# Patient Record
Sex: Female | Born: 1950 | Race: White | Hispanic: No | Marital: Married | Smoking: Never smoker
Health system: Southern US, Community
[De-identification: ages and names within clinical notes are randomized; demographics above are authoritative.]

## PROBLEM LIST (undated history)

## (undated) DIAGNOSIS — I10 Essential (primary) hypertension: Secondary | ICD-10-CM

## (undated) DIAGNOSIS — G43909 Migraine, unspecified, not intractable, without status migrainosus: Secondary | ICD-10-CM

## (undated) HISTORY — PX: CARPAL TUNNEL RELEASE: SHX101

## (undated) HISTORY — PX: HAMMER TOE SURGERY: SHX385

## (undated) HISTORY — DX: Essential (primary) hypertension: I10

## (undated) HISTORY — PX: ABDOMINAL HYSTERECTOMY: SHX81

## (undated) HISTORY — PX: CHOLECYSTECTOMY: SHX55

## (undated) HISTORY — PX: TOTAL ABDOMINAL HYSTERECTOMY W/ BILATERAL SALPINGOOPHORECTOMY: SHX83

---

## 2002-01-01 LAB — HM COLONOSCOPY: HM Colonoscopy: NORMAL

## 2006-11-26 ENCOUNTER — Other Ambulatory Visit: Payer: Self-pay

## 2006-11-26 ENCOUNTER — Emergency Department: Payer: Self-pay | Admitting: Emergency Medicine

## 2007-12-13 ENCOUNTER — Ambulatory Visit: Payer: Self-pay | Admitting: Family Medicine

## 2010-03-19 LAB — HM DEXA SCAN

## 2010-09-01 ENCOUNTER — Ambulatory Visit: Payer: Self-pay | Admitting: Family Medicine

## 2010-09-04 LAB — HM MAMMOGRAPHY: HM Mammogram: NORMAL

## 2011-09-07 ENCOUNTER — Ambulatory Visit: Payer: Self-pay

## 2012-09-04 ENCOUNTER — Ambulatory Visit: Payer: Self-pay

## 2013-02-26 ENCOUNTER — Ambulatory Visit: Payer: Self-pay | Admitting: Unknown Physician Specialty

## 2013-02-26 LAB — HM COLONOSCOPY: HM Colonoscopy: NORMAL

## 2013-05-10 ENCOUNTER — Ambulatory Visit: Payer: Self-pay | Admitting: Podiatry

## 2014-02-25 ENCOUNTER — Ambulatory Visit: Payer: Self-pay

## 2014-06-25 ENCOUNTER — Encounter: Payer: Self-pay | Admitting: Emergency Medicine

## 2014-06-25 ENCOUNTER — Other Ambulatory Visit: Payer: Self-pay

## 2014-06-25 ENCOUNTER — Emergency Department
Admission: EM | Admit: 2014-06-25 | Discharge: 2014-06-25 | Disposition: A | Discharge status: Home / Self Care | Payer: Medicare PPO | Attending: Emergency Medicine | Admitting: Emergency Medicine

## 2014-06-25 DIAGNOSIS — J209 Acute bronchitis, unspecified: Secondary | ICD-10-CM | POA: Diagnosis not present

## 2014-06-25 DIAGNOSIS — R05 Cough: Secondary | ICD-10-CM | POA: Diagnosis present

## 2014-06-25 DIAGNOSIS — R200 Anesthesia of skin: Secondary | ICD-10-CM | POA: Diagnosis not present

## 2014-06-25 LAB — CBC
HCT: 44.1 % (ref 35.0–47.0)
Hemoglobin: 14.5 g/dL (ref 12.0–16.0)
MCH: 28.1 pg (ref 26.0–34.0)
MCHC: 32.9 g/dL (ref 32.0–36.0)
MCV: 85.3 fL (ref 80.0–100.0)
PLATELETS: 222 10*3/uL (ref 150–440)
RBC: 5.17 MIL/uL (ref 3.80–5.20)
RDW: 13 % (ref 11.5–14.5)
WBC: 9.6 10*3/uL (ref 3.6–11.0)

## 2014-06-25 LAB — BASIC METABOLIC PANEL
ANION GAP: 11 (ref 5–15)
BUN: 19 mg/dL (ref 6–20)
CALCIUM: 9.3 mg/dL (ref 8.9–10.3)
CHLORIDE: 105 mmol/L (ref 101–111)
CO2: 23 mmol/L (ref 22–32)
CREATININE: 1.08 mg/dL — AB (ref 0.44–1.00)
GFR calc Af Amer: >60 mL/min (ref >60)
GFR calc non Af Amer: 53 mL/min — ABNORMAL LOW (ref >60)
GLUCOSE: 112 mg/dL — AB (ref 65–99)
Potassium: 3.2 mmol/L — ABNORMAL LOW (ref 3.5–5.1)
Sodium: 139 mmol/L (ref 135–145)

## 2014-06-25 LAB — TROPONIN I: Troponin I: <0.03 ng/mL (ref <0.031)

## 2014-06-25 MED ORDER — AZITHROMYCIN 250 MG PO TABS: ORAL_TABLET | ORAL | Status: AC

## 2014-06-25 MED ORDER — BENZONATATE 100 MG PO CAPS: 100.0000 mg | ORAL_CAPSULE | Freq: Four times a day (QID) | ORAL | Status: DC | PRN

## 2014-06-25 NOTE — ED Notes (Signed)
[facility] .per orders statff she developed left sided facial numbness while getting a breathing treatment.Marland Kitchen.  Hx of panic attacks

## 2014-06-25 NOTE — ED Provider Notes (Signed)
[facility] Emergency Department Provider Note  Time seen: 3:16 PM  I have reviewed the triage vital signs and the nursing notes.   HISTORY  Chief Complaint Numbness    HPI [name] [name] is a 64 y.o. female who presents the emergency department with cough 4 days, which she states is getting worse. Patient was seen at [facility] clinic received an x-ray which looked normal and was given a breathing treatment. During the breathing treatment the patient's that her face felt tingly initially on the left side and then it spread to the right side as well. The physician at [facility] clinic was concerned so they sent her to the ER for further evaluation.Patient here denies any tingling or numbness of the face. States all symptoms have resolved once they stopped the breathing treatment. Patient denies fever, does state moderate cough 3-4 days. History of bronchitis in the past which this feels similar. Patient also does state for the last 2 days she has had a tightness/heaviness feeling in the chest which also concerned her. Describes it as very mild however. No exertional component, no nausea or diaphoresis.    History reviewed. No pertinent past medical history.  There are no active problems to display for this patient.   Past Surgical History  Procedure Laterality Date  . Abdominal hysterectomy      No current outpatient prescriptions on file.  Allergies Codeine  History reviewed. No pertinent family history.  Social History History  Substance Use Topics  . Smoking status: Never Smoker   . Smokeless tobacco: Not on file  . Alcohol Use: No    Review of Systems Constitutional: Negative for fever. Cardiovascular: Mild chest tightness Respiratory: Mild shortness of breath Gastrointestinal: Negative for abdominal pain, Musculoskeletal: Negative for back pain. Neurological: Tingling/numbness sensation to both sides for face during the breathing treatment  which has resolved since the breathing treatment stopped.  10-point ROS otherwise negative.  ____________________________________________   PHYSICAL EXAM:  VITAL SIGNS: ED Triage Vitals  Enc Vitals Group     BP 06/25/14 1305 117/60 mmHg     Pulse Rate 06/25/14 1305 74     Resp 06/25/14 1305 24     Temp 06/25/14 1306 97.7 F (36.5 C)     Temp Source 06/25/14 1306 Oral     SpO2 06/25/14 1305 99 %     Weight 06/25/14 1305 210 lb (95.255 kg)     Height 06/25/14 1305  (1.651 m)     Head Cir --      Peak Flow --      Pain Score --      Pain Loc --      Pain Edu? --      Excl. in GC? --     Constitutional: Alert and oriented. Well appearing and in no distress. Eyes: Normal exam, PERRL ENT   Mouth/Throat: Mucous membranes are moist. Cardiovascular: Normal rate, regular rhythm. No murmurs Respiratory: Normal respiratory effort without tachypnea nor retractions. Breath sounds are clear  Gastrointestinal: Soft and nontender. No distention. Musculoskeletal: Nontender with normal range of motion in all extremities. Neurologic:  Normal speech and language. No gross focal neurologic deficits are appreciated. Speech is normal. Denies any sensory deficits at this time. Skin:  Skin is warm, dry and intact.  Psychiatric: Mood and affect are normal. Speech and behavior are normal.  ____________________________________________    EKG  EKG reviewed and interpreted by myself shows normal sinus rhythm at 76 bpm, narrow QRS, normal  axis, normal intervals, nonspecific but no concerning ST changes noted.  ____________________________________________    INITIAL IMPRESSION / ASSESSMENT AND PLAN / ED COURSE  Pertinent labs & imaging results that were available during my care of the patient were reviewed by me and considered in my medical decision making (see chart for details).  Patient with likely acute bronchitis. Facial tingling likely secondary to albuterol. Denies any  neurologic deficits since the albuterol was stopped. Patient appears very well with a normal physical exam besides a dry cough. We'll treat the patient's acute bronchitis never follow up with her primary care doctor.  ____________________________________________   FINAL CLINICAL IMPRESSION(S) / ED DIAGNOSES  Acute bronchitis Paresthesias   Minna AntisKevin Akshay Spang, MD 06/25/14 1521

## 2014-06-25 NOTE — Discharge Instructions (Signed)

## 2014-07-16 DIAGNOSIS — M810 Age-related osteoporosis without current pathological fracture: Secondary | ICD-10-CM | POA: Insufficient documentation

## 2014-07-16 DIAGNOSIS — F329 Major depressive disorder, single episode, unspecified: Secondary | ICD-10-CM | POA: Insufficient documentation

## 2014-07-16 DIAGNOSIS — E785 Hyperlipidemia, unspecified: Secondary | ICD-10-CM | POA: Insufficient documentation

## 2014-07-16 DIAGNOSIS — J309 Allergic rhinitis, unspecified: Secondary | ICD-10-CM | POA: Insufficient documentation

## 2014-07-16 DIAGNOSIS — N3281 Overactive bladder: Secondary | ICD-10-CM | POA: Insufficient documentation

## 2014-07-16 DIAGNOSIS — F32A Depression, unspecified: Secondary | ICD-10-CM | POA: Insufficient documentation

## 2014-07-16 DIAGNOSIS — I10 Essential (primary) hypertension: Secondary | ICD-10-CM | POA: Insufficient documentation

## 2014-07-19 ENCOUNTER — Ambulatory Visit (INDEPENDENT_AMBULATORY_CARE_PROVIDER_SITE_OTHER): Payer: Medicare PPO | Admitting: Family Medicine

## 2014-07-19 ENCOUNTER — Encounter: Payer: Self-pay | Admitting: Unknown Physician Specialty

## 2014-07-19 VITALS — BP 135/88 | HR 83 | Temp 98.2°F | Ht 63.5 in | Wt 210.0 lb

## 2014-07-19 DIAGNOSIS — J40 Bronchitis, not specified as acute or chronic: Secondary | ICD-10-CM | POA: Diagnosis not present

## 2014-07-19 MED ORDER — DOXYCYCLINE HYCLATE 100 MG PO TABS: 100.0000 mg | ORAL_TABLET | Freq: Two times a day (BID) | ORAL | Status: DC

## 2014-07-19 NOTE — Progress Notes (Signed)
  BP 135/88 mmHg  Pulse 83  Temp(Src) 98.2 F (36.8 C)  Ht 5' 3.5" (1.613 m)  Wt 210 lb (95.255 kg)  BMI 36.61 kg/m2  SpO2 97%   Subjective:    Patient ID: [name] [name], female    DOB: [birth year], 64 y.o.   MRN: [id number]  HPI: [name] [name] is a 64 y.o. female  Chief Complaint  Patient presents with  . URI    has been treated for bronchitis, wants to be rechecked   UPPER RESPIRATORY TRACT INFECTION- had a nasty case of bronchitis and got a breathing treatment, had a bad reaction to it and had to go to the ER for that due to an allergy. Has been sick for 4 weeks, feeling better this week Worst symptom: cough- better now than it was Fever: no Cough: yes Shortness of breath: no Wheezing: yes Chest pain: no Chest tightness: no Chest congestion: no Nasal congestion: yes Runny nose: yes Post nasal drip: no Sneezing: yes Sore throat: yes Swollen glands: yes Sinus pressure: yes Headache: no Face pain: no Toothache: no Ear pain: no  Ear pressure: no  Eyes red/itching:no Eye drainage/crusting: no  Vomiting: no Rash: no Fatigue: no Sick contacts: no Strep contacts: no  Context: better, worse, stable and fluctuating Recurrent sinusitis: no Relief with OTC cold/cough medications: no  Treatments attempted: antibiotics   Relevant past medical, surgical, family and social history reviewed and updated as indicated. Interim medical history since our last visit reviewed. Allergies and medications reviewed and updated.  Review of Systems  Constitutional: Negative.   HENT: Negative.   Respiratory: Negative.   Cardiovascular: Negative.   Psychiatric/Behavioral: Negative.     Per HPI unless specifically indicated above     Objective:    BP 135/88 mmHg  Pulse 83  Temp(Src) 98.2 F (36.8 C)  Ht 5' 3.5" (1.613 m)  Wt 210 lb (95.255 kg)  BMI 36.61 kg/m2  SpO2 97%  Wt Readings from Last 3 Encounters:  07/19/14 210 lb (95.255 kg)  April 05, 2049 212 lb (96.163 kg) (100  %*, Z = 48.93)  06/25/14 210 lb (95.255 kg)   * Growth percentiles are based on WHO (Girls, 0-2 years) data.    Physical Exam  Constitutional: She appears well-developed and well-nourished.  HENT:  Head: Normocephalic and atraumatic.  Cardiovascular: Normal rate, regular rhythm and normal heart sounds.  Exam reveals no gallop and no friction rub.   No murmur heard. Pulmonary/Chest: Effort normal. No respiratory distress. She has no wheezes. She has no rales. She exhibits no tenderness.  Coarse breath sounds at the bases bilaterally  Skin: Skin is warm and dry.  Psychiatric: She has a normal mood and affect. Her behavior is normal. Judgment and thought content normal.  Nursing note and vitals reviewed.       Assessment & Plan:   Problem List Items Addressed This Visit    None    Visit Diagnoses    Bronchitis    -  Primary    Still coughing with course breath sounds. Will start doxycycline. Call if not doing better. Return for lung recheck in 2 weeks.         Follow up plan: Return in about 2 weeks (around 08/02/2014) for Lung recheck.

## 2014-07-19 NOTE — Patient Instructions (Signed)
Cough, Adult   A cough is a reflex. It helps you clear your throat and airways. A cough can help heal your body. A cough can last 2 or 3 weeks (acute) or may last more than 8 weeks (chronic). Some common causes of a cough can include an infection, allergy, or a cold.  HOME CARE  · Only take medicine as told by your doctor.  · If given, take your medicines (antibiotics) as told. Finish them even if you start to feel better.  · Use a cold steam vaporizer or humidifier in your home. This can help loosen thick spit (secretions).  · Sleep so you are almost sitting up (semi-upright). Use pillows to do this. This helps reduce coughing.  · Rest as needed.  · Stop smoking if you smoke.  GET HELP RIGHT AWAY IF:  · You have yellowish-white fluid (pus) in your thick spit.  · Your cough gets worse.  · Your medicine does not reduce coughing, and you are losing sleep.  · You cough up blood.  · You have trouble breathing.  · Your pain gets worse and medicine does not help.  · You have a fever.  MAKE SURE YOU:   · Understand these instructions.  · Will watch your condition.  · Will get help right away if you are not doing well or get worse.  Document Released: 10/01/2010 Document Revised: 06/04/2013 Document Reviewed: 10/01/2010  ExitCare® Patient Information ©2015 [employer]. This information is not intended to replace advice given to you by your health care provider. Make sure you discuss any questions you have with your health care provider.

## 2014-07-22 ENCOUNTER — Telehealth: Payer: Self-pay

## 2014-07-22 NOTE — Telephone Encounter (Signed)
Patient would like to speak with MAC about "incident" this weekend

## 2014-07-23 ENCOUNTER — Emergency Department: Payer: Medicare PPO

## 2014-07-23 ENCOUNTER — Emergency Department
Admission: EM | Admit: 2014-07-23 | Discharge: 2014-07-23 | Disposition: A | Discharge status: Home / Self Care | Payer: Medicare PPO | Attending: Emergency Medicine | Admitting: Emergency Medicine

## 2014-07-23 ENCOUNTER — Ambulatory Visit (INDEPENDENT_AMBULATORY_CARE_PROVIDER_SITE_OTHER): Payer: Medicare PPO | Admitting: Family Medicine

## 2014-07-23 ENCOUNTER — Encounter: Payer: Self-pay | Admitting: Family Medicine

## 2014-07-23 VITALS — BP 137/85 | HR 82 | Temp 98.2°F | Ht 63.2 in | Wt 208.8 lb

## 2014-07-23 DIAGNOSIS — Y9389 Activity, other specified: Secondary | ICD-10-CM | POA: Insufficient documentation

## 2014-07-23 DIAGNOSIS — Y9289 Other specified places as the place of occurrence of the external cause: Secondary | ICD-10-CM | POA: Insufficient documentation

## 2014-07-23 DIAGNOSIS — R079 Chest pain, unspecified: Secondary | ICD-10-CM | POA: Diagnosis present

## 2014-07-23 DIAGNOSIS — R5383 Other fatigue: Secondary | ICD-10-CM

## 2014-07-23 DIAGNOSIS — Y998 Other external cause status: Secondary | ICD-10-CM | POA: Diagnosis not present

## 2014-07-23 DIAGNOSIS — I10 Essential (primary) hypertension: Secondary | ICD-10-CM | POA: Diagnosis not present

## 2014-07-23 DIAGNOSIS — T675XXA Heat exhaustion, unspecified, initial encounter: Secondary | ICD-10-CM | POA: Diagnosis not present

## 2014-07-23 DIAGNOSIS — X30XXXA Exposure to excessive natural heat, initial encounter: Secondary | ICD-10-CM | POA: Diagnosis not present

## 2014-07-23 DIAGNOSIS — R059 Cough, unspecified: Secondary | ICD-10-CM

## 2014-07-23 DIAGNOSIS — R9431 Abnormal electrocardiogram [ECG] [EKG]: Secondary | ICD-10-CM | POA: Diagnosis not present

## 2014-07-23 DIAGNOSIS — R05 Cough: Secondary | ICD-10-CM | POA: Diagnosis not present

## 2014-07-23 DIAGNOSIS — Z79899 Other long term (current) drug therapy: Secondary | ICD-10-CM | POA: Diagnosis not present

## 2014-07-23 DIAGNOSIS — R4182 Altered mental status, unspecified: Secondary | ICD-10-CM | POA: Insufficient documentation

## 2014-07-23 DIAGNOSIS — R404 Transient alteration of awareness: Secondary | ICD-10-CM | POA: Diagnosis not present

## 2014-07-23 DIAGNOSIS — R0789 Other chest pain: Secondary | ICD-10-CM

## 2014-07-23 LAB — CBC
HEMATOCRIT: 43.4 % (ref 35.0–47.0)
Hemoglobin: 14.2 g/dL (ref 12.0–16.0)
MCH: 28.1 pg (ref 26.0–34.0)
MCHC: 32.8 g/dL (ref 32.0–36.0)
MCV: 85.6 fL (ref 80.0–100.0)
Platelets: 213 10*3/uL (ref 150–440)
RBC: 5.07 MIL/uL (ref 3.80–5.20)
RDW: 13.4 % (ref 11.5–14.5)
WBC: 8 10*3/uL (ref 3.6–11.0)

## 2014-07-23 LAB — BASIC METABOLIC PANEL
ANION GAP: 10 (ref 5–15)
BUN: 22 mg/dL — ABNORMAL HIGH (ref 6–20)
CO2: 25 mmol/L (ref 22–32)
Calcium: 9.1 mg/dL (ref 8.9–10.3)
Chloride: 102 mmol/L (ref 101–111)
Creatinine, Ser: 0.92 mg/dL (ref 0.44–1.00)
GFR calc Af Amer: >60 mL/min (ref >60)
GFR calc non Af Amer: >60 mL/min (ref >60)
Glucose, Bld: 94 mg/dL (ref 65–99)
POTASSIUM: 3.5 mmol/L (ref 3.5–5.1)
SODIUM: 137 mmol/L (ref 135–145)

## 2014-07-23 LAB — UA/M W/RFLX CULTURE, ROUTINE
Bilirubin, UA: NEGATIVE
GLUCOSE, UA: NEGATIVE
KETONES UA: NEGATIVE
LEUKOCYTES UA: NEGATIVE
NITRITE UA: NEGATIVE
Protein, UA: NEGATIVE
RBC UA: NEGATIVE
Specific Gravity, UA: 1.03 (ref 1.005–1.030)
Urobilinogen, Ur: 0.2 mg/dL (ref 0.2–1.0)
pH, UA: 5.5 (ref 5.0–7.5)

## 2014-07-23 LAB — BAYER DCA HB A1C WAIVED: HB A1C (BAYER DCA - WAIVED): 5.5 % (ref <7.0)

## 2014-07-23 LAB — TROPONIN I: Troponin I: <0.03 ng/mL (ref <0.031)

## 2014-07-23 MED ORDER — ASPIRIN 325 MG PO TABS: 325.0000 mg | ORAL_TABLET | Freq: Once | ORAL | Status: DC

## 2014-07-23 MED ORDER — DOXYCYCLINE HYCLATE 100 MG PO TABS: 100.0000 mg | ORAL_TABLET | Freq: Two times a day (BID) | ORAL | Status: DC

## 2014-07-23 NOTE — Progress Notes (Signed)
BP 137/85 mmHg  Pulse 82  Temp(Src) 98.2 F (36.8 C)  Ht 5' 3.2" (1.605 m)  Wt 208 lb 12.8 oz (94.711 kg)  BMI 36.77 kg/m2  SpO2 96%   Subjective:    Patient ID: [name] [name], female    DOB: [birth year], 64 y.o.   MRN: [id number]  HPI: [name] [name] is a 64 y.o. female  Chief Complaint  Patient presents with  . Cough    Patient complains of fatigue, vomiting and confusing on Saturday. She can not remember anything about Saturday  . Altered Mental Status   Husband got a call from her son on Saturday. She was very confused. Didn't know what day it was. Doesn't remember anything about that happening. Had been out in sun for about 3-4 hours without eating anything. Nothing like this has ever happened before. Lasted about 3-4 hours. Remembers getting up in the morning, remembers going outside and then remembers nothing until she was woken up in the evening. Her son came over and she had thrown up and said that she was very hot.   Cough Fever: no Cough: yes Shortness of breath: no Wheezing: no Chest pain: yes Chest tightness: yes Chest congestion: yes Nasal congestion: no Runny nose: no Post nasal drip: no Sneezing: no Sore throat: no Swollen glands: no Sinus pressure: no Headache: no Face pain: no Toothache: no Ear pain: no  Ear pressure: no  Eyes red/itching:no Eye drainage/crusting: no  Vomiting: no Rash: no Fatigue: yes Sick contacts: no Strep contacts: no  Context: stable Recurrent sinusitis: no Relief with OTC cold/cough medications: no  Treatments attempted: cold/sinus, mucinex, anti-histamine and antibiotics   CHEST PAIN Time since onset: Duration:today about 2 hours ago Onset: sudden Quality: pressure-like Severity: moderate Location: substernal Radiation: none Episode duration: has been pretty constant for the past 2 hours Frequency: constant Related to exertion: no Activity when pain started: sitting, then went to sleep Trauma:  no Anxiety/recent stressors: yes Status: stable Treatments attempted: nothing  Current pain status: in pain Shortness of breath: yes Cough: yes Nausea: yes Diaphoresis: no Heartburn: no Palpitations: no  Relevant past medical, surgical, family and social history reviewed and updated as indicated. Interim medical history since our last visit reviewed. Allergies and medications reviewed and updated.  Review of Systems  Constitutional: Negative.   HENT: Negative.   Respiratory: Negative.   Cardiovascular: Negative.   Psychiatric/Behavioral: Negative.     Per HPI unless specifically indicated above     Objective:    BP 137/85 mmHg  Pulse 82  Temp(Src) 98.2 F (36.8 C)  Ht 5' 3.2" (1.605 m)  Wt 208 lb 12.8 oz (94.711 kg)  BMI 36.77 kg/m2  SpO2 96%  Wt Readings from Last 3 Encounters:  07/23/14 208 lb 12.8 oz (94.711 kg)  07/19/14 210 lb (95.255 kg)  2049/09/16 212 lb (96.163 kg) (100 %*, Z = 48.93)   * Growth percentiles are based on WHO (Girls, 0-2 years) data.    Physical Exam  Constitutional: She appears well-developed and well-nourished.  HENT:  Head: Normocephalic and atraumatic.  Cardiovascular: Normal rate, regular rhythm and normal heart sounds.  Exam reveals no gallop and no friction rub.   No murmur heard. Pulmonary/Chest: Effort normal and breath sounds normal. No respiratory distress. She has no wheezes. She has no rales. She exhibits no tenderness.  Skin: Skin is warm and dry.  Psychiatric: She has a normal mood and affect. Her behavior is normal. Judgment and thought content normal.  Nursing note and vitals reviewed.   Results for orders placed or performed during the hospital encounter of 06/25/14  CBC  Result Value Ref Range   WBC 9.6 3.6 - 11.0 K/uL   RBC 5.17 3.80 - 5.20 MIL/uL   Hemoglobin 14.5 12.0 - 16.0 g/dL   HCT 16.1 09.6 - 04.5 %   MCV 85.3 80.0 - 100.0 fL   MCH 28.1 26.0 - 34.0 pg   MCHC 32.9 32.0 - 36.0 g/dL   RDW 40.9 81.1 - 91.4 %    Platelets 222 150 - 440 K/uL  Basic metabolic panel  Result Value Ref Range   Sodium 139 135 - 145 mmol/L   Potassium 3.2 (L) 3.5 - 5.1 mmol/L   Chloride 105 101 - 111 mmol/L   CO2 23 22 - 32 mmol/L   Glucose, Bld 112 (H) 65 - 99 mg/dL   BUN 19 6 - 20 mg/dL   Creatinine, Ser 7.82 (H) 0.44 - 1.00 mg/dL   Calcium 9.3 8.9 - 95.6 mg/dL   GFR calc non Af Amer 53 (L) >60 mL/min   GFR calc Af Amer >60 >60 mL/min   Anion gap 11 5 - 15  Troponin I  Result Value Ref Range   Troponin I <0.03 <0.031 ng/mL      Assessment & Plan:   Problem List Items Addressed This Visit      Other   Altered mental status - Primary    Patient had altered mental status for at least 4 hours on Saturday and presents for evaluation for it today. Will check lab work as listed below and obtain EKG. Will need CT of her head- however due to change in EKG, patient is going to ER now, so will hold on CT ordered here. Will hold on neuro consult pending her lab work. Differential includes heat stroke, CVA, TIA, MI, mass effect.       Relevant Orders   Comprehensive metabolic panel   Bayer DCA Hb O1H Waived   TSH   Vit D  25 hydroxy (rtn osteoporosis monitoring)   CBC With Differential/Platelet   CT Head Wo Contrast   UA/M w/rflx Culture, Routine   Chest pain    Patient presents today with chest pain x 2 hours following AMS x 4 hours on Saturday. EKG done in the office today shows ST depression. Patient given  of PO aspirin to chew and rescue was called. Patient to ER for evaluation.       Relevant Medications   aspirin tablet 325 mg (Start on 07/23/2014  2:30 PM)    Other Visit Diagnoses    Cough        only on day 4 of doxy. Lungs improved today. Continue to monitor.         Follow up plan: Return Pending hospital stay.

## 2014-07-23 NOTE — ED Notes (Signed)
Pt arrives by [facility] from PCP in [place] after having CP today. Told that her EKG was abnormal, [facility] was unable to elaborate on that due to not being told about abnormal EKG. Pt dx with bronchitis last week and has been taking medications as direction. Pt alert and oriented X4, active, cooperative, pt in NAD. RR even and unlabored, color WNL.  Denies pain at this time.

## 2014-07-23 NOTE — ED Notes (Signed)
MD at bedside. 

## 2014-07-23 NOTE — Discharge Instructions (Signed)
Please seek medical attention for any high fevers, chest pain, shortness of breath, change in behavior, persistent vomiting, bloody stool or any other new or concerning symptoms.  Heat-Related Illness Heat-related illnesses occur when the body is unable to properly cool itself. The body normally cools itself by sweating. However, under some conditions sweating is not enough. In these cases, a person's body temperature rises rapidly. Very high body temperatures may damage the brain or other vital organs. Some examples of heat-related illnesses include:  Heat stroke. This occurs when the body is unable to regulate its temperature. The body's temperature rises rapidly, the sweating mechanism fails, and the body is unable to cool down. Body temperature may rise to 106 F (41 C) or higher within 10 to 15 minutes. Heat stroke can cause death or permanent disability if emergency treatment is not provided.  Heat exhaustion. This is a milder form of heat-related illness that can develop after several days of exposure to high temperatures and not enough fluids. It is the body's response to an excessive loss of the water and salt contained in sweat.  Heat cramps. These usually affect people who sweat a lot during heavy activity. This sweating drains the body's salt and moisture. The low salt level in the muscles causes painful cramps. Heat cramps may also be a symptom of heat exhaustion. Heat cramps usually occur in the abdomen, arms, or legs. Get medical attention for cramps if you have heart problems or are on a low-sodium diet. Those that are at greatest risk for heat-related illnesses include:   The elderly.  Infant and the very young.  People with mental illness and chronic diseases.  People who are overweight (obese).  Young and healthy people can even succumb to heat if they participate in strenuous physical activities during hot weather. CAUSES  Several factors affect the body's ability to cool  itself during extremely hot weather. When the humidity is high, sweat will not evaporate as quickly. This prevents the body from releasing heat quickly. Other factors that can affect the body's ability to cool down include:   Age.  Obesity.  Fever.  Dehydration.  Heart disease.  Mental illness.  Poor circulation.  Sunburn.  Prescription drug use.  Alcohol use. SYMPTOMS  Heat stroke: Warning signs of heat stroke vary, but may include:  An extremely high body temperature (above 103F orally).  A fast, strong pulse.  Dizziness.  Confusion.  Red, hot, and dry skin.  No sweating.  Throbbing headache.  Feeling sick to your stomach (nauseous).  Unconsciousness. Heat exhaustion: Warning signs of heat exhaustion include:  Heavy sweating.  Tiredness.  Headache.  Paleness.  Weakness.  Feeling sick to your stomach (nauseous) or vomiting.  Muscle cramps. Heat cramps  Muscle pains or spasms. TREATMENT  Heat stroke  Get into a cool environment. An indoor place that is air-conditioned may be best.  Take a cool shower or bath. Have someone around to make sure you are okay.  Take your temperature. Make sure it is going down. Heat exhaustion  Drink plenty of fluids. Do not drink liquids that contain caffeine, alcohol, or large amounts of sugar. These cause you to lose more body fluid. Also, avoid very cold drinks. They can cause stomach cramps.  Get into a cool environment. An indoor place that is air-conditioned may be best.  Take a cool shower or bath. Have someone around to make sure you are okay.  Put on lightweight clothing. Heat cramps  Stop whatever activity you  were doing. Do not attempt to do that activity for at least 3 hours after the cramps have gone away. °· Get into a cool environment. An indoor place that is air-conditioned may be best. °HOME CARE INSTRUCTIONS  °To protect your health when temperatures are extremely high, follow these  tips: °· During heavy exercise in a hot environment, drink two to four glasses (16-32 ounces) of cool fluids each hour. Do not wait until you are thirsty to drink. Warning: If your caregiver limits the amount of fluid you drink or has you on water pills, ask how much you should drink while the weather is hot. °· Do not drink liquids that contain caffeine, alcohol, or large amounts of sugar. These cause you to lose more body fluid. °· Avoid very cold drinks. They can cause stomach cramps. °· Wear appropriate clothing. Choose lightweight, light-colored, loose-fitting clothing. °· If you must be outdoors, try to limit your outdoor activity to morning and evening hours. Try to rest often in shady areas. °· If you are not used to working or exercising in a hot environment, start slowly and pick up the pace gradually. °· Stay cool in an air-conditioned place if possible. If your home does not have air conditioning, go to the shopping mall or public library. °· Taking a cool shower or bath may help you cool off. °SEEK MEDICAL CARE IF:  °· You see any of the symptoms listed above. You may be dealing with a life-threatening emergency. °· Symptoms worsen or last longer than 1 hour. °· Heat cramps do not get better in 1 hour. °MAKE SURE YOU:  °· Understand these instructions. °· Will watch your condition. °· Will get help right away if you are not doing well or get worse. °Document Released: 10/28/2007 Document Revised: 04/12/2011 Document Reviewed: 10/28/2007 °ExitCare® Patient Information ©2015 [employer]. This information is not intended to replace advice given to you by your health care provider. Make sure you discuss any questions you have with your health care provider. ° °

## 2014-07-23 NOTE — Patient Instructions (Addendum)
If you become confused again- go IMMEDIATELY to the emergency room for evaluation.  We checked your blood work today, but much of it will not come back until tomorrow. We will call you with the results.  You will need a CT of your head to make sure you didn't have a stroke or a mini-stroke. We are getting this scheduled for you. You may need to see the neurologist- this will depend on the results of your blood work. We will discuss this by phone tomorrow.  This could be many different things from problems with your electrolytes, to a stroke or mini-stroke, to heat stroke, but there are things we need to check on and make sure it was not.  Keep taking your antibiotics until you're done. It can make you more sensitive to the sun, so try to stay out of it for the next several days.   Confusion Confusion is the inability to think with your usual speed or clarity. Confusion may come on quickly or slowly over time. How quickly the confusion comes on depends on the cause. Confusion can be due to any number of causes. CAUSES   Concussion, head injury, or head trauma.  Seizures.  Stroke.  Fever.  Brain tumor.  Age related decreased brain function (dementia).  Heightened emotional states like rage or terror.  Mental illness in which the person loses the ability to determine what is real and what is not (hallucinations).  Infections such as a urinary tract infection (UTI).  Toxic effects from alcohol, drugs, or prescription medicines.  Dehydration and an imbalance of salts in the body (electrolytes).  Lack of sleep.  Low blood sugar (diabetes).  Low levels of oxygen from conditions such as chronic lung disorders.  Drug interactions or other medicine side effects.  Nutritional deficiencies, especially niacin, thiamine, vitamin C, or vitamin B.  Sudden drop in body temperature (hypothermia).  Change in routine, such as when traveling or hospitalized. SIGNS AND SYMPTOMS  People often  describe their thinking as cloudy or unclear when they are confused. Confusion can also include feeling disoriented. That means you are unaware of where or who you are. You may also not know what the date or time is. If confused, you may also have difficulty paying attention, remembering, and making decisions. Some people also act aggressively when they are confused.  DIAGNOSIS  The medical evaluation of confusion may include:  Blood and urine tests.  X-rays.  Brain and nervous system tests.  Analyzing your brain waves (electroencephalogram or EEG).  Magnetic resonance imaging (MRI) of your head.  Computed tomography (CT) scan of your head.  Mental status tests in which your health care provider may ask many questions. Some of these questions may seem silly or strange, but they are a very important test to help diagnose and treat confusion. TREATMENT  An admission to the hospital may not be needed, but a person with confusion should not be left alone. Stay with a family member or friend until the confusion clears. Avoid alcohol, pain relievers, or sedative drugs until you have fully recovered. Do not drive until directed by your health care provider. HOME CARE INSTRUCTIONS  What family and friends can do:  To find out if someone is confused, ask the person to state his or her name, age, and the date. If the person is unsure or answers incorrectly, he or she is confused.  Always introduce yourself, no matter how well the person knows you.  Often remind the person  of his or her location.  Place a calendar and clock near the confused person.  Help the person with his or her medicines. You may want to use a pill box, an alarm as a reminder, or give the person each dose as prescribed.  Talk about current events and plans for the day.  Try to keep the environment calm, quiet, and peaceful.  Make sure the person keeps follow-up visits with his or her health care provider. PREVENTION    Ways to prevent confusion:  Avoid alcohol.  Eat a balanced diet.  Get enough sleep.  Take medicine only as directed by your health care provider.  Do not become isolated. Spend time with other people and make plans for your days.  Keep careful watch on your blood sugar levels if you are diabetic. SEEK IMMEDIATE MEDICAL CARE IF:   You develop severe headaches, repeated vomiting, seizures, blackouts, or slurred speech.  There is increasing confusion, weakness, numbness, restlessness, or personality changes.  You develop a loss of balance, have marked dizziness, feel uncoordinated, or fall.  You have delusions, hallucinations, or develop severe anxiety.  Your family members think you need to be rechecked. Document Released: 02/26/2004 Document Revised: 06/04/2013 Document Reviewed: 02/23/2013 [facility] Patient Information 2015 [place], [place]. This information is not intended to replace advice given to you by your health care provider. Make sure you discuss any questions you have with your health care provider. Chronic Kidney Disease Chronic kidney disease occurs when the kidneys are damaged over a long period. The kidneys are two organs that lie on either side of the spine between the middle of the back and the front of the abdomen. The kidneys:   Remove wastes and extra water from the blood.   Produce important hormones. These help keep bones strong, regulate blood pressure, and help create red blood cells.   Balance the fluids and chemicals in the blood and tissues. A small amount of kidney damage may not cause problems, but a large amount of damage may make it difficult or impossible for the kidneys to work the way they should. If steps are not taken to slow down the kidney damage or stop it from getting worse, the kidneys may stop working permanently. Most of the time, chronic kidney disease does not go away. However, it can often be controlled, and those with the disease can  usually live normal lives. CAUSES  The most common causes of chronic kidney disease are diabetes and high blood pressure (hypertension). Chronic kidney disease may also be caused by:   Diseases that cause the kidneys' filters to become inflamed.   Diseases that affect the immune system.   Genetic diseases.   Medicines that damage the kidneys, such as anti-inflammatory medicines.  Poisoning or exposure to toxic substances.   A reoccurring kidney or urinary infection.   A problem with urine flow. This may be caused by:   Cancer.   Kidney stones.   An enlarged prostate in males. SIGNS AND SYMPTOMS  Because the kidney damage in chronic kidney disease occurs slowly, symptoms develop slowly and may not be obvious until the kidney damage becomes severe. A person may have a kidney disease for years without showing any symptoms. Symptoms can include:   Swelling (edema) of the legs, ankles, or feet.   Tiredness (lethargy).   Nausea or vomiting.   Confusion.   Problems with urination, such as:   Decreased urine production.   Frequent urination, especially at night.   Frequent  accidents in children who are potty trained.   Muscle twitches and cramps.   Shortness of breath.  Weakness.   Persistent itchiness.   Loss of appetite.  Metallic taste in the mouth.  Trouble sleeping.  Slowed development in children.  Short stature in children. DIAGNOSIS  Chronic kidney disease may be detected and diagnosed by tests, including blood, urine, imaging, or kidney biopsy tests.  TREATMENT  Most chronic kidney diseases cannot be cured. Treatment usually involves relieving symptoms and preventing or slowing the progression of the disease. Treatment may include:   A special diet. You may need to avoid alcohol and foods thatare salty and high in potassium.   Medicines. These may:   Lower blood pressure.   Relieve anemia.   Relieve swelling.    Protect the bones. HOME CARE INSTRUCTIONS   Follow your prescribed diet.   Take medicines only as directed by your health care provider. Do not take any new medicines (prescription, over-the-counter, or nutritional supplements) unless approved by your health care provider. Many medicines can worsen your kidney damage or need to have the dose adjusted.   Quit smoking if you smoke. Talk to your health care provider about a smoking cessation program.   Keep all follow-up visits as directed by your health care provider. SEEK IMMEDIATE MEDICAL CARE IF:  Your symptoms get worse or you develop new symptoms.   You develop symptoms of end-stage kidney disease. These include:   Headaches.   Abnormally dark or light skin.   Numbness in the hands or feet.   Easy bruising.   Frequent hiccups.   Menstruation stops.   You have a fever.   You have decreased urine production.   You havepain or bleeding when urinating. MAKE SURE YOU:  Understand these instructions.  Will watch your condition.  Will get help right away if you are not doing well or get worse. FOR MORE INFORMATION   American Association of Kidney Patients: ResidentialShow.is  National Kidney Foundation: www.kidney.org  American Kidney Fund: FightingMatch.com.ee  [facility]: www.lifeoptions.org and www.kidneyschool.org Document Released: 10/28/2007 Document Revised: 06/04/2013 Document Reviewed: 09/17/2011 [facility] Patient Information 2015 [place], [place]. This information is not intended to replace advice given to you by your health care provider. Make sure you discuss any questions you have with your health care provider. High Blood Sugar High blood sugar (hyperglycemia) means that the level of sugar in your blood is higher than it should be. Signs of high blood sugar include:  Feeling thirsty.  Frequent peeing (urinating).  Feeling tired or sleepy.  Dry mouth.  Vision  changes.  Feeling weak.  Feeling hungry but losing weight.  Numbness and tingling in your hands or feet.  Headache. When you ignore these signs, your blood sugar may keep going up. These problems may get worse, and other problems may begin. HOME CARE  Check your blood sugars as told by your doctor. Write down the numbers with the date and time.  Take the right amount of insulin or diabetes pills at the right time. Write down the dose with date and time.  Refill your insulin or diabetes pills before running out.  Watch what you eat. Follow your meal plan.  Drink liquids without sugar, such as water. Check with your doctor if you have kidney or heart disease.  Follow your doctor's orders for exercise. Exercise at the same time of day.  Keep your doctor's appointments. GET HELP RIGHT AWAY IF:   You have trouble thinking or  are confused.  You have fast breathing with fruity smelling breath.  You pass out (faint).  You have 2 to 3 days of high blood sugars and you do not know why.  You have chest pain.  You are feeling sick to your stomach (nauseous) or throwing up (vomiting).  You have sudden vision changes. MAKE SURE YOU:   Understand these instructions.  Will watch your condition.  Will get help right away if you are not doing well or get worse. Document Released: 11/15/2008 Document Revised: 04/12/2011 Document Reviewed: 11/15/2008 [facility] Patient Information 2015 [place], [place]. This information is not intended to replace advice given to you by your health care provider. Make sure you discuss any questions you have with your health care provider.

## 2014-07-23 NOTE — Assessment & Plan Note (Signed)
Patient presents today with chest pain x 2 hours following AMS x 4 hours on Saturday. EKG done in the office today shows ST depression. Patient given 324mg  of PO aspirin to chew and rescue was called. Patient to ER for evaluation.

## 2014-07-23 NOTE — Assessment & Plan Note (Addendum)
Patient had altered mental status for at least 4 hours on Saturday and presents for evaluation for it today. Will check lab work as listed below and obtain EKG. Will need CT of her head- however due to change in EKG, patient is going to ER now, so will hold on CT ordered here. Will hold on neuro consult pending her lab work. Differential includes heat stroke, CVA, TIA, MI, mass effect.

## 2014-07-23 NOTE — ED Provider Notes (Signed)
[facility] Emergency Department Provider Note  ____________________________________________  Time seen: 1510  I have reviewed the triage vital signs and the nursing notes.   HISTORY  Chief Complaint Chest Pain and Abnormal ECG   History limited by: Not Limited   HPI [name] [name] is a 64 y.o. female presents to the emergency department today from primary care physician's office because of concern for not feeling well, chest pain and abnormal EKG. Patient states that she has not been feeling well for the past 3 weeks to a month. She was recently diagnosed with bronchitis and is been on antibiotics for 4 days. Patient states that 3 days ago she had a roughly 4 hour episode of altered mental status. She had been working outside in the hot barn and when she came in her son noticed that she was drenched with sweat. She started acting confused had some slurred speech and repetitive questioning. This has now resolved. Today the patient states she had 2 brief episodes of pressure-like chest pain that woke her up this morning. She denies any radiation of the pain or shortness of breath associated with the pain. The patient states she has also felt tired for the past number of months. On discussion of this parent that the patient is not compliant with her CPAP. Patient has not had any recent fevers.     Past Medical History  Diagnosis Date  . Hypertension partial    Patient Active Problem List   Diagnosis Date Noted  . Altered mental status 07/23/2014  . Chest pain 07/23/2014  . Depression 07/16/2014  . Hypertension 07/16/2014  . Hyperlipidemia 07/16/2014  . Allergic rhinitis 07/16/2014  . Overactive bladder 07/16/2014  . Osteoporosis 07/16/2014    Past Surgical History  Procedure Laterality Date  . Abdominal hysterectomy    . Cholecystectomy    . Carpal tunnel release    . Hammer toe surgery Bilateral   . Cesarean section      Current Outpatient Rx   Name  Route  Sig  Dispense  Refill  . buPROPion (WELLBUTRIN XL) 300 MG 24 hr tablet   Oral   Take 300 mg by mouth daily.         Marland Kitchen doxycycline (VIBRA-TABS) 100 MG tablet   Oral   Take 1 tablet (100 mg total) by mouth 2 (two) times daily.   20 tablet   0   . hydrochlorothiazide (HYDRODIURIL) 25 MG tablet   Oral   Take 25 mg by mouth daily.         Marland Kitchen venlafaxine XR (EFFEXOR-XR) 37.5 MG 24 hr capsule   Oral   Take 37.5 mg by mouth daily with breakfast.           Allergies Codeine and Tussionex pennkinetic er  Family History  Problem Relation Age of Onset  . Heart disease Mother   . Heart attack Father   . Hypertension Father   . Cancer Paternal Aunt     Social History History  Substance Use Topics  . Smoking status: Passive Smoke Exposure - Never Smoker  . Smokeless tobacco: Never Used  . Alcohol Use: No    Review of Systems  Constitutional: Negative for fever. Cardiovascular: Positive for chest pain. Respiratory: Negative for shortness of breath. Gastrointestinal: Negative for abdominal pain, vomiting and diarrhea. Genitourinary: Negative for dysuria. Musculoskeletal: Negative for back pain. Skin: Negative for rash. Neurological: Altered mental status 4 days ago   10-point ROS otherwise negative.  ____________________________________________  PHYSICAL EXAM:  VITAL SIGNS: ED Triage Vitals  Enc Vitals Group     BP 07/23/14 1445 147/103 mmHg     Pulse Rate 07/23/14 1445 86     Resp 07/23/14 1445 20     Temp 07/23/14 1445 98.2 F (36.8 C)     Temp Source 07/23/14 1445 Oral     SpO2 07/23/14 1445 95 %     Weight 07/23/14 1445 208 lb (94.348 kg)     Height 07/23/14 1445 5\' 3"  (1.6 m)     Head Cir --      Peak Flow --      Pain Score 07/23/14 1446 0   Constitutional: Alert and oriented. Well appearing and in no distress. Eyes: Conjunctivae are normal. PERRL. Normal extraocular movements. ENT   Head: Normocephalic and atraumatic.    Nose: No congestion/rhinnorhea.   Mouth/Throat: Mucous membranes are moist.   Neck: No stridor. Hematological/Lymphatic/Immunilogical: No cervical lymphadenopathy. Cardiovascular: Normal rate, regular rhythm.  No murmurs, rubs, or gallops. Respiratory: Normal respiratory effort without tachypnea nor retractions. Breath sounds are clear and equal bilaterally. No wheezes/rales/rhonchi. Gastrointestinal: Soft and nontender. No distention. There is no CVA tenderness. Genitourinary: Deferred Musculoskeletal: Normal range of motion in all extremities. No joint effusions.  No lower extremity tenderness nor edema. Neurologic:  Normal speech and language. No gross focal neurologic deficits are appreciated. Speech is normal.  Skin:  Skin is warm, dry and intact. No rash noted. Psychiatric: Mood and affect are normal. Speech and behavior are normal. Patient exhibits appropriate insight and judgment.  ____________________________________________    LABS (pertinent positives/negatives)  Labs Reviewed  BASIC METABOLIC PANEL - Abnormal; Notable for the following:    BUN 22 (*)    All other components within normal limits  CBC  TROPONIN I     ____________________________________________   EKG  I, Phineas Semen, attending physician, personally viewed and interpreted this EKG  EKG Time: 1445 Rate: 77 Rhythm: NSR Axis: normal Intervals: qtc 461 QRS: 114 ST changes: no st elevation    ____________________________________________    RADIOLOGY  CXR: IMPRESSION: Enlargement of cardiac silhouette.  No acute abnormalities.  CT head: IMPRESSION: 1. No acute intracranial abnormalities. 2. Partial opacification of the left maxillary sinus.  ____________________________________________   PROCEDURES  Procedure(s) performed: None  Critical Care performed: No  ____________________________________________   INITIAL IMPRESSION / ASSESSMENT AND PLAN / ED  COURSE  Pertinent labs & imaging results that were available during my care of the patient were reviewed by me and considered in my medical decision making (see chart for details).  Patient here from PCPs office today for multiple complaints. In terms of the altered mental status that happened 4 days ago I think heat stroke/exhaustion most likely given clinical history of being outside working in the hot sun for hours. Additionally patient has resolution of altered mental status since then. However will check a head CT to rule out any obvious intracranial process. In terms of the chest pain think ACS unlikely however will get a troponin. Given that the pain happened early this morning about the troponin should be positive the patient suffered true heart cell injury.  ----------------------------------------- 3:51 PM on 07/23/2014 -----------------------------------------  CT head negative. Workup without any concerning findings. Encourage patient to begin using her CPAP machine regularly as I feel this will help significantly with her fatigue. Discussed return precautions. Her primary care follow-up.  ____________________________________________   FINAL CLINICAL IMPRESSION(S) / ED DIAGNOSES  Final diagnoses:  Other fatigue  Heat exhaustion, initial encounter     Phineas Semen, MD 07/23/14 [phone]

## 2014-07-23 NOTE — ED Notes (Signed)
Pt alert and oriented X4, active, cooperative, pt in NAD. RR even and unlabored, color WNL.  Pt family informed to return with patient if any life threatening symptoms occur.  

## 2014-07-24 ENCOUNTER — Telehealth: Payer: Self-pay | Admitting: Family Medicine

## 2014-07-24 ENCOUNTER — Telehealth: Payer: Self-pay

## 2014-07-24 LAB — COMPREHENSIVE METABOLIC PANEL
ALK PHOS: 92 IU/L (ref 39–117)
ALT: 23 IU/L (ref 0–32)
AST: 21 IU/L (ref 0–40)
Albumin/Globulin Ratio: 2 (ref 1.1–2.5)
Albumin: 4.3 g/dL (ref 3.6–4.8)
BILIRUBIN TOTAL: 0.3 mg/dL (ref 0.0–1.2)
BUN/Creatinine Ratio: 27 — ABNORMAL HIGH (ref 11–26)
BUN: 21 mg/dL (ref 8–27)
CO2: 28 mmol/L (ref 18–29)
CREATININE: 0.77 mg/dL (ref 0.57–1.00)
Calcium: 9.6 mg/dL (ref 8.7–10.3)
Chloride: 99 mmol/L (ref 97–108)
GFR calc non Af Amer: 81 mL/min/{1.73_m2} (ref >59)
GFR, EST AFRICAN AMERICAN: 94 mL/min/{1.73_m2} (ref >59)
GLUCOSE: 85 mg/dL (ref 65–99)
Globulin, Total: 2.2 g/dL (ref 1.5–4.5)
Potassium: 3.5 mmol/L (ref 3.5–5.2)
Sodium: 142 mmol/L (ref 134–144)
Total Protein: 6.5 g/dL (ref 6.0–8.5)

## 2014-07-24 LAB — CBC WITH DIFFERENTIAL/PLATELET
BASOS ABS: 0 10*3/uL (ref 0.0–0.2)
Basos: 1 %
EOS (ABSOLUTE): 0.1 10*3/uL (ref 0.0–0.4)
Eos: 2 %
HEMATOCRIT: 43.7 % (ref 34.0–46.6)
Hemoglobin: 14.5 g/dL (ref 11.1–15.9)
IMMATURE GRANULOCYTES: 0 %
Immature Grans (Abs): 0 10*3/uL (ref 0.0–0.1)
LYMPHS: 26 %
Lymphocytes Absolute: 2 10*3/uL (ref 0.7–3.1)
MCH: 28.2 pg (ref 26.6–33.0)
MCHC: 33.2 g/dL (ref 31.5–35.7)
MCV: 85 fL (ref 79–97)
MONOCYTES: 7 %
Monocytes Absolute: 0.5 10*3/uL (ref 0.1–0.9)
NEUTROS ABS: 4.8 10*3/uL (ref 1.4–7.0)
Neutrophils: 64 %
Platelets: 216 10*3/uL (ref 150–379)
RBC: 5.14 x10E6/uL (ref 3.77–5.28)
RDW: 14 % (ref 12.3–15.4)
WBC: 7.5 10*3/uL (ref 3.4–10.8)

## 2014-07-24 LAB — SPECIMEN STATUS REPORT

## 2014-07-24 LAB — TSH: TSH: 1.1 u[IU]/mL (ref 0.450–4.500)

## 2014-07-24 LAB — VITAMIN D 25 HYDROXY (VIT D DEFICIENCY, FRACTURES): Vit D, 25-Hydroxy: 12 ng/mL — ABNORMAL LOW (ref 30.0–100.0)

## 2014-07-24 NOTE — Telephone Encounter (Signed)
Called patient to see how she was doing after ER visit. Labs normal except vitamin D. Will call again tomorrow.

## 2014-07-24 NOTE — Telephone Encounter (Signed)
Patient was returning your call, I notified her of the lab results and asked her how she was doing since the ER, she states that she is good, still tired. They diagnosed her with heat exhaustion, and that it would take a while for her to feel better. She would like to speak with you, please return her call.

## 2014-07-25 NOTE — Telephone Encounter (Signed)
Called and spoke to [place]. She notes that she had heat exhaustion. She is still feeling very tired. Results of labwork given including low vitamin D. She has started a MVI. As long as 1000IU of vitamin D a day, she's good. She will call if not better by next week.

## 2014-07-29 ENCOUNTER — Ambulatory Visit (INDEPENDENT_AMBULATORY_CARE_PROVIDER_SITE_OTHER): Payer: Medicare PPO | Admitting: Family Medicine

## 2014-07-29 ENCOUNTER — Telehealth: Payer: Self-pay | Admitting: Family Medicine

## 2014-07-29 ENCOUNTER — Encounter: Payer: Self-pay | Admitting: Family Medicine

## 2014-07-29 VITALS — BP 124/70 | HR 86 | Temp 98.2°F | Ht 62.5 in | Wt 209.2 lb

## 2014-07-29 DIAGNOSIS — Z87828 Personal history of other (healed) physical injury and trauma: Secondary | ICD-10-CM

## 2014-07-29 DIAGNOSIS — Z9189 Other specified personal risk factors, not elsewhere classified: Secondary | ICD-10-CM

## 2014-07-29 DIAGNOSIS — J40 Bronchitis, not specified as acute or chronic: Secondary | ICD-10-CM | POA: Diagnosis not present

## 2014-07-29 NOTE — Progress Notes (Signed)
BP 124/70 mmHg  Pulse 86  Temp(Src) 98.2 F (36.8 C) (Oral)  Ht 5' 2.5" (1.588 m)  Wt 209 lb 3.2 oz (94.892 kg)  BMI 37.63 kg/m2  SpO2 95%   Subjective:    Patient ID: [name] [name], female    DOB: 23-Mar-1950, 64 y.o.   MRN: [id number]  HPI: [name] [name] is a 64 y.o. female  Chief Complaint  Patient presents with  . Follow-up   Not feeling great in the morning, but feeling better in the afternoon.   Bronchitis Lung recheck- just about done with her antibiotic. Feels loosed up. Not coughing anymore.  Fever: no Cough: no Shortness of breath: no Wheezing: no Chest pain: no Chest tightness: yes Chest congestion: no Nasal congestion: no Runny nose: no Post nasal drip: no Sneezing: yes Sore throat: no Swollen glands: no Sinus pressure: no Headache: no Face pain: no Toothache: no Ear pain: no  Ear pressure: no  Eyes red/itching:no Eye drainage/crusting: no  Vomiting: no Rash: no Fatigue: yes Sick contacts: no Strep contacts: no  Context: better Recurrent sinusitis: no  Relevant past medical, surgical, family and social history reviewed and updated as indicated. Interim medical history since our last visit reviewed. Allergies and medications reviewed and updated.  Review of Systems  Constitutional: Negative.   HENT: Negative.   Respiratory: Negative.   Cardiovascular: Negative.   Endocrine: Negative.   Psychiatric/Behavioral: Negative.    Per HPI unless specifically indicated above     Objective:    BP 124/70 mmHg  Pulse 86  Temp(Src) 98.2 F (36.8 C) (Oral)  Ht 5' 2.5" (1.588 m)  Wt 209 lb 3.2 oz (94.892 kg)  BMI 37.63 kg/m2  SpO2 95%  Wt Readings from Last 3 Encounters:  07/29/14 209 lb 3.2 oz (94.892 kg)  07/23/14 208 lb (94.348 kg)  07/23/14 208 lb 12.8 oz (94.711 kg)    Physical Exam  Constitutional: She is oriented to person, place, and time. She appears well-developed and well-nourished. No distress.  HENT:  Head: Normocephalic and  atraumatic.  Right Ear: Hearing normal.  Left Ear: Hearing normal.  Nose: Nose normal.  Eyes: Conjunctivae and lids are normal. Right eye exhibits no discharge. Left eye exhibits no discharge. No scleral icterus.  Cardiovascular: Normal rate and regular rhythm.  Exam reveals no gallop and no friction rub.   No murmur heard. Pulmonary/Chest: Effort normal. No respiratory distress. She has no wheezes. She has no rales. She exhibits no tenderness.  Musculoskeletal: Normal range of motion.  Neurological: She is alert and oriented to person, place, and time.  Skin: Skin is warm, dry and intact. No rash noted. No erythema. No pallor.  Psychiatric: She has a normal mood and affect. Her speech is normal and behavior is normal. Judgment and thought content normal. Cognition and memory are normal.  Nursing note and vitals reviewed.   Results for orders placed or performed during the hospital encounter of 07/23/14  CBC  Result Value Ref Range   WBC 8.0 3.6 - 11.0 K/uL   RBC 5.07 3.80 - 5.20 MIL/uL   Hemoglobin 14.2 12.0 - 16.0 g/dL   HCT 40.943.4 81.135.0 - 91.447.0 %   MCV 85.6 80.0 - 100.0 fL   MCH 28.1 26.0 - 34.0 pg   MCHC 32.8 32.0 - 36.0 g/dL   RDW 78.213.4 95.611.5 - 21.314.5 %   Platelets 213 150 - 440 K/uL  Troponin I  Result Value Ref Range   Troponin I <0.03 <0.031 ng/mL  Basic metabolic panel  Result Value Ref Range   Sodium 137 135 - 145 mmol/L   Potassium 3.5 3.5 - 5.1 mmol/L   Chloride 102 101 - 111 mmol/L   CO2 25 22 - 32 mmol/L   Glucose, Bld 94 65 - 99 mg/dL   BUN 22 (H) 6 - 20 mg/dL   Creatinine, Ser 5.95 0.44 - 1.00 mg/dL   Calcium 9.1 8.9 - 63.8 mg/dL   GFR calc non Af Amer >60 >60 mL/min   GFR calc Af Amer >60 >60 mL/min   Anion gap 10 5 - 15      Assessment & Plan:   Problem List Items Addressed This Visit      Other   History of heat stroke    Went to ER with negative work up and was diagnosed with heat stroke. Likely exacerbated by her doxycyline and her HCTZ. Continue  avoiding heat and drinking plenty of water. Call if not feeling better. Will obtain stress test when she is feeling a little better given previous chest pain with abnormal EKG. Continue to monitor.        Other Visit Diagnoses    Bronchitis    -  Primary    Lungs clear on recheck. Finish 2 pills of doxycycline. Continue to monitor. Call with any increased problems.         Follow up plan: Return in about 3 months (around 10/29/2014).

## 2014-07-29 NOTE — Assessment & Plan Note (Signed)
Went to ER with negative work up and was diagnosed with heat stroke. Likely exacerbated by her doxycyline and her HCTZ. Continue avoiding heat and drinking plenty of water. Call if not feeling better. Will obtain stress test when she is feeling a little better given previous chest pain with abnormal EKG. Continue to monitor.

## 2014-07-29 NOTE — Telephone Encounter (Signed)
Pt still not feeling better, would like to talk to Dr Laural BenesJohnson.

## 2014-07-29 NOTE — Patient Instructions (Signed)
Heat Stress in the Elderly  °Elderly people (people aged 65 years and older) are more prone to heat stress than younger people for several reasons:  °· Elderly people do not adjust as well as young people to sudden changes in temperature. °· They are more likely to have a chronic medical condition that upsets normal body responses to heat. °· They are more likely to take prescription medicines that impair the body's ability to regulate its temperature or that inhibit perspiration. °HEAT STROKE  °Heat stroke is the most serious heat-related illness. It occurs when the body becomes unable to control its temperature. The body temperature rises rapidly. Then the body loses its ability to sweat and is unable to cool down. The body temperature rises to 105° F (40.6° C) or higher within 10 to 15 minutes. Heat stroke can cause death or permanent disability if emergency treatment is not provided. °SYMPTOMS  °Warning signs vary but may include the following: °· An extremely high body temperature (above 103° F (39.4° C)). °· Nausea. °· Red, hot, and dry skin (no sweating). °· Rapid, strong pulse. °· Throbbing headache. °· Dizziness. °HEAT EXHAUSTION  °Heat exhaustion is a milder form of heat-related illness. It can develop after several days of exposure to high temperatures and not enough fluids. °SYMPTOMS  °Warning signs vary but may include the following:  °· Heavy sweating. Paleness. °· Muscle cramps. °· Tiredness. Weakness. °· Dizziness. °· Headache. Nausea or vomiting. °· Fainting. °· Skin: may be cool and moist. °· Pulse rate: fast and weak. °· Breathing: fast and shallow. °WHAT YOU CAN DO TO PROTECT YOURSELF  °You can follow these prevention tips to protect yourself from heat-related stress:  °· Drink cool, nonalcoholic, non-caffeinated beverages. If your caregiver generally limits the amount of fluid you drink or has you on water pills, ask how much you should drink when the weather is hot. Avoid extremely cold  liquids. They can cause cramps. °· Rest. °· Take a cool shower, bath, or sponge bath. °· If possible, seek an air-conditioned environment. If you do not have air conditioning, visit an air-conditioned shopping mall or public library to cool off. °· Wear lightweight clothing. °· If possible, remain indoors in the heat of the day. °· Do not engage in strenuous activities. °WHAT YOU CAN DO TO HELP PROTECT ELDERLY RELATIVES AND NEIGHBORS  °If you have elderly relatives or neighbors, help them protect themselves from heat-related stress.  °· Visit older adults at risk at least twice a day. Watch them for signs of heat exhaustion or heat stroke. °· Take them to air-conditioned locations if they have transportation problems. °· Make sure older adults have access to an electric fan whenever possible. °WHAT YOU CAN DO FOR SOMEONE WITH HEAT STRESS  °· If you see any signs of severe heat stress, you may be dealing with a life-threatening emergency. Have someone call for immediate medical assistance while you begin cooling the affected person. Do the following: °· Get the person to a shady area. °· Cool the person rapidly, using whatever methods you can. For example, immerse the person in a tub of cool water or place the person in a cool shower. Spray the person with cool water from a garden hose or sponge the person with cool water. If the humidity is low, wrap the person in a cool, wet sheet. Fan him/her quickly. °· Monitor body temperature. Continue cooling efforts until the body temperature drops to 101° - 102°F (38.3 ° C - 38.9 °   C). °· If emergency medical personnel are delayed, call the hospital emergency room for further instructions. °· Do not give the person alcohol to drink. °· Get medical care as soon as possible. °Document Released: 01/06/2009 Document Revised: 04/12/2011 Document Reviewed: 01/06/2009 °[employer]® Patient Information ©2015 [employer]. This information is not intended to replace advice given to you  by your health care provider. Make sure you discuss any questions you have with your health care provider. ° °

## 2014-07-30 NOTE — Telephone Encounter (Signed)
Called and LMOM. Will try her again tomorrow.  

## 2014-07-31 NOTE — Telephone Encounter (Signed)
Called and LMOM. Will try her again tomorrow.

## 2014-08-12 ENCOUNTER — Ambulatory Visit: Payer: Medicare PPO | Admitting: Family Medicine

## 2014-09-23 ENCOUNTER — Ambulatory Visit: Payer: Medicare PPO | Admitting: Family Medicine

## 2014-09-26 ENCOUNTER — Telehealth: Payer: Self-pay | Admitting: Family Medicine

## 2014-09-26 NOTE — Telephone Encounter (Signed)
Still not feeling great and wants to know when she is going to feel great. Has an appointment next week. Discussed that we will check in on this next week when she comes in.

## 2014-09-26 NOTE — Telephone Encounter (Signed)
Called and left a message for patient, if she still needs [place] to call us back.

## 2014-09-26 NOTE — Telephone Encounter (Signed)
Patient states that she missed a call from you yesterday, she would like to speak with you.

## 2014-09-26 NOTE — Telephone Encounter (Signed)
Pt called would like a call back from Dr. Laural Benes. Thanks.

## 2014-10-02 ENCOUNTER — Ambulatory Visit: Payer: Medicare PPO | Admitting: Family Medicine

## 2014-10-18 ENCOUNTER — Encounter: Payer: Self-pay | Admitting: Family Medicine

## 2014-10-18 ENCOUNTER — Ambulatory Visit (INDEPENDENT_AMBULATORY_CARE_PROVIDER_SITE_OTHER): Payer: Medicare PPO | Admitting: Family Medicine

## 2014-10-18 VITALS — BP 150/82 | HR 76 | Temp 98.6°F | Wt 210.0 lb

## 2014-10-18 DIAGNOSIS — R413 Other amnesia: Secondary | ICD-10-CM

## 2014-10-18 DIAGNOSIS — Z23 Encounter for immunization: Secondary | ICD-10-CM

## 2014-10-18 NOTE — Progress Notes (Signed)
BP 150/82 mmHg  Pulse 76  Temp(Src) 98.6 F (37 C)  Wt 210 lb (95.255 kg)  SpO2 98%   Subjective:    Patient ID: [name] [name], female    DOB: 02-02-1950, 64 y.o.   MRN: [id number]  HPI: [name] [name] is a 64 y.o. female  Chief Complaint  Patient presents with  . Memory Loss    patient states that she is having trouble remembering since the heat stroke  . Rash    patient states that she has a itchy rash on her chest and stomach   She has been steadily improving from her episode of heat stroke in June. She has been limiting her time outside in the heat and staying hydrated. She denies feeling fatigued or having headaches. She expresses some stress over her husbands upcoming retirement. She also has concerns over forgetfulness because her mother had Alzheimers. She can recall long term memories as well as recent activities and current date. She occasionally forgets names or events. A rash has recently appeared on her chest and abdomen with no changes in exposure to lotions, detergents or environment that she is aware of.  Relevant past medical, surgical, family and social history reviewed and updated as indicated. Interim medical history since our last visit reviewed. Allergies and medications reviewed and updated.  Review of Systems  Constitutional: Negative.  Negative for activity change, appetite change and fatigue.  HENT: Negative.  Negative for congestion, postnasal drip, rhinorrhea, sinus pressure, sore throat and tinnitus.   Eyes: Negative.  Negative for discharge, redness, itching and visual disturbance.  Respiratory: Negative.  Negative for cough, chest tightness, shortness of breath, wheezing and stridor.   Cardiovascular: Negative.  Negative for chest pain, palpitations and leg swelling.  Gastrointestinal: Negative.  Negative for nausea, abdominal pain, diarrhea and constipation.  Genitourinary: Negative.  Negative for dysuria and frequency.  Musculoskeletal: Negative.   Negative for myalgias, back pain, joint swelling, arthralgias and gait problem.  Skin: Negative.  Negative for color change, pallor, rash and wound.  Neurological: Negative.  Negative for dizziness, weakness, light-headedness, numbness and headaches.  Psychiatric/Behavioral: Negative.  Negative for hallucinations, confusion, sleep disturbance, decreased concentration and agitation. The patient is not nervous/anxious.     Per HPI unless specifically indicated above     Objective:    BP 150/82 mmHg  Pulse 76  Temp(Src) 98.6 F (37 C)  Wt 210 lb (95.255 kg)  SpO2 98%  Wt Readings from Last 3 Encounters:  10/18/14 210 lb (95.255 kg)  07/29/14 209 lb 3.2 oz (94.892 kg)  07/23/14 208 lb (94.348 kg)    Physical Exam  Constitutional: She is oriented to person, place, and time. She appears well-developed and well-nourished. No distress.  HENT:  Head: Normocephalic and atraumatic.  Neck: Normal range of motion.  Cardiovascular: Normal rate, regular rhythm and normal heart sounds.  Exam reveals no gallop and no friction rub.   No murmur heard. Pulmonary/Chest: Effort normal and breath sounds normal. No respiratory distress. She has no wheezes. She has no rales. She exhibits no tenderness.  Neurological: She is alert and oriented to person, place, and time.  Skin: Skin is warm and dry. No rash noted. She is not diaphoretic. No erythema.  Psychiatric: She has a normal mood and affect. Her behavior is normal. Judgment and thought content normal.    Results for orders placed or performed during the hospital encounter of 07/23/14  CBC  Result Value Ref Range   WBC 8.0 3.6 -  11.0 K/uL   RBC 5.07 3.80 - 5.20 MIL/uL   Hemoglobin 14.2 12.0 - 16.0 g/dL   HCT 43.1 54.0 - 08.6 %   MCV 85.6 80.0 - 100.0 fL   MCH 28.1 26.0 - 34.0 pg   MCHC 32.8 32.0 - 36.0 g/dL   RDW 76.1 95.0 - 93.2 %   Platelets 213 150 - 440 K/uL  Troponin I  Result Value Ref Range   Troponin I <0.03 <0.031 ng/mL  Basic  metabolic panel  Result Value Ref Range   Sodium 137 135 - 145 mmol/L   Potassium 3.5 3.5 - 5.1 mmol/L   Chloride 102 101 - 111 mmol/L   CO2 25 22 - 32 mmol/L   Glucose, Bld 94 65 - 99 mg/dL   BUN 22 (H) 6 - 20 mg/dL   Creatinine, Ser 6.71 0.44 - 1.00 mg/dL   Calcium 9.1 8.9 - 24.5 mg/dL   GFR calc non Af Amer >60 >60 mL/min   GFR calc Af Amer >60 >60 mL/min   Anion gap 10 5 - 15      Assessment & Plan:   Problem List Items Addressed This Visit    None    Visit Diagnoses    Memory problem    -  Primary    No concern for memory issues. Patient is dealing with stress from multiple issues and will likely resolve as stress level decreases.    Immunization due            Follow up plan: Return 1-2 months, for Physical.

## 2015-01-03 ENCOUNTER — Other Ambulatory Visit: Payer: Self-pay | Admitting: Family Medicine

## 2015-01-03 DIAGNOSIS — I10 Essential (primary) hypertension: Secondary | ICD-10-CM

## 2015-01-03 DIAGNOSIS — Z0000 Encounter for general adult medical examination without abnormal findings: Secondary | ICD-10-CM

## 2015-01-03 DIAGNOSIS — M810 Age-related osteoporosis without current pathological fracture: Secondary | ICD-10-CM

## 2015-01-03 DIAGNOSIS — E785 Hyperlipidemia, unspecified: Secondary | ICD-10-CM

## 2015-01-06 ENCOUNTER — Encounter: Payer: Self-pay | Admitting: Family Medicine

## 2015-01-06 ENCOUNTER — Ambulatory Visit (INDEPENDENT_AMBULATORY_CARE_PROVIDER_SITE_OTHER): Payer: Medicare Other | Admitting: Family Medicine

## 2015-01-06 VITALS — BP 130/76 | HR 83 | Temp 97.8°F | Ht 62.4 in | Wt 206.0 lb

## 2015-01-06 DIAGNOSIS — E785 Hyperlipidemia, unspecified: Secondary | ICD-10-CM

## 2015-01-06 DIAGNOSIS — R631 Polydipsia: Secondary | ICD-10-CM | POA: Diagnosis not present

## 2015-01-06 DIAGNOSIS — Z23 Encounter for immunization: Secondary | ICD-10-CM | POA: Diagnosis not present

## 2015-01-06 DIAGNOSIS — M810 Age-related osteoporosis without current pathological fracture: Secondary | ICD-10-CM

## 2015-01-06 DIAGNOSIS — Z1211 Encounter for screening for malignant neoplasm of colon: Secondary | ICD-10-CM | POA: Diagnosis not present

## 2015-01-06 DIAGNOSIS — F32A Depression, unspecified: Secondary | ICD-10-CM

## 2015-01-06 DIAGNOSIS — H9192 Unspecified hearing loss, left ear: Secondary | ICD-10-CM

## 2015-01-06 DIAGNOSIS — I10 Essential (primary) hypertension: Secondary | ICD-10-CM | POA: Diagnosis not present

## 2015-01-06 DIAGNOSIS — Z1239 Encounter for other screening for malignant neoplasm of breast: Secondary | ICD-10-CM

## 2015-01-06 DIAGNOSIS — N390 Urinary tract infection, site not specified: Secondary | ICD-10-CM | POA: Diagnosis not present

## 2015-01-06 DIAGNOSIS — Z136 Encounter for screening for cardiovascular disorders: Secondary | ICD-10-CM

## 2015-01-06 DIAGNOSIS — Z0000 Encounter for general adult medical examination without abnormal findings: Secondary | ICD-10-CM

## 2015-01-06 DIAGNOSIS — Z113 Encounter for screening for infections with a predominantly sexual mode of transmission: Secondary | ICD-10-CM

## 2015-01-06 DIAGNOSIS — R3589 Other polyuria: Secondary | ICD-10-CM

## 2015-01-06 DIAGNOSIS — R358 Other polyuria: Secondary | ICD-10-CM | POA: Diagnosis not present

## 2015-01-06 DIAGNOSIS — F329 Major depressive disorder, single episode, unspecified: Secondary | ICD-10-CM

## 2015-01-06 LAB — MICROSCOPIC EXAMINATION

## 2015-01-06 LAB — MICROALBUMIN, URINE WAIVED
CREATININE, URINE WAIVED: 100 mg/dL (ref 10–300)
MICROALB, UR WAIVED: 30 mg/L — AB (ref 0–19)

## 2015-01-06 NOTE — Assessment & Plan Note (Signed)
Stable. Checking labs. Await results. Continue current regimen.

## 2015-01-06 NOTE — Progress Notes (Signed)
BP 130/76 mmHg  Pulse 83  Temp(Src) 97.8 F (36.6 C)  Ht 5' 2.4" (1.585 m)  Wt 206 lb (93.441 kg)  BMI 37.19 kg/m2  SpO2 96%   Subjective:    Patient ID: [name] [name], female    DOB: 1950-07-29, 64 y.o.   MRN: [id number]  HPI: [name] [name] is a 64 y.o. female presenting on 01/06/2015 for a welcome to medicare comprehensive exam. Current medical complaints include: having some issues with urine and memory, thinks that she gets down because of not remembering and feeling tired and down  She currently lives with: husband Menopausal Symptoms: hot flashes Advanced Directives: does not have one  Not seeing any other providers  Depression Screen done today and results listed below:  Depression screen [facility] 2/9 01/06/2015 01/06/2015  Decreased Interest 0 0  Down, Depressed, Hopeless 0 0  PHQ - 2 Score 0 0  Altered sleeping 0 -  Tired, decreased energy 1 -  Change in appetite 0 -  Feeling bad or failure about yourself  1 -  Trouble concentrating 0 -  Moving slowly or fidgety/restless 1 -  Suicidal thoughts 0 -  PHQ-9 Score 3 -   The patient has a history of falls. I did complete a risk assessment for falls. A plan of care for falls was documented.  Past Medical History:  Past Medical History  Diagnosis Date  . Hypertension partial   Surgical History:  Past Surgical History  Procedure Laterality Date  . Abdominal hysterectomy    . Cholecystectomy    . Carpal tunnel release    . Hammer toe surgery Bilateral   . Cesarean section     Medications:  Current Outpatient Prescriptions on File Prior to Visit  Medication Sig  . buPROPion (WELLBUTRIN XL) 300 MG 24 hr tablet Take 300 mg by mouth daily.  . hydrochlorothiazide (HYDRODIURIL) 25 MG tablet Take 25 mg by mouth daily.  Marland Kitchen venlafaxine XR (EFFEXOR-XR) 37.5 MG 24 hr capsule Take 37.5 mg by mouth at bedtime.    Current Facility-Administered Medications on File Prior to Visit  Medication  . aspirin tablet 325 mg   Allergies:   Allergies  Allergen Reactions  . Codeine Nausea And Vomiting  . Tussionex Pennkinetic Er [Hydrocod Polst-Cpm Polst Er] Nausea And Vomiting   Social History:  Social History   Social History  . Marital Status: Married    Spouse Name: N/A  . Number of Children: N/A  . Years of Education: N/A   Occupational History  . Not on file.   Social History Main Topics  . Smoking status: Never Smoker   . Smokeless tobacco: Never Used  . Alcohol Use: No  . Drug Use: No  . Sexual Activity: Not Currently   Other Topics Concern  . Not on file   Social History Narrative   History  Smoking status  . Never Smoker   Smokeless tobacco  . Never Used   History  Alcohol Use No    Family History:  Family History  Problem Relation Age of Onset  . Heart disease Mother   . Heart attack Father   . Hypertension Father   . Cancer Paternal Aunt     Past medical history, surgical history, medications, allergies, family history and social history reviewed with patient today and changes made to appropriate areas of the chart.   Review of Systems  Constitutional: Positive for diaphoresis. Negative for fever, chills, weight loss and malaise/fatigue.  HENT: Negative.  Eyes: Negative.   Respiratory: Negative.   Cardiovascular: Negative.   Gastrointestinal: Negative.   Genitourinary: Positive for urgency and frequency. Negative for dysuria, hematuria and flank pain.       Incontience  Musculoskeletal: Negative.   Skin: Positive for itching. Negative for rash.  Neurological: Negative.  Negative for weakness.  Endo/Heme/Allergies: Positive for polydipsia. Negative for environmental allergies. Does not bruise/bleed easily.  Psychiatric/Behavioral: Positive for memory loss. Negative for depression, suicidal ideas, hallucinations and substance abuse. The patient is not nervous/anxious and does not have insomnia.     All other ROS negative except what is listed above and in the HPI.       Objective:    BP 130/76 mmHg  Pulse 83  Temp(Src) 97.8 F (36.6 C)  Ht 5' 2.4" (1.585 m)  Wt 206 lb (93.441 kg)  BMI 37.19 kg/m2  SpO2 96%  Wt Readings from Last 3 Encounters:  01/06/15 206 lb (93.441 kg)  10/18/14 210 lb (95.255 kg)  07/29/14 209 lb 3.2 oz (94.892 kg)    Physical Exam  Constitutional: She is oriented to person, place, and time. She appears well-developed and well-nourished. No distress.  HENT:  Head: Normocephalic and atraumatic.  Right Ear: Hearing, tympanic membrane, external ear and ear canal normal.  Left Ear: Hearing, tympanic membrane, external ear and ear canal normal.  Nose: Nose normal.  Mouth/Throat: Uvula is midline, oropharynx is clear and moist and mucous membranes are normal. No oropharyngeal exudate.  Eyes: Conjunctivae, EOM and lids are normal. Pupils are equal, round, and reactive to light. Right eye exhibits no discharge. Left eye exhibits no discharge. No scleral icterus.  Neck: Normal range of motion. Neck supple. No JVD present. No tracheal deviation present. No thyromegaly present.  Cardiovascular: Normal rate, regular rhythm, normal heart sounds and intact distal pulses.  Exam reveals no gallop and no friction rub.   No murmur heard. Pulmonary/Chest: Effort normal and breath sounds normal. No stridor. No respiratory distress. She has no decreased breath sounds. She has no wheezes. She has no rhonchi. She has no rales. She exhibits no tenderness. Right breast exhibits no inverted nipple, no mass, no nipple discharge, no skin change and no tenderness. Left breast exhibits no inverted nipple, no mass, no nipple discharge, no skin change and no tenderness. Breasts are symmetrical.  Abdominal: Soft. Bowel sounds are normal. She exhibits no distension and no mass. There is tenderness (over stretch mark on RLQ- not deep pain). There is no rebound and no guarding.  Genitourinary:  Deferred with shared decision making  Musculoskeletal: Normal range of  motion. She exhibits no edema or tenderness.  Lymphadenopathy:    She has no cervical adenopathy.  Neurological: She is alert and oriented to person, place, and time. She has normal reflexes. She displays normal reflexes. No cranial nerve deficit. She exhibits normal muscle tone. Coordination normal.  Skin: Skin is warm, dry and intact. No rash noted. She is not diaphoretic. No erythema. No pallor.  Psychiatric: She has a normal mood and affect. Her speech is normal and behavior is normal. Judgment and thought content normal. Cognition and memory are normal.  Nursing note and vitals reviewed.  Results for orders placed or performed in visit on 01/06/15  HM MAMMOGRAPHY  Result Value Ref Range   HM Mammogram Normal   HM DEXA SCAN  Result Value Ref Range   HM Dexa Scan Osteoporosis   HM COLONOSCOPY  Result Value Ref Range   HM Colonoscopy normal  Assessment & Plan:   Problem List Items Addressed This Visit      Cardiovascular and Mediastinum   Hypertension    Stable. Checking labs. Await results. Continue current regimen.         Musculoskeletal and Integument   Osteoporosis   Relevant Orders   DG Bone Density     Other   Depression    Stable at this time. Continue current regimen. Continue to monitor.       Hyperlipidemia    Checking labs today. Await results. Continue to monitor.       Hearing loss in left ear    On our exam. Will refer to ENT/Audiology for full evaluation and discussion of options.       Relevant Orders   Ambulatory referral to ENT    Other Visit Diagnoses    Welcome to Medicare preventive visit    -  Primary    Screening labs checked. Preventative care reviewed. Does not see other providers. See below.     Relevant Orders    EKG 12-Lead (Completed)    Screening for breast cancer        Mammogram ordered today.     Relevant Orders    MM DIGITAL SCREENING BILATERAL    Routine screening for STI (sexually transmitted infection)         Hep C and HIV ordered today.     Relevant Orders    HIV antibody    Hepatitis C antibody    Routine general medical examination at a health care facility        Encounter for special screening examination for cardiovascular disorder        EKG done today. No changes from prior. No concerns.     Relevant Orders    EKG 12-Lead (Completed)    Immunization due        Prevnar 13 given today. Will get pneumovax next visit. Will return if has a wound for td. Otherwise up to date.    Relevant Orders    Pneumococcal conjugate vaccine 13-valent (Completed)    Screening for colon cancer        Will do cologuard. Ordered today.     Relevant Orders    Cologuard    Polydipsia        Family history of DM- checking labs today.    Relevant Orders    Hgb A1c w/o eAG    Polyuria        Family history of DM. Checking labs today.    Relevant Orders    Hgb A1c w/o eAG        Follow up plan: Return ASAP, for follow up bladder and memory.  AAA screening- Not indicated DEXA- last done in 2012. Will reorder today given osteopenia Breast cancer screening- mammogram ordered today CVD screening- lipids ordered today Cervical Cancer Screening- s/p hysterectomy for benign reasons. Not applicable Colon Cancer Screening- Colonoscopy 2003 Depression Screening- done today Diabetes screening- checking today Hep B vaccine- not indicated Hep C testing- done today HIV screen- done today Flu shot- up to date Lung Cancer Screening- Not indicated Pneumonia vaccine- prevnar given today, pneumovax next year STI screening- low risk, not indicated Advanced Directive- information and 5 wishes given to patient today.  LABORATORY TESTING:  - Pap smear: not applicable  IMMUNIZATIONS:   - Tdap: Tetanus vaccination status reviewed: Indicated, will return with scratch. - Influenza: Up to date - Pneumovax: to get next year - Prevnar: Administered today -  Zostavax vaccine: Up to date  SCREENING: -Mammogram:  Ordered today  - Colonoscopy: Refused- will do cologuard, ordered today  - Bone Density: Up to date  -Hearing Test: Ordered today  -Spirometry: Not applicable   PATIENT COUNSELING:   Advised to take 1 mg of folate supplement per day if capable of pregnancy.   Sexuality: Discussed sexually transmitted diseases, partner selection, use of condoms, avoidance of unintended pregnancy  and contraceptive alternatives.   Advised to avoid cigarette smoking.  I discussed with the patient that most people either abstain from alcohol or drink within safe limits (<=14/week and <=4 drinks/occasion for males, <=7/weeks and <= 3 drinks/occasion for females) and that the risk for alcohol disorders and other health effects rises proportionally with the number of drinks per week and how often a drinker exceeds daily limits.  Discussed cessation/primary prevention of drug use and availability of treatment for abuse.   Diet: Encouraged to adjust caloric intake to maintain  or achieve ideal body weight, to reduce intake of dietary saturated fat and total fat, to limit sodium intake by avoiding high sodium foods and not adding table salt, and to maintain adequate dietary potassium and calcium preferably from fresh fruits, vegetables, and low-fat dairy products.    stressed the importance of regular exercise  Injury prevention: Discussed safety belts, safety helmets, smoke detector, smoking near bedding or upholstery.   Dental health: Discussed importance of regular tooth brushing, flossing, and dental visits.    NEXT PREVENTATIVE PHYSICAL DUE IN 1 YEAR. Return ASAP, for follow up bladder and memory.

## 2015-01-06 NOTE — Assessment & Plan Note (Signed)
Checking labs today. Await results. Continue to monitor.  

## 2015-01-06 NOTE — Assessment & Plan Note (Signed)
Stable at this time. Continue current regimen. Continue to monitor.  

## 2015-01-06 NOTE — Patient Instructions (Addendum)
Welcome to medicare visit  Screenings for you: AAA screening- Not indicated DEXA- last done in 2012. Will reorder today given osteopenia Breast cancer screening- mammogram ordered today CVD screening- lipids ordered today Cervical Cancer Screening- s/p hysterectomy for benign reasons. Not applicable Colon Cancer Screening- Colonoscopy 2003, will do cologuard- they will contact you shortly Depression Screening- done today Diabetes screening- checking today Hep B vaccine- not indicated Hep C testing- done today HIV screen- done today Flu shot- up to date Lung Cancer Screening- Not indicated Pneumonia vaccine- prevnar given today, pneumovax next year STI screening- low risk, not indicated  List of all your doctors: Just us!  Advance Directive Advance directives are the legal documents that allow you to make choices about your health care and medical treatment if you cannot speak for yourself. Advance directives are a way for you to communicate your wishes to family, friends, and health care providers. The specified people can then convey your decisions about end-of-life care to avoid confusion if you should become unable to communicate. Ideally, the process of discussing and writing advance directives should happen over time rather than making decisions all at once. Advance directives can be modified as your situation changes, and you can change your mind at any time, even after you have signed the advance directives. Each state has its own laws regarding advance directives. You may want to check with your health care provider, attorney, or state representative about the law in your state. Below are some examples of advance directives. LIVING WILL A living will is a set of instructions documenting your wishes about medical care when you cannot care for yourself. It is used if you become:  Terminally ill.  Incapacitated.  Unable to communicate.  Unable to make decisions. Items to  consider in your living will include:  The use or non-use of life-sustaining equipment, such as dialysis machines and breathing machines (ventilators).  A do not resuscitate (DNR) order, which is the instruction not to use cardiopulmonary resuscitation (CPR) if breathing or heartbeat stops.  Tube feeding.  Withholding of food and fluids.  Comfort (palliative) care when the goal becomes comfort rather than a cure.  Organ and tissue donation. A living will does not give instructions about distribution of your money and property if you should pass away. It is advisable to seek the expert advice of a lawyer in drawing up a will regarding your possessions. Decisions about taxes, beneficiaries, and asset distribution will be legally binding. This process can relieve your family and friends of any burdens surrounding disputes or questions that may come up about the allocation of your assets. DO NOT RESUSCITATE (DNR) A do not resuscitate (DNR) order is a request to not have CPR in the event that your heart stops beating or you stop breathing. Unless given other instructions, a health care provider will try to help any patient whose heart has stopped or who has stopped breathing.  HEALTH CARE PROXY AND DURABLE POWER OF ATTORNEY FOR HEALTH CARE A health care proxy is a person (agent) appointed to make medical decisions for you if you cannot. Generally, people choose someone they know well and trust to represent their preferences when they can no longer do so. You should be sure to ask this person for agreement to act as your agent. An agent may have to exercise judgment in the event of a medical decision for which your wishes are not known. The durable power of attorney for health care is the legal document that  names your health care proxy. Once written, it should be:  Signed.  Notarized.  Dated.  Copied.  Witnessed.  Incorporated into your medical record. You may also want to appoint someone  to manage your financial affairs if you cannot. This is called a durable power of attorney for finances. It is a separate legal document from the durable power of attorney for health care. You may choose the same person or someone different from your health care proxy to act as your agent in financial matters.   This information is not intended to replace advice given to you by your health care provider. Make sure you discuss any questions you have with your health care provider.   Document Released: 04/27/2007 Document Revised: 01/23/2013 Document Reviewed: 06/07/2012 Elsevier Interactive Patient Education [employer]

## 2015-01-06 NOTE — Assessment & Plan Note (Signed)
On our exam. Will refer to ENT/Audiology for full evaluation and discussion of options.

## 2015-01-07 LAB — COMPREHENSIVE METABOLIC PANEL
ALK PHOS: 85 IU/L (ref 39–117)
ALT: 30 IU/L (ref 0–32)
AST: 27 IU/L (ref 0–40)
Albumin/Globulin Ratio: 2 (ref 1.1–2.5)
Albumin: 4.3 g/dL (ref 3.6–4.8)
BUN/Creatinine Ratio: 16 (ref 11–26)
BUN: 14 mg/dL (ref 8–27)
Bilirubin Total: 0.4 mg/dL (ref 0.0–1.2)
CO2: 26 mmol/L (ref 18–29)
CREATININE: 0.85 mg/dL (ref 0.57–1.00)
Calcium: 9.2 mg/dL (ref 8.7–10.3)
Chloride: 98 mmol/L (ref 97–106)
GFR calc Af Amer: 83 mL/min/{1.73_m2} (ref >59)
GFR calc non Af Amer: 72 mL/min/{1.73_m2} (ref >59)
GLUCOSE: 102 mg/dL — AB (ref 65–99)
Globulin, Total: 2.2 g/dL (ref 1.5–4.5)
Potassium: 3.9 mmol/L (ref 3.5–5.2)
Sodium: 141 mmol/L (ref 136–144)
Total Protein: 6.5 g/dL (ref 6.0–8.5)

## 2015-01-07 LAB — CBC WITH DIFFERENTIAL/PLATELET
Basophils Absolute: 0 10*3/uL (ref 0.0–0.2)
Basos: 1 %
EOS (ABSOLUTE): 0.1 10*3/uL (ref 0.0–0.4)
EOS: 2 %
HEMATOCRIT: 41.5 % (ref 34.0–46.6)
Hemoglobin: 13.9 g/dL (ref 11.1–15.9)
IMMATURE GRANULOCYTES: 0 %
Immature Grans (Abs): 0 10*3/uL (ref 0.0–0.1)
Lymphocytes Absolute: 1.4 10*3/uL (ref 0.7–3.1)
Lymphs: 25 %
MCH: 28.3 pg (ref 26.6–33.0)
MCHC: 33.5 g/dL (ref 31.5–35.7)
MCV: 85 fL (ref 79–97)
MONOCYTES: 8 %
MONOS ABS: 0.4 10*3/uL (ref 0.1–0.9)
NEUTROS PCT: 64 %
Neutrophils Absolute: 3.6 10*3/uL (ref 1.4–7.0)
Platelets: 229 10*3/uL (ref 150–379)
RBC: 4.91 x10E6/uL (ref 3.77–5.28)
RDW: 13.7 % (ref 12.3–15.4)
WBC: 5.5 10*3/uL (ref 3.4–10.8)

## 2015-01-07 LAB — TSH: TSH: 0.662 u[IU]/mL (ref 0.450–4.500)

## 2015-01-07 LAB — HGB A1C W/O EAG: HEMOGLOBIN A1C: 5.7 % — AB (ref 4.8–5.6)

## 2015-01-07 LAB — LIPID PANEL W/O CHOL/HDL RATIO
CHOLESTEROL TOTAL: 196 mg/dL (ref 100–199)
HDL: 38 mg/dL — AB (ref >39)
LDL Calculated: 107 mg/dL — ABNORMAL HIGH (ref 0–99)
TRIGLYCERIDES: 256 mg/dL — AB (ref 0–149)
VLDL Cholesterol Cal: 51 mg/dL — ABNORMAL HIGH (ref 5–40)

## 2015-01-07 LAB — VITAMIN D 25 HYDROXY (VIT D DEFICIENCY, FRACTURES): Vit D, 25-Hydroxy: 15.8 ng/mL — ABNORMAL LOW (ref 30.0–100.0)

## 2015-01-07 LAB — HEPATITIS C ANTIBODY

## 2015-01-07 LAB — HIV ANTIBODY (ROUTINE TESTING W REFLEX): HIV SCREEN 4TH GENERATION: NONREACTIVE

## 2015-01-08 ENCOUNTER — Telehealth: Payer: Self-pay

## 2015-01-08 ENCOUNTER — Encounter: Payer: Self-pay | Admitting: Family Medicine

## 2015-01-08 ENCOUNTER — Telehealth: Payer: Self-pay | Admitting: Family Medicine

## 2015-01-08 DIAGNOSIS — R7301 Impaired fasting glucose: Secondary | ICD-10-CM | POA: Insufficient documentation

## 2015-01-08 DIAGNOSIS — E559 Vitamin D deficiency, unspecified: Secondary | ICD-10-CM

## 2015-01-08 MED ORDER — VITAMIN D (ERGOCALCIFEROL) 1.25 MG (50000 UNIT) PO CAPS: 50000.0000 [IU] | ORAL_CAPSULE | ORAL | Status: DC

## 2015-01-08 MED ORDER — NITROFURANTOIN MONOHYD MACRO 100 MG PO CAPS: 100.0000 mg | ORAL_CAPSULE | Freq: Two times a day (BID) | ORAL | Status: DC

## 2015-01-08 NOTE — Telephone Encounter (Signed)
Called with the results of her blood work. E. Coli in urine. Having urgency, and foul smell and frequency. Will treat. She will let us know if not getting better. Vitamin D deficiency. Will replace. IFG- will work on diet and exercise.

## 2015-01-09 LAB — URINE CULTURE, REFLEX

## 2015-01-09 NOTE — Telephone Encounter (Signed)
Sent to cma for completion 

## 2015-01-10 LAB — UA/M W/RFLX CULTURE, ROUTINE

## 2015-01-13 ENCOUNTER — Encounter: Payer: Self-pay | Admitting: Family Medicine

## 2015-01-13 ENCOUNTER — Ambulatory Visit (INDEPENDENT_AMBULATORY_CARE_PROVIDER_SITE_OTHER): Payer: Medicare Other | Admitting: Family Medicine

## 2015-01-13 VITALS — BP 133/84 | HR 82 | Temp 97.9°F | Wt 206.0 lb

## 2015-01-13 DIAGNOSIS — N952 Postmenopausal atrophic vaginitis: Secondary | ICD-10-CM

## 2015-01-13 DIAGNOSIS — R413 Other amnesia: Secondary | ICD-10-CM

## 2015-01-13 MED ORDER — VENLAFAXINE HCL ER 75 MG PO CP24: 75.0000 mg | ORAL_CAPSULE | Freq: Every day | ORAL | Status: DC

## 2015-01-13 NOTE — Progress Notes (Signed)
BP 133/84 mmHg  Pulse 82  Temp(Src) 97.9 F (36.6 C)  Wt 206 lb (93.441 kg)  SpO2 94%   Subjective:    Patient ID: [name] [name], female    DOB: February 25, 1950, 64 y.o.   MRN: [id number]  HPI: [name] [name] is a 64 y.o. female  Chief Complaint  Patient presents with  . overactive bladder  . Memory Loss   URINARY SYMPTOMS Duration: 2 weeks, has been doing better with the antibiotic Dysuria: no Urinary frequency: yes Urgency: yes Small volume voids: yes Symptom severity: moderate Urinary incontinence: yes Foul odor: yes Hematuria: no Abdominal pain: no Back pain: no Suprapubic pain/pressure: no Flank pain: yes Fever:  no Vomiting: no Relief with cranberry juice: no Relief with pyridium: no Status: better- not 100% Previous urinary tract infection: yes Recurrent urinary tract infection: no Sexual activity: No sexually active  History of sexually transmitted disease: no Vaginal discharge: no Treatments attempted: antibiotics  No family history of breast or ovarian cancer Has a long history of vaginal driness  Feels like she can't remember things- feels like it's getting better. Concerned that she might be developing Alzheimer's as her mother had it. Feels like she can't remember people's names, but then remembers them later. Does not forget people close to her. Does not forget recent or past events. Feels like she is doing better with it. Full work up done this summer with heat stroke was negative. No other concerns or complaints at this time.   Relevant past medical, surgical, family and social history reviewed and updated as indicated. Interim medical history since our last visit reviewed. Allergies and medications reviewed and updated.  Review of Systems  Constitutional: Negative.   Respiratory: Negative.   Cardiovascular: Negative.   Genitourinary: Positive for urgency, decreased urine volume and difficulty urinating. Negative for dysuria, frequency, hematuria,  flank pain, vaginal bleeding, vaginal discharge, enuresis, genital sores, vaginal pain, menstrual problem, pelvic pain and dyspareunia.  Psychiatric/Behavioral: Negative.     Per HPI unless specifically indicated above     Objective:    BP 133/84 mmHg  Pulse 82  Temp(Src) 97.9 F (36.6 C)  Wt 206 lb (93.441 kg)  SpO2 94%  Wt Readings from Last 3 Encounters:  01/13/15 206 lb (93.441 kg)  01/06/15 206 lb (93.441 kg)  10/18/14 210 lb (95.255 kg)    Physical Exam  Constitutional: She is oriented to person, place, and time. She appears well-developed and well-nourished. No distress.  HENT:  Head: Normocephalic and atraumatic.  Right Ear: Hearing normal.  Left Ear: Hearing normal.  Nose: Nose normal.  Eyes: Conjunctivae and lids are normal. Right eye exhibits no discharge. Left eye exhibits no discharge. No scleral icterus.  Cardiovascular: Normal rate, regular rhythm, normal heart sounds and intact distal pulses.  Exam reveals no gallop and no friction rub.   No murmur heard. Pulmonary/Chest: Effort normal and breath sounds normal. No respiratory distress. She has no wheezes. She has no rales. She exhibits no tenderness.  Musculoskeletal: Normal range of motion.  Neurological: She is alert and oriented to person, place, and time.  Skin: Skin is warm, dry and intact. No rash noted. No erythema. No pallor.  Psychiatric: She has a normal mood and affect. Her speech is normal and behavior is normal. Judgment and thought content normal. Cognition and memory are normal.  Nursing note and vitals reviewed.   Results for orders placed or performed in visit on 01/06/15  Microscopic Examination  Result Value Ref Range  WBC, UA 6-10 (A) 0 -  5 /hpf   RBC, UA 0-2 0 -  2 /hpf   Epithelial Cells (non renal) 0-10 0 - 10 /hpf   Mucus, UA Present Not Estab.   Bacteria, UA Few None seen/Few  HM MAMMOGRAPHY  Result Value Ref Range   HM Mammogram Normal   HM DEXA SCAN  Result Value Ref Range    HM Dexa Scan Osteoporosis   HIV antibody  Result Value Ref Range   HIV Screen 4th Generation wRfx Non Reactive Non Reactive  Hepatitis C antibody  Result Value Ref Range   Hep C Virus Ab <0.1 0.0 - 0.9 s/co ratio  Comprehensive metabolic panel  Result Value Ref Range   Glucose 102 (H) 65 - 99 mg/dL   BUN 14 8 - 27 mg/dL   Creatinine, Ser 1.61 0.57 - 1.00 mg/dL   GFR calc non Af Amer 72 >59 mL/min/1.73   GFR calc Af Amer 83 >59 mL/min/1.73   BUN/Creatinine Ratio 16 11 - 26   Sodium 141 136 - 144 mmol/L   Potassium 3.9 3.5 - 5.2 mmol/L   Chloride 98 97 - 106 mmol/L   CO2 26 18 - 29 mmol/L   Calcium 9.2 8.7 - 10.3 mg/dL   Total Protein 6.5 6.0 - 8.5 g/dL   Albumin 4.3 3.6 - 4.8 g/dL   Globulin, Total 2.2 1.5 - 4.5 g/dL   Albumin/Globulin Ratio 2.0 1.1 - 2.5   Bilirubin Total 0.4 0.0 - 1.2 mg/dL   Alkaline Phosphatase 85 39 - 117 IU/L   AST 27 0 - 40 IU/L   ALT 30 0 - 32 IU/L  CBC with Differential/Platelet  Result Value Ref Range   WBC 5.5 3.4 - 10.8 x10E3/uL   RBC 4.91 3.77 - 5.28 x10E6/uL   Hemoglobin 13.9 11.1 - 15.9 g/dL   Hematocrit 09.6 04.5 - 46.6 %   MCV 85 79 - 97 fL   MCH 28.3 26.6 - 33.0 pg   MCHC 33.5 31.5 - 35.7 g/dL   RDW 40.9 81.1 - 91.4 %   Platelets 229 150 - 379 x10E3/uL   Neutrophils 64 %   Lymphs 25 %   Monocytes 8 %   Eos 2 %   Basos 1 %   Neutrophils Absolute 3.6 1.4 - 7.0 x10E3/uL   Lymphocytes Absolute 1.4 0.7 - 3.1 x10E3/uL   Monocytes Absolute 0.4 0.1 - 0.9 x10E3/uL   EOS (ABSOLUTE) 0.1 0.0 - 0.4 x10E3/uL   Basophils Absolute 0.0 0.0 - 0.2 x10E3/uL   Immature Granulocytes 0 %   Immature Grans (Abs) 0.0 0.0 - 0.1 x10E3/uL  Microalbumin, Urine Waived  Result Value Ref Range   Microalb, Ur Waived 30 (H) 0 - 19 mg/L   Creatinine, Urine Waived 100 10 - 300 mg/dL   Microalb/Creat Ratio <30 <30 mg/g  Lipid Panel w/o Chol/HDL Ratio  Result Value Ref Range   Cholesterol, Total 196 100 - 199 mg/dL   Triglycerides 782 (H) 0 - 149 mg/dL   HDL  38 (L) >95 mg/dL   VLDL Cholesterol Cal 51 (H) 5 - 40 mg/dL   LDL Calculated 621 (H) 0 - 99 mg/dL  TSH  Result Value Ref Range   TSH 0.662 0.450 - 4.500 uIU/mL  UA/M w/rflx Culture, Routine  Result Value Ref Range   Urine Culture, Routine Final report (A)    Urine Culture result 1 Escherichia coli (A)    ANTIMICROBIAL SUSCEPTIBILITY Comment   VITAMIN D  25 Hydroxy (Vit-D Deficiency, Fractures)  Result Value Ref Range   Vit D, 25-Hydroxy 15.8 (L) 30.0 - 100.0 ng/mL  Hgb A1c w/o eAG  Result Value Ref Range   Hgb A1c MFr Bld 5.7 (H) 4.8 - 5.6 %  Urine Culture, Routine  Result Value Ref Range   Urine Culture result 1 Escherichia coli (A)   HM COLONOSCOPY  Result Value Ref Range   HM Colonoscopy normal       Assessment & Plan:   Problem List Items Addressed This Visit      Genitourinary   Vaginal atrophy - Primary    Will try OTC lubricants. Information given. Follow up in 1 month, if not better, will consider low dose hormones. Finish abx. Call if not better.         Other   Memory loss    Patient is very concerned about developing Alzheimer's given her mother's history. Seems to be more stress and anxiety. Will increase her effexor to  daily and see if that helps. Will also get her into see neurology to discuss. Tried to reassure patient, but she is very anxious about this.        Relevant Orders   Ambulatory referral to Neurology       Follow up plan: Return in about 4 weeks (around 02/10/2015).

## 2015-01-13 NOTE — Patient Instructions (Signed)
What is atrophic vaginitis? - Atrophic vaginitis is a condition that causes the vagina and tissues near the vagina to get dry, thin, and inflamed. This can be uncomfortable or make sex painful. Atrophic vaginitis is sometimes called "vaginal atrophy." Atrophic vaginitis happens when a woman does not make enough of a hormone called estrogen. This condition mainly affects women who have been through menopause (meaning they have stopped having a monthly period). It can also happen to women whose ovaries were removed, who are taking certain medicines, or who are nursing. What are the symptoms of atrophic vaginitis? - The symptoms include: ?Vaginal dryness ?Vaginal burning or irritation ?Making less lubrication during sex ?Pain during sex ?Bleeding when something touches or rubs the vagina, for example after sex. (If you have this symptom, be sure to see a doctor.) ?Vaginal discharge (leaking fluid from the vagina) ?Urinary problems, such as having to urinate often, having pain with urination, or having blood in the urine. (If you have these symptoms, be sure to see a doctor.) Some women never tell their doctor they are having symptoms of atrophic vaginitis. Often they are embarrassed or think the symptoms are a normal part of aging. If you have symptoms of this condition, and they bother you, mention it to your doctor or nurse. There are treatments that can help. Is there a test for atrophic vaginitis? - No. There is no test. But your doctor or nurse should be able to tell if you have it by learning about your symptoms and doing an exam. Is there anything I can do on my own to feel better? - Yes. Some women feel better if they use lubricants before sex and use a vaginal moisturizer, such as Replens or Lubrin, several times a week. Vaginal moisturizers are not the same as lubricants. They help keep the vagina moist all the time, not just during sex. Should I see a doctor or nurse? - See your doctor or nurse  if you have symptoms of atrophic vaginitis and they bother you. How is atrophic vaginitis treated? - The most effective treatment for atrophic vaginitis is the hormone estrogen. When using estrogen to treat atrophic vaginitis, doctors recommend "vaginal estrogen." Vaginal estrogen is any form of estrogen that goes directly into the vagina. It comes in creams, tablets, or a flexible ring. Vaginal estrogen comes in small doses that don't increase the levels of estrogen in other parts of the body very much. Some women who take vaginal estrogen must also take another hormone, called progesterone. Estrogen also comes in higher doses in a pill that you swallow, a skin patch, or a different vaginal ring. These are sometimes called "hormone replacement therapy," and need to be taken with progesterone. But vaginal estrogen is better for treating symptoms of atrophic vaginitis. If you want to take estrogen, ask your doctor or nurse what the possible risks and benefits are for you. If you have had breast or uterine cancer, or if you are at risk for these cancers, ask whether hormones are safe for you. Besides estrogen, there are also 2 other medicines that can treat atrophic vaginitis: ?Ospemifene (brand name: Arna Snipe) is similar to estrogen, but is not estrogen. It comes as a pill you take once a day. It can cause hot flashes. ?Prasterone, also called dehydroepiandrosterone (DHEA), is a medicine that comes in a tablet that you put into your vagina once a day. More on this topic Patient education: Dyspareunia (painful sex) (The Basics) Patient education: Menopause (The Basics) Patient education:  Sex problems in women (The Basics) Patient education: Vaginal discharge in adults (The Basics) Patient education: Vulvar pain (The Basics) Patient education: Menopause (Beyond the Basics) Patient education: Vaginal dryness (Beyond the Basics) Patient education: Menopausal hormone therapy (Beyond the Basics) Patient  education: Sexual problems in women (Beyond the Basics) Patient education: Vaginal discharge in adult women (Beyond the Basics) All topics are updated as new evidence becomes available and our peer review process is complete.  This topic retrieved from UpToDate on: Jan 13, 2015.

## 2015-01-13 NOTE — Assessment & Plan Note (Signed)
Patient is very concerned about developing Alzheimer's given her mother's history. Seems to be more stress and anxiety. Will increase her effexor to 75mg  daily and see if that helps. Will also get her into see neurology to discuss. Tried to reassure patient, but she is very anxious about this.

## 2015-01-13 NOTE — Assessment & Plan Note (Signed)
Will try OTC lubricants. Information given. Follow up in 1 month, if not better, will consider low dose hormones. Finish abx. Call if not better.

## 2015-01-20 ENCOUNTER — Telehealth: Payer: Self-pay | Admitting: Family Medicine

## 2015-01-20 DIAGNOSIS — H903 Sensorineural hearing loss, bilateral: Secondary | ICD-10-CM | POA: Diagnosis not present

## 2015-01-20 MED ORDER — DOXYCYCLINE HYCLATE 100 MG PO TABS: 100.0000 mg | ORAL_TABLET | Freq: Two times a day (BID) | ORAL | Status: DC

## 2015-01-20 NOTE — Telephone Encounter (Signed)
Patient notified

## 2015-01-20 NOTE — Telephone Encounter (Signed)
Please let her know that I'll send in another antibiotic If symptoms are not significantly better in 24-48 hours, please make an appt here or go to urgent care Encourage hydration Ask her to take a probiotic or eat yogurt every day to help prevent an infection called C diff; if she gets diarrhea in the next two months, make appt to be seen right away

## 2015-01-20 NOTE — Telephone Encounter (Signed)
Pt would like to have something called in to [facility] because her kidney issue isn't any better.

## 2015-01-20 NOTE — Telephone Encounter (Signed)
Patient was on antibiotic for positive urine with e. Coli. Patient states that she feels like she needs to urinate all the time but when she goes to void it is little to nothing.

## 2015-02-06 DIAGNOSIS — R413 Other amnesia: Secondary | ICD-10-CM | POA: Diagnosis not present

## 2015-02-07 ENCOUNTER — Other Ambulatory Visit: Payer: Self-pay | Admitting: Neurology

## 2015-02-07 DIAGNOSIS — R413 Other amnesia: Secondary | ICD-10-CM

## 2015-02-13 ENCOUNTER — Ambulatory Visit (INDEPENDENT_AMBULATORY_CARE_PROVIDER_SITE_OTHER): Payer: Medicare Other | Admitting: Family Medicine

## 2015-02-13 ENCOUNTER — Encounter: Payer: Self-pay | Admitting: Family Medicine

## 2015-02-13 VITALS — BP 147/84 | HR 87 | Temp 97.9°F | Ht 62.2 in | Wt 202.0 lb

## 2015-02-13 DIAGNOSIS — F329 Major depressive disorder, single episode, unspecified: Secondary | ICD-10-CM

## 2015-02-13 DIAGNOSIS — E785 Hyperlipidemia, unspecified: Secondary | ICD-10-CM | POA: Diagnosis not present

## 2015-02-13 DIAGNOSIS — N952 Postmenopausal atrophic vaginitis: Secondary | ICD-10-CM | POA: Diagnosis not present

## 2015-02-13 DIAGNOSIS — F32A Depression, unspecified: Secondary | ICD-10-CM

## 2015-02-13 MED ORDER — HYDROCHLOROTHIAZIDE 25 MG PO TABS: 25.0000 mg | ORAL_TABLET | Freq: Every day | ORAL | Status: DC

## 2015-02-13 MED ORDER — VENLAFAXINE HCL ER 75 MG PO CP24: 75.0000 mg | ORAL_CAPSULE | Freq: Every day | ORAL | Status: DC

## 2015-02-13 MED ORDER — BUPROPION HCL ER (XL) 300 MG PO TB24: 300.0000 mg | ORAL_TABLET | Freq: Every day | ORAL | Status: DC

## 2015-02-13 NOTE — Assessment & Plan Note (Signed)
Elevated at neurologist, but not fasting. Will check again today and treat if needed.

## 2015-02-13 NOTE — Assessment & Plan Note (Signed)
Doing well on the lubricants. Not interested in hormones at this time. Continue to monitor. Call if not working any more or would like hormones.

## 2015-02-13 NOTE — Progress Notes (Signed)
BP 147/84 mmHg  Pulse 87  Temp(Src) 97.9 F (36.6 C)  Ht 5' 2.2" (1.58 m)  Wt 202 lb (91.627 kg)  BMI 36.70 kg/m2  SpO2 96%   Subjective:    Patient ID: [name] [name], female    DOB: 11-04-1950, 65 y.o.   MRN: [id number]  HPI: [name] [name] is a 65 y.o. female  Chief Complaint  Patient presents with  . Anxiety  . Vaginal Atrophy   ANXIETY/STRESS- feeling a little bit better. Still adjusting to having her husband home. Wants to stay where she is on the medicine, feels like it helps a lot. She notes that she is feeling really angry with him. Not sure if the memory has gotten much better. She notes that it might be a little bit better. Saw neurology who are working with her on that. To have MRI next week.  Duration: chronic, better, but situationaly workse Anxious mood: yes  Excessive worrying: no Irritability: yes  Sweating: no Nausea: no Palpitations:no Hyperventilation: no Panic attacks: no Agoraphobia: no  Obscessions/compulsions: no Depressed mood: no [facility] screen [facility] 01/06/2015 01/06/2015  Decreased Interest 0 0  Down, Depressed, Hopeless 0 0  PHQ - 2 Score 0 0  Altered sleeping 0 -  Tired, decreased energy 1 -  Change in appetite 0 -  Feeling bad or failure about yourself  1 -  Trouble concentrating 0 -  Moving slowly or fidgety/restless 1 -  Suicidal thoughts 0 -  PHQ-9 Score 3 -   Anhedonia: no Weight changes: no Insomnia: no   Hypersomnia: no Fatigue/loss of energy: no Feelings of worthlessness: no Feelings of guilt: no Impaired concentration/indecisiveness: yes Suicidal ideations: no  Crying spells: no Recent Stressors/Life Changes: yes  URINARY SYMPTOMS/Vaginal Atrophy- doing a lot better. She notes that she is feeling better. Not burning. Can pee again. Not interested in hormonal medication at this time. Doing well with the lubricants.  Dysuria: no Urinary frequency: yes Urgency: yes Small volume voids: no Symptom severity:  mild Urinary incontinence: no Foul odor: no Hematuria: no Abdominal pain: no Back pain: no Suprapubic pain/pressure: no Flank pain: no Fever:  no Vomiting: no  Relevant past medical, surgical, family and social history reviewed and updated as indicated. Interim medical history since our last visit reviewed. Allergies and medications reviewed and updated.  Review of Systems  Constitutional: Negative.   Respiratory: Negative.   Cardiovascular: Negative.   Gastrointestinal: Negative.   Genitourinary: Negative.   Psychiatric/Behavioral: Negative.     Per HPI unless specifically indicated above     Objective:    BP 147/84 mmHg  Pulse 87  Temp(Src) 97.9 F (36.6 C)  Ht 5' 2.2" (1.58 m)  Wt 202 lb (91.627 kg)  BMI 36.70 kg/m2  SpO2 96%  Wt Readings from Last 3 Encounters:  02/13/15 202 lb (91.627 kg)  01/13/15 206 lb (93.441 kg)  01/06/15 206 lb (93.441 kg)    Physical Exam  Constitutional: She is oriented to person, place, and time. She appears well-developed and well-nourished. No distress.  HENT:  Head: Normocephalic and atraumatic.  Right Ear: Hearing normal.  Left Ear: Hearing normal.  Nose: Nose normal.  Eyes: Conjunctivae and lids are normal. Right eye exhibits no discharge. Left eye exhibits no discharge. No scleral icterus.  Cardiovascular: Normal rate, regular rhythm, normal heart sounds and intact distal pulses.  Exam reveals no gallop and no friction rub.   No murmur heard. Pulmonary/Chest: Effort normal and breath sounds normal. No respiratory  distress. She has no wheezes. She has no rales. She exhibits no tenderness.  Musculoskeletal: Normal range of motion.  Neurological: She is alert and oriented to person, place, and time.  Skin: Skin is warm, dry and intact. No rash noted. No erythema. No pallor.  Psychiatric: She has a normal mood and affect. Her speech is normal and behavior is normal. Judgment and thought content normal. Cognition and memory are  normal.  Nursing note and vitals reviewed.   Results for orders placed or performed in visit on 01/06/15  Microscopic Examination  Result Value Ref Range   WBC, UA 6-10 (A) 0 -  5 /hpf   RBC, UA 0-2 0 -  2 /hpf   Epithelial Cells (non renal) 0-10 0 - 10 /hpf   Mucus, UA Present Not Estab.   Bacteria, UA Few None seen/Few  HM MAMMOGRAPHY  Result Value Ref Range   HM Mammogram Normal   HM DEXA SCAN  Result Value Ref Range   HM Dexa Scan Osteoporosis   HIV antibody  Result Value Ref Range   HIV Screen 4th Generation wRfx Non Reactive Non Reactive  Hepatitis C antibody  Result Value Ref Range   Hep C Virus Ab <0.1 0.0 - 0.9 s/co ratio  Comprehensive metabolic panel  Result Value Ref Range   Glucose 102 (H) 65 - 99 mg/dL   BUN 14 8 - 27 mg/dL   Creatinine, Ser 1.19 0.57 - 1.00 mg/dL   GFR calc non Af Amer 72 >59 mL/min/1.73   GFR calc Af Amer 83 >59 mL/min/1.73   BUN/Creatinine Ratio 16 11 - 26   Sodium 141 136 - 144 mmol/L   Potassium 3.9 3.5 - 5.2 mmol/L   Chloride 98 97 - 106 mmol/L   CO2 26 18 - 29 mmol/L   Calcium 9.2 8.7 - 10.3 mg/dL   Total Protein 6.5 6.0 - 8.5 g/dL   Albumin 4.3 3.6 - 4.8 g/dL   Globulin, Total 2.2 1.5 - 4.5 g/dL   Albumin/Globulin Ratio 2.0 1.1 - 2.5   Bilirubin Total 0.4 0.0 - 1.2 mg/dL   Alkaline Phosphatase 85 39 - 117 IU/L   AST 27 0 - 40 IU/L   ALT 30 0 - 32 IU/L  CBC with Differential/Platelet  Result Value Ref Range   WBC 5.5 3.4 - 10.8 x10E3/uL   RBC 4.91 3.77 - 5.28 x10E6/uL   Hemoglobin 13.9 11.1 - 15.9 g/dL   Hematocrit 14.7 82.9 - 46.6 %   MCV 85 79 - 97 fL   MCH 28.3 26.6 - 33.0 pg   MCHC 33.5 31.5 - 35.7 g/dL   RDW 56.2 13.0 - 86.5 %   Platelets 229 150 - 379 x10E3/uL   Neutrophils 64 %   Lymphs 25 %   Monocytes 8 %   Eos 2 %   Basos 1 %   Neutrophils Absolute 3.6 1.4 - 7.0 x10E3/uL   Lymphocytes Absolute 1.4 0.7 - 3.1 x10E3/uL   Monocytes Absolute 0.4 0.1 - 0.9 x10E3/uL   EOS (ABSOLUTE) 0.1 0.0 - 0.4 x10E3/uL    Basophils Absolute 0.0 0.0 - 0.2 x10E3/uL   Immature Granulocytes 0 %   Immature Grans (Abs) 0.0 0.0 - 0.1 x10E3/uL  Microalbumin, Urine Waived  Result Value Ref Range   Microalb, Ur Waived 30 (H) 0 - 19 mg/L   Creatinine, Urine Waived 100 10 - 300 mg/dL   Microalb/Creat Ratio <30 <30 mg/g  Lipid Panel w/o Chol/HDL Ratio  Result Value  Ref Range   Cholesterol, Total 196 100 - 199 mg/dL   Triglycerides 621 (H) 0 - 149 mg/dL   HDL 38 (L) >30 mg/dL   VLDL Cholesterol Cal 51 (H) 5 - 40 mg/dL   LDL Calculated 865 (H) 0 - 99 mg/dL  TSH  Result Value Ref Range   TSH 0.662 0.450 - 4.500 uIU/mL  UA/M w/rflx Culture, Routine  Result Value Ref Range   Urine Culture, Routine Final report (A)    Urine Culture result 1 Escherichia coli (A)    ANTIMICROBIAL SUSCEPTIBILITY Comment   VITAMIN D 25 Hydroxy (Vit-D Deficiency, Fractures)  Result Value Ref Range   Vit D, 25-Hydroxy 15.8 (L) 30.0 - 100.0 ng/mL  Hgb A1c w/o eAG  Result Value Ref Range   Hgb A1c MFr Bld 5.7 (H) 4.8 - 5.6 %  Urine Culture, Routine  Result Value Ref Range   Urine Culture result 1 Escherichia coli (A)   HM COLONOSCOPY  Result Value Ref Range   HM Colonoscopy normal       Assessment & Plan:   Problem List Items Addressed This Visit      Genitourinary   Vaginal atrophy    Doing well on the lubricants. Not interested in hormones at this time. Continue to monitor. Call if not working any more or would like hormones.         Other   Depression - Primary    Doing a bit better on her medication. Continue current regimen. Continue to monitor. Recheck 6 months.       Relevant Medications   venlafaxine XR (EFFEXOR-XR) 75 MG 24 hr capsule   buPROPion (WELLBUTRIN XL) 300 MG 24 hr tablet   Hyperlipidemia    Elevated at neurologist, but not fasting. Will check again today and treat if needed.       Relevant Medications   hydrochlorothiazide (HYDRODIURIL) 25 MG tablet   Other Relevant Orders   Lipid Panel w/o  Chol/HDL Ratio       Follow up plan: Return in about 6 months (around 08/13/2015).

## 2015-02-13 NOTE — Patient Instructions (Signed)

## 2015-02-13 NOTE — Assessment & Plan Note (Signed)
Doing a bit better on her medication. Continue current regimen. Continue to monitor. Recheck 6 months.

## 2015-02-14 ENCOUNTER — Encounter: Payer: Self-pay | Admitting: Family Medicine

## 2015-02-14 LAB — LIPID PANEL W/O CHOL/HDL RATIO
Cholesterol, Total: 173 mg/dL (ref 100–199)
HDL: 45 mg/dL (ref >39)
LDL Calculated: 100 mg/dL — ABNORMAL HIGH (ref 0–99)
Triglycerides: 140 mg/dL (ref 0–149)
VLDL Cholesterol Cal: 28 mg/dL (ref 5–40)

## 2015-02-24 ENCOUNTER — Ambulatory Visit
Admission: RE | Admit: 2015-02-24 | Discharge: 2015-02-24 | Disposition: A | Discharge status: Home / Self Care | Payer: Medicare Other | Source: Ambulatory Visit | Attending: Neurology | Admitting: Neurology

## 2015-02-24 DIAGNOSIS — R413 Other amnesia: Secondary | ICD-10-CM | POA: Diagnosis not present

## 2015-02-27 ENCOUNTER — Ambulatory Visit
Admission: RE | Admit: 2015-02-27 | Discharge: 2015-02-27 | Disposition: A | Discharge status: Home / Self Care | Payer: Medicare Other | Source: Ambulatory Visit | Attending: Family Medicine | Admitting: Family Medicine

## 2015-02-27 ENCOUNTER — Telehealth: Payer: Self-pay | Admitting: Family Medicine

## 2015-02-27 DIAGNOSIS — M810 Age-related osteoporosis without current pathological fracture: Secondary | ICD-10-CM | POA: Insufficient documentation

## 2015-02-27 DIAGNOSIS — Z1231 Encounter for screening mammogram for malignant neoplasm of breast: Secondary | ICD-10-CM | POA: Insufficient documentation

## 2015-02-27 DIAGNOSIS — Z1239 Encounter for other screening for malignant neoplasm of breast: Secondary | ICD-10-CM

## 2015-02-27 NOTE — Telephone Encounter (Signed)
Called [name] [name] to give her the results of her bone density. Back is better, but hip still has osteoporosis- want to see if she ever took fosamax in the past. If not, we can start her on it, if she has, she just keeps up her vitamin D and calcium and we'll keep monitoring it.

## 2015-02-28 MED ORDER — ALENDRONATE SODIUM 70 MG PO TABS: 70.0000 mg | ORAL_TABLET | ORAL | Status: DC

## 2015-02-28 NOTE — Telephone Encounter (Signed)
Patient notified, please send the medication to [facility].

## 2015-02-28 NOTE — Telephone Encounter (Signed)
Rx sent to her pharmacy. Never took bisphosphinate. Will take for 5 years

## 2015-02-28 NOTE — Telephone Encounter (Signed)
[name], can you try giving [name] [name] a call for me? I missed her yesterday.

## 2015-03-03 ENCOUNTER — Encounter: Payer: Self-pay | Admitting: Family Medicine

## 2015-03-26 ENCOUNTER — Telehealth: Payer: Self-pay | Admitting: Family Medicine

## 2015-03-26 NOTE — Telephone Encounter (Signed)
Pt would like a call back about her results from her MRI.

## 2015-03-27 NOTE — Telephone Encounter (Signed)
I didn't order an MRI for her. If she got it from her neurologist, they should discuss the results with her.

## 2015-03-27 NOTE — Telephone Encounter (Signed)
Patient notified

## 2015-03-27 NOTE — Telephone Encounter (Signed)
Forward to provider

## 2015-05-26 DIAGNOSIS — G4733 Obstructive sleep apnea (adult) (pediatric): Secondary | ICD-10-CM | POA: Diagnosis not present

## 2015-05-26 DIAGNOSIS — R413 Other amnesia: Secondary | ICD-10-CM | POA: Diagnosis not present

## 2015-08-02 ENCOUNTER — Other Ambulatory Visit: Payer: Self-pay | Admitting: Family Medicine

## 2015-08-13 ENCOUNTER — Ambulatory Visit (INDEPENDENT_AMBULATORY_CARE_PROVIDER_SITE_OTHER): Payer: Medicare Other | Admitting: Family Medicine

## 2015-08-13 ENCOUNTER — Encounter: Payer: Self-pay | Admitting: Family Medicine

## 2015-08-13 ENCOUNTER — Other Ambulatory Visit: Payer: Self-pay | Admitting: Family Medicine

## 2015-08-13 VITALS — BP 134/86 | HR 74 | Temp 98.2°F | Ht 62.7 in | Wt 188.0 lb

## 2015-08-13 DIAGNOSIS — E559 Vitamin D deficiency, unspecified: Secondary | ICD-10-CM

## 2015-08-13 DIAGNOSIS — F329 Major depressive disorder, single episode, unspecified: Secondary | ICD-10-CM

## 2015-08-13 DIAGNOSIS — E785 Hyperlipidemia, unspecified: Secondary | ICD-10-CM | POA: Diagnosis not present

## 2015-08-13 DIAGNOSIS — F32A Depression, unspecified: Secondary | ICD-10-CM

## 2015-08-13 DIAGNOSIS — I10 Essential (primary) hypertension: Secondary | ICD-10-CM

## 2015-08-13 DIAGNOSIS — R7301 Impaired fasting glucose: Secondary | ICD-10-CM

## 2015-08-13 DIAGNOSIS — Z23 Encounter for immunization: Secondary | ICD-10-CM

## 2015-08-13 LAB — LIPID PANEL PICCOLO, WAIVED
Chol/HDL Ratio Piccolo,Waive: 4.6 mg/dL
Cholesterol Piccolo, Waived: 215 mg/dL — ABNORMAL HIGH (ref <200)
HDL CHOL PICCOLO, WAIVED: 47 mg/dL — AB (ref >59)
LDL CHOL CALC PICCOLO WAIVED: 135 mg/dL — AB (ref <100)
Triglycerides Piccolo,Waived: 164 mg/dL — ABNORMAL HIGH (ref <150)
VLDL CHOL CALC PICCOLO,WAIVE: 33 mg/dL — AB (ref <30)

## 2015-08-13 LAB — MICROALBUMIN, URINE WAIVED
CREATININE, URINE WAIVED: 100 mg/dL (ref 10–300)
MICROALB, UR WAIVED: 10 mg/L (ref 0–19)

## 2015-08-13 LAB — BAYER DCA HB A1C WAIVED: HB A1C (BAYER DCA - WAIVED): 5.7 % (ref <7.0)

## 2015-08-13 MED ORDER — BUPROPION HCL ER (XL) 300 MG PO TB24: ORAL_TABLET | ORAL | Status: DC

## 2015-08-13 MED ORDER — HYDROCHLOROTHIAZIDE 25 MG PO TABS: 25.0000 mg | ORAL_TABLET | Freq: Every day | ORAL | Status: DC

## 2015-08-13 MED ORDER — VENLAFAXINE HCL ER 75 MG PO CP24: 75.0000 mg | ORAL_CAPSULE | Freq: Every day | ORAL | Status: DC

## 2015-08-13 NOTE — Assessment & Plan Note (Signed)
Rechecking levels today. Await results and treat as needed.  

## 2015-08-13 NOTE — Progress Notes (Signed)
BP 134/86 mmHg  Pulse 74  Temp(Src) 98.2 F (36.8 C)  Ht 5' 2.7" (1.593 m)  Wt 188 lb (85.276 kg)  BMI 33.60 kg/m2  SpO2 98%   Subjective:    Patient ID: [name] [name], female    DOB: 1950-07-01, 65 y.o.   MRN: [id number]  HPI: [name] [name] is a 65 y.o. female  Chief Complaint  Patient presents with  . Depression  . Hyperlipidemia   HYPERTENSION / HYPERLIPIDEMIA Satisfied with current treatment? yes Duration of hypertension: chronic BP monitoring frequency: not checking BP medication side effects: no Past BP meds: HCTZ Duration of hyperlipidemia: chronic Cholesterol medication side effects: Not on anything Cholesterol supplements: none Past cholesterol medications: none Medication compliance: excellent compliance Aspirin: no Recent stressors: no Recurrent headaches: no Visual changes: no Palpitations: no Dyspnea: no Chest pain: no Lower extremity edema: no Dizzy/lightheaded: no  DEPRESSION- has been doing exercise. Has been doing really well, making her feel better for about 3 months Mood status: better Satisfied with current treatment?: yes Symptom severity: mild  Duration of current treatment : months Side effects: no Medication compliance: excellent compliance Psychotherapy/counseling: no  Depressed mood: no Anxious mood: no Anhedonia: no Significant weight loss or gain: yes- with effort Insomnia: no  Fatigue: yes Feelings of worthlessness or guilt: no Impaired concentration/indecisiveness: no Suicidal ideations: no Hopelessness: no Crying spells: no Depression screen [facility] 2/[facility] 08/13/2015 01/06/2015 01/06/2015  Decreased Interest 1 0 0  Down, Depressed, Hopeless 1 0 0  PHQ - 2 Score 2 0 0  Altered sleeping - 0 -  Tired, decreased energy - 1 -  Change in appetite - 0 -  Feeling bad or failure about yourself  - 1 -  Trouble concentrating - 0 -  Moving slowly or fidgety/restless - 1 -  Suicidal thoughts - 0 -  PHQ-9 Score - 3 -    Relevant past  medical, surgical, family and social history reviewed and updated as indicated. Interim medical history since our last visit reviewed. Allergies and medications reviewed and updated.  Review of Systems  Constitutional: Negative.   Respiratory: Negative.   Cardiovascular: Negative.   Psychiatric/Behavioral: Negative.     Per HPI unless specifically indicated above     Objective:    BP 134/86 mmHg  Pulse 74  Temp(Src) 98.2 F (36.8 C)  Ht 5' 2.7" (1.593 m)  Wt 188 lb (85.276 kg)  BMI 33.60 kg/m2  SpO2 98%  Wt Readings from Last 3 Encounters:  08/13/15 188 lb (85.276 kg)  02/13/15 202 lb (91.627 kg)  01/13/15 206 lb (93.441 kg)    Physical Exam  Constitutional: She is oriented to person, place, and time. She appears well-developed and well-nourished. No distress.  HENT:  Head: Normocephalic and atraumatic.  Right Ear: Hearing normal.  Left Ear: Hearing normal.  Nose: Nose normal.  Eyes: Conjunctivae and lids are normal. Right eye exhibits no discharge. Left eye exhibits no discharge. No scleral icterus.  Cardiovascular: Normal rate, regular rhythm, normal heart sounds and intact distal pulses.  Exam reveals no gallop and no friction rub.   No murmur heard. Pulmonary/Chest: Effort normal and breath sounds normal. No respiratory distress. She has no wheezes. She has no rales. She exhibits no tenderness.  Musculoskeletal: Normal range of motion.  Neurological: She is alert and oriented to person, place, and time.  Skin: Skin is warm, dry and intact. No rash noted. She is not diaphoretic. No erythema. No pallor.  Psychiatric: She has a normal  mood and affect. Her speech is normal and behavior is normal. Judgment and thought content normal. Cognition and memory are normal.  Nursing note and vitals reviewed.   Results for orders placed or performed in visit on 02/13/15  Lipid Panel w/o Chol/HDL Ratio  Result Value Ref Range   Cholesterol, Total 173 100 - 199 mg/dL    Triglycerides 130 0 - 149 mg/dL   HDL 45 >86 mg/dL   VLDL Cholesterol Cal 28 5 - 40 mg/dL   LDL Calculated 578 (H) 0 - 99 mg/dL      Assessment & Plan:   Problem List Items Addressed This Visit      Cardiovascular and Mediastinum   Hypertension - Primary    Under good control. Continue diet and exercise. Recheck 6 months. Call with concerns. Refill given.      Relevant Medications   hydrochlorothiazide (HYDRODIURIL) 25 MG tablet     Endocrine   IFG (impaired fasting glucose)    Under good control. Continue diet and exercise. Recheck 6 months. Call with concerns.         Other   Depression    Under good control. Continue diet and exercise. Recheck 6 months. Call with concerns. Refills given today.      Relevant Medications   venlafaxine XR (EFFEXOR-XR) 75 MG 24 hr capsule   buPROPion (WELLBUTRIN XL) 300 MG 24 hr tablet   Hyperlipidemia    Under good control. Continue diet and exercise. Recheck 6 months. Call with concerns.       Relevant Medications   hydrochlorothiazide (HYDRODIURIL) 25 MG tablet   Vitamin D deficiency    Rechecking levels today. Await results and treat as needed.        Other Visit Diagnoses    Need for Tdap vaccination        Tdap given today.         Follow up plan: Return in about 6 months (around 02/13/2016) for wellness.

## 2015-08-13 NOTE — Assessment & Plan Note (Signed)
Under good control. Continue diet and exercise. Recheck 6 months. Call with concerns. Refills given today.

## 2015-08-13 NOTE — Assessment & Plan Note (Signed)
Under good control. Continue diet and exercise. Recheck 6 months. Call with concerns. Refill given.

## 2015-08-13 NOTE — Patient Instructions (Signed)
Tdap Vaccine (Tetanus, Diphtheria and Pertussis): What You Need to Know 1. Why get vaccinated? Tetanus, diphtheria and pertussis are very serious diseases. Tdap vaccine can protect us from these diseases. And, Tdap vaccine given to pregnant women can protect newborn babies against pertussis. TETANUS (Lockjaw) is rare in the [place] today. It causes painful muscle tightening and stiffness, usually all over the body.  It can lead to tightening of muscles in the head and neck so you can't open your mouth, swallow, or sometimes even breathe. Tetanus kills about 1 out of 10 people who are infected even after receiving the best medical care. DIPHTHERIA is also rare in the [place] today. It can cause a thick coating to form in the back of the throat.  It can lead to breathing problems, heart failure, paralysis, and death. PERTUSSIS (Whooping Cough) causes severe coughing spells, which can cause difficulty breathing, vomiting and disturbed sleep.  It can also lead to weight loss, incontinence, and rib fractures. Up to 2 in 100 adolescents and 5 in 100 adults with pertussis are hospitalized or have complications, which could include pneumonia or death. These diseases are caused by bacteria. Diphtheria and pertussis are spread from person to person through secretions from coughing or sneezing. Tetanus enters the body through cuts, scratches, or wounds. Before vaccines, as many as 200,000 cases of diphtheria, 200,000 cases of pertussis, and hundreds of cases of tetanus, were reported in the [place] each year. Since vaccination began, reports of cases for tetanus and diphtheria have dropped by about 99% and for pertussis by about 80%. 2. Tdap vaccine Tdap vaccine can protect adolescents and adults from tetanus, diphtheria, and pertussis. One dose of Tdap is routinely given at age 11 or 12. People who did not get Tdap at that age should get it as soon as possible. Tdap is especially important  for healthcare professionals and anyone having close contact with a baby younger than 12 months. Pregnant women should get a dose of Tdap during every pregnancy, to protect the newborn from pertussis. Infants are most at risk for severe, life-threatening complications from pertussis. Another vaccine, called Td, protects against tetanus and diphtheria, but not pertussis. A Td booster should be given every 10 years. Tdap may be given as one of these boosters if you have never gotten Tdap before. Tdap may also be given after a severe cut or burn to prevent tetanus infection. Your doctor or the person giving you the vaccine can give you more information. Tdap may safely be given at the same time as other vaccines. 3. Some people should not get this vaccine  A person who has ever had a life-threatening allergic reaction after a previous dose of any diphtheria, tetanus or pertussis containing vaccine, OR has a severe allergy to any part of this vaccine, should not get Tdap vaccine. Tell the person giving the vaccine about any severe allergies.  Anyone who had coma or long repeated seizures within 7 days after a childhood dose of DTP or DTaP, or a previous dose of Tdap, should not get Tdap, unless a cause other than the vaccine was found. They can still get Td.  Talk to your doctor if you:  have seizures or another nervous system problem,  had severe pain or swelling after any vaccine containing diphtheria, tetanus or pertussis,  ever had a condition called Guillain-Barr Syndrome (GBS),  aren't feeling well on the day the shot is scheduled. 4. Risks With any medicine, including vaccines, there is   a chance of side effects. These are usually mild and go away on their own. Serious reactions are also possible but are rare. Most people who get Tdap vaccine do not have any problems with it. Mild problems following Tdap (Did not interfere with activities)  Pain where the shot was given (about 3 in 4  adolescents or 2 in 3 adults)  Redness or swelling where the shot was given (about 1 person in 5)  Mild fever of at least 100.4F (up to about 1 in 25 adolescents or 1 in 100 adults)  Headache (about 3 or 4 people in 10)  Tiredness (about 1 person in 3 or 4)  Nausea, vomiting, diarrhea, stomach ache (up to 1 in 4 adolescents or 1 in 10 adults)  Chills, sore joints (about 1 person in 10)  Body aches (about 1 person in 3 or 4)  Rash, swollen glands (uncommon) Moderate problems following Tdap (Interfered with activities, but did not require medical attention)  Pain where the shot was given (up to 1 in 5 or 6)  Redness or swelling where the shot was given (up to about 1 in 16 adolescents or 1 in 12 adults)  Fever over 102F (about 1 in 100 adolescents or 1 in 250 adults)  Headache (about 1 in 7 adolescents or 1 in 10 adults)  Nausea, vomiting, diarrhea, stomach ache (up to 1 or 3 people in 100)  Swelling of the entire arm where the shot was given (up to about 1 in 500). Severe problems following Tdap (Unable to perform usual activities; required medical attention)  Swelling, severe pain, bleeding and redness in the arm where the shot was given (rare). Problems that could happen after any vaccine:  People sometimes faint after a medical procedure, including vaccination. Sitting or lying down for about 15 minutes can help prevent fainting, and injuries caused by a fall. Tell your doctor if you feel dizzy, or have vision changes or ringing in the ears.  Some people get severe pain in the shoulder and have difficulty moving the arm where a shot was given. This happens very rarely.  Any medication can cause a severe allergic reaction. Such reactions from a vaccine are very rare, estimated at fewer than 1 in a million doses, and would happen within a few minutes to a few hours after the vaccination. As with any medicine, there is a very remote chance of a vaccine causing a serious  injury or death. The safety of vaccines is always being monitored. For more information, visit: www.cdc.gov/vaccinesafety/ 5. What if there is a serious problem? What should I look for?  Look for anything that concerns you, such as signs of a severe allergic reaction, very high fever, or unusual behavior.  Signs of a severe allergic reaction can include hives, swelling of the face and throat, difficulty breathing, a fast heartbeat, dizziness, and weakness. These would usually start a few minutes to a few hours after the vaccination. What should I do?  If you think it is a severe allergic reaction or other emergency that can't wait, call 9-1-1 or get the person to the nearest hospital. Otherwise, call your doctor.  Afterward, the reaction should be reported to the Vaccine Adverse Event Reporting System (VAERS). Your doctor might file this report, or you can do it yourself through the VAERS web site at www.vaers.hhs.gov, or by calling 1-800-822-7967. VAERS does not give medical advice.  6. The National Vaccine Injury Compensation Program The National Vaccine Injury Compensation Program (  VICP) is a federal program that was created to compensate people who may have been injured by certain vaccines. Persons who believe they may have been injured by a vaccine can learn about the program and about filing a claim by calling 1-800-338-2382 or visiting the VICP website at www.hrsa.gov/vaccinecompensation. There is a time limit to file a claim for compensation. 7. How can I learn more?  Ask your doctor. He or she can give you the vaccine package insert or suggest other sources of information.  Call your local or state health department.  Contact the Centers for Disease Control and Prevention (CDC):  Call 1-800-232-4636 (1-800-CDC-INFO) or  Visit CDC's website at www.cdc.gov/vaccines CDC Tdap Vaccine VIS (03/27/13)   This information is not intended to replace advice given to you by your health care  provider. Make sure you discuss any questions you have with your health care provider.   Document Released: 07/20/2011 Document Revised: 02/08/2014 Document Reviewed: 05/02/2013 Elsevier Interactive Patient Education 2016 Elsevier Inc.  

## 2015-08-13 NOTE — Assessment & Plan Note (Signed)
Under good control. Continue diet and exercise. Recheck 6 months. Call with concerns.  

## 2015-08-13 NOTE — Assessment & Plan Note (Signed)
Under good control. Continue diet and exercise. Recheck 6 months. Call with concerns.

## 2015-08-14 ENCOUNTER — Telehealth: Payer: Self-pay | Admitting: Family Medicine

## 2015-08-14 LAB — COMPREHENSIVE METABOLIC PANEL
ALT: 17 IU/L (ref 0–32)
AST: 21 IU/L (ref 0–40)
Albumin/Globulin Ratio: 1.9 (ref 1.2–2.2)
Albumin: 4.1 g/dL (ref 3.6–4.8)
Alkaline Phosphatase: 58 IU/L (ref 39–117)
BUN/Creatinine Ratio: 22 (ref 12–28)
BUN: 17 mg/dL (ref 8–27)
Bilirubin Total: 0.5 mg/dL (ref 0.0–1.2)
CALCIUM: 9 mg/dL (ref 8.7–10.3)
CO2: 23 mmol/L (ref 18–29)
Chloride: 101 mmol/L (ref 96–106)
Creatinine, Ser: 0.79 mg/dL (ref 0.57–1.00)
GFR, EST AFRICAN AMERICAN: 90 mL/min/{1.73_m2} (ref >59)
GFR, EST NON AFRICAN AMERICAN: 78 mL/min/{1.73_m2} (ref >59)
GLUCOSE: 98 mg/dL (ref 65–99)
Globulin, Total: 2.2 g/dL (ref 1.5–4.5)
POTASSIUM: 3.9 mmol/L (ref 3.5–5.2)
Sodium: 142 mmol/L (ref 134–144)
TOTAL PROTEIN: 6.3 g/dL (ref 6.0–8.5)

## 2015-08-14 LAB — VITAMIN D 25 HYDROXY (VIT D DEFICIENCY, FRACTURES): Vit D, 25-Hydroxy: 20.1 ng/mL — ABNORMAL LOW (ref 30.0–100.0)

## 2015-08-14 MED ORDER — VITAMIN D (ERGOCALCIFEROL) 1.25 MG (50000 UNIT) PO CAPS: 50000.0000 [IU] | ORAL_CAPSULE | ORAL | Status: DC

## 2015-08-14 NOTE — Telephone Encounter (Signed)
Spoke with patient, she understands , will pick up Rx

## 2015-08-14 NOTE — Telephone Encounter (Signed)
Can you please let her know that her labs came back normal, but her vit D is still low so I sent though another refill for her. Thanks!

## 2015-10-07 ENCOUNTER — Encounter: Payer: Self-pay | Admitting: Family Medicine

## 2015-10-07 ENCOUNTER — Ambulatory Visit (INDEPENDENT_AMBULATORY_CARE_PROVIDER_SITE_OTHER): Payer: Medicare Other | Admitting: Family Medicine

## 2015-10-07 VITALS — BP 120/77 | HR 78 | Wt 190.0 lb

## 2015-10-07 DIAGNOSIS — L551 Sunburn of second degree: Secondary | ICD-10-CM

## 2015-10-07 MED ORDER — PREDNISONE 10 MG PO TABS: ORAL_TABLET | ORAL | 0 refills | Status: DC

## 2015-10-07 NOTE — Progress Notes (Signed)
BP 120/77 (BP Location: Left Arm, Patient Position: Sitting, Cuff Size: Normal)   Pulse 78   Wt 190 lb (86.2 kg)   SpO2 97%   BMI 33.98 kg/m    Subjective:    Patient ID: [name] [name], female    DOB: [birth year], 65 y.o.   MRN: [id number]  HPI: [name] [name] is a 65 y.o. female  Chief Complaint  Patient presents with  . possible sun poisoning    itching,red bumps  Patient with sunburn this last weekend itching redness with some blistering on chest back and arms and shoulders Reviewed sun protection  Relevant past medical, surgical, family and social history reviewed and updated as indicated. Interim medical history since our last visit reviewed. Allergies and medications reviewed and updated.  Review of Systems  Per HPI unless specifically indicated above     Objective:    BP 120/77 (BP Location: Left Arm, Patient Position: Sitting, Cuff Size: Normal)   Pulse 78   Wt 190 lb (86.2 kg)   SpO2 97%   BMI 33.98 kg/m   Wt Readings from Last 3 Encounters:  10/07/15 190 lb (86.2 kg)  08/13/15 188 lb (85.3 kg)  02/13/15 202 lb (91.6 kg)    Physical Exam  Constitutional: She is oriented to person, place, and time. She appears well-developed and well-nourished. No distress.  HENT:  Head: Normocephalic and atraumatic.  Right Ear: Hearing normal.  Left Ear: Hearing normal.  Nose: Nose normal.  Eyes: Conjunctivae and lids are normal. Right eye exhibits no discharge. Left eye exhibits no discharge. No scleral icterus.  Pulmonary/Chest: Effort normal. No respiratory distress.  Musculoskeletal: Normal range of motion.  Neurological: She is alert and oriented to person, place, and time.  Skin: Skin is intact. No rash noted.  Sunburn changes chest back shoulders and arms  Psychiatric: She has a normal mood and affect. Her speech is normal and behavior is normal. Judgment and thought content normal. Cognition and memory are normal.    Results for orders placed or performed  in visit on 08/13/15  Bayer DCA Hb A1c Waived  Result Value Ref Range   Bayer DCA Hb A1c Waived 5.7 <7.0 %  Comprehensive metabolic panel  Result Value Ref Range   Glucose 98 65 - 99 mg/dL   BUN 17 8 - 27 mg/dL   Creatinine, Ser 4.090.79 0.57 - 1.00 mg/dL   GFR calc non Af Amer 78 >59 mL/min/1.73   GFR calc Af Amer 90 >59 mL/min/1.73   BUN/Creatinine Ratio 22 12 - 28   Sodium 142 134 - 144 mmol/L   Potassium 3.9 3.5 - 5.2 mmol/L   Chloride 101 96 - 106 mmol/L   CO2 23 18 - 29 mmol/L   Calcium 9.0 8.7 - 10.3 mg/dL   Total Protein 6.3 6.0 - 8.5 g/dL   Albumin 4.1 3.6 - 4.8 g/dL   Globulin, Total 2.2 1.5 - 4.5 g/dL   Albumin/Globulin Ratio 1.9 1.2 - 2.2   Bilirubin Total 0.5 0.0 - 1.2 mg/dL   Alkaline Phosphatase 58 39 - 117 IU/L   AST 21 0 - 40 IU/L   ALT 17 0 - 32 IU/L  Lipid Panel Piccolo, Waived  Result Value Ref Range   Cholesterol Piccolo, Waived 215 (H) <200 mg/dL   HDL Chol Piccolo, Waived 47 (L) >59 mg/dL   Triglycerides Piccolo,Waived 164 (H) <150 mg/dL   Chol/HDL Ratio Piccolo,Waive 4.6 mg/dL   LDL Chol Calc Piccolo Waived 135 (H) <100  mg/dL   VLDL Chol Calc Piccolo,Waive 33 (H) <30 mg/dL  Microalbumin, Urine Waived  Result Value Ref Range   Microalb, Ur Waived 10 0 - 19 mg/L   Creatinine, Urine Waived 100 10 - 300 mg/dL   Microalb/Creat Ratio <30 <30 mg/g  VITAMIN D 25 Hydroxy (Vit-D Deficiency, Fractures)  Result Value Ref Range   Vit D, 25-Hydroxy 20.1 (L) 30.0 - 100.0 ng/mL  HM COLONOSCOPY  Result Value Ref Range   HM Colonoscopy Patient Reported Normal See Report, Patient Reported Normal      Assessment & Plan:   Problem List Items Addressed This Visit    None    Visit Diagnoses    Sunburn of second degree    -  Primary   Discussed care and treatment use of prednisone prevention of sunburn in the future       Follow up plan: Return for As scheduled.

## 2015-10-14 ENCOUNTER — Other Ambulatory Visit: Payer: Self-pay | Admitting: Family Medicine

## 2015-10-14 NOTE — Telephone Encounter (Signed)
No. If she would need to be seen.

## 2015-10-14 NOTE — Telephone Encounter (Signed)
Routing to provider  

## 2015-10-14 NOTE — Telephone Encounter (Signed)
Pt was seen 10/07/15 for sunburn and she is still itching really bad and would like to know if she could get more predniSONE (DELTASONE) 10 MG tablet [facility] to help.

## 2015-10-15 ENCOUNTER — Encounter: Payer: Self-pay | Admitting: Family Medicine

## 2015-10-15 ENCOUNTER — Ambulatory Visit (INDEPENDENT_AMBULATORY_CARE_PROVIDER_SITE_OTHER): Payer: Medicare Other | Admitting: Family Medicine

## 2015-10-15 VITALS — BP 137/83 | HR 87 | Temp 98.4°F | Ht 64.2 in | Wt 192.2 lb

## 2015-10-15 DIAGNOSIS — L551 Sunburn of second degree: Secondary | ICD-10-CM

## 2015-10-15 MED ORDER — HYDROXYZINE HCL 10 MG PO TABS: 10.0000 mg | ORAL_TABLET | Freq: Three times a day (TID) | ORAL | 0 refills | Status: DC | PRN

## 2015-10-15 MED ORDER — TRIAMCINOLONE ACETONIDE 0.1 % EX CREA: 1.0000 "application " | TOPICAL_CREAM | Freq: Two times a day (BID) | CUTANEOUS | 0 refills | Status: DC

## 2015-10-15 NOTE — Progress Notes (Signed)
   BP 137/83 (BP Location: Right Arm, Patient Position: Sitting, Cuff Size: Large)   Pulse 87   Temp 98.4 F (36.9 C)   Ht 5' 4.2" (1.631 m)   Wt 192 lb 3.2 oz (87.2 kg)   SpO2 96%   BMI 32.79 kg/m    Subjective:    Patient ID: [name] [name], female    DOB: 01-10-1951, 65 y.o.   MRN: [id number]  HPI: [name] [name] is a 65 y.o. female  Chief Complaint  Patient presents with  . Sunburn    pt states she is still itching from sunburn she got about 3 weeks ago, has finished one round of prednisone   Patient presents for follow up of sun poisoning. Was seen last week for severe sunburn on chest, arms, and back and was given 5 day prednisone burst. Has completed that and doing better, but still having severe itching and rash. Has tried benadryl with some relief. Using multiple topical moisturizers and aloe. The itching is keeping her up at night and she is starting to scratch wounds into her arms.   Relevant past medical, surgical, family and social history reviewed and updated as indicated. Interim medical history since our last visit reviewed. Allergies and medications reviewed and updated.  Review of Systems  Constitutional: Negative.   HENT: Negative.   Respiratory: Negative.   Cardiovascular: Negative.   Gastrointestinal: Negative.   Genitourinary: Negative.   Musculoskeletal: Negative.   Skin: Positive for rash.  Neurological: Negative.   Psychiatric/Behavioral: Negative.     Per HPI unless specifically indicated above     Objective:    BP 137/83 (BP Location: Right Arm, Patient Position: Sitting, Cuff Size: Large)   Pulse 87   Temp 98.4 F (36.9 C)   Ht 5' 4.2" (1.631 m)   Wt 192 lb 3.2 oz (87.2 kg)   SpO2 96%   BMI 32.79 kg/m   Wt Readings from Last 3 Encounters:  10/15/15 192 lb 3.2 oz (87.2 kg)  10/07/15 190 lb (86.2 kg)  08/13/15 188 lb (85.3 kg)    Physical Exam  Constitutional: She is oriented to person, place, and time. She appears well-developed  and well-nourished. No distress.  HENT:  Head: Atraumatic.  Eyes: Conjunctivae are normal. No scleral icterus.  Neck: Normal range of motion. Neck supple.  Cardiovascular: Normal rate, regular rhythm and normal heart sounds.   Pulmonary/Chest: Effort normal. No respiratory distress.  Musculoskeletal: Normal range of motion.  Neurological: She is alert and oriented to person, place, and time.  Skin: Skin is warm and dry.  Lingering skin changes from sunburn on b/l arms, chest, and upper back. Some areas are scabbed from scratching  Psychiatric: She has a normal mood and affect. Her behavior is normal.  Nursing note and vitals reviewed.       Assessment & Plan:   Problem List Items Addressed This Visit    None    Visit Diagnoses    Sunburn, second degree    -  Primary   Responded very well to the prednisone, mostly just itching remaining. Will try hydroxyzine and triamcinolone, 2nd prednisone burst only if needed.        Follow up plan: Return if symptoms worsen or fail to improve.

## 2015-10-15 NOTE — Patient Instructions (Signed)
Follow up as needed

## 2015-11-03 ENCOUNTER — Other Ambulatory Visit: Payer: Self-pay | Admitting: Family Medicine

## 2015-11-11 ENCOUNTER — Encounter: Payer: Self-pay | Admitting: Family Medicine

## 2015-11-11 ENCOUNTER — Ambulatory Visit (INDEPENDENT_AMBULATORY_CARE_PROVIDER_SITE_OTHER): Payer: Medicare Other | Admitting: Family Medicine

## 2015-11-11 VITALS — BP 121/77 | HR 83 | Temp 98.2°F | Wt 196.3 lb

## 2015-11-11 DIAGNOSIS — L259 Unspecified contact dermatitis, unspecified cause: Secondary | ICD-10-CM | POA: Diagnosis not present

## 2015-11-11 DIAGNOSIS — L299 Pruritus, unspecified: Secondary | ICD-10-CM

## 2015-11-11 MED ORDER — HYDROXYZINE HCL 25 MG PO TABS: 25.0000 mg | ORAL_TABLET | Freq: Three times a day (TID) | ORAL | 1 refills | Status: DC | PRN

## 2015-11-11 MED ORDER — PREDNISONE 10 MG PO TABS: ORAL_TABLET | ORAL | 0 refills | Status: DC

## 2015-11-11 NOTE — Progress Notes (Signed)
BP 121/77 (BP Location: Left Arm, Patient Position: Sitting, Cuff Size: Large)   Pulse 83   Temp 98.2 F (36.8 C)   Wt 196 lb 4.8 oz (89 kg)   SpO2 95%   BMI 33.49 kg/m    Subjective:    Patient ID: [name] [name], female    DOB: 1950/05/18, 65 y.o.   MRN: [id number]  HPI: [name] [name] is a 65 y.o. female  Chief Complaint  Patient presents with  . Pruritis   Anjulie notes that she has been itching all over for about a month now. She saw Dr. Dossie Arbour at the beginning of September with a bad sunburn and since then has been itchy all over. She did well with prednisone after the first burn, but this feels different. It's all over but especially on her arms and chest. She notes that it's horribly itchy and keeps her up at night. None of the medicine she has been taking has been helping and she has been taking benadryl a couple of times a day. She is otherwise feeling well with no other concerns or complaints at this time.   Relevant past medical, surgical, family and social history reviewed and updated as indicated. Interim medical history since our last visit reviewed. Allergies and medications reviewed and updated.  Review of Systems  Constitutional: Negative.   Respiratory: Negative.   Cardiovascular: Negative.   Skin: Positive for rash. Negative for color change, pallor and wound.  Psychiatric/Behavioral: Negative.     Per HPI unless specifically indicated above     Objective:    BP 121/77 (BP Location: Left Arm, Patient Position: Sitting, Cuff Size: Large)   Pulse 83   Temp 98.2 F (36.8 C)   Wt 196 lb 4.8 oz (89 kg)   SpO2 95%   BMI 33.49 kg/m   Wt Readings from Last 3 Encounters:  11/11/15 196 lb 4.8 oz (89 kg)  10/15/15 192 lb 3.2 oz (87.2 kg)  10/07/15 190 lb (86.2 kg)    Physical Exam  Constitutional: She is oriented to person, place, and time. She appears well-developed and well-nourished. No distress.  HENT:  Head: Normocephalic and atraumatic.  Right  Ear: Hearing normal.  Left Ear: Hearing normal.  Nose: Nose normal.  Eyes: Conjunctivae and lids are normal. Right eye exhibits no discharge. Left eye exhibits no discharge. No scleral icterus.  Cardiovascular: Normal rate, regular rhythm, normal heart sounds and intact distal pulses.  Exam reveals no gallop and no friction rub.   No murmur heard. Pulmonary/Chest: Effort normal and breath sounds normal. No respiratory distress. She has no wheezes. She has no rales. She exhibits no tenderness.  Musculoskeletal: Normal range of motion.  Neurological: She is alert and oriented to person, place, and time.  Skin: Skin is warm, dry and intact. Rash (small vesicular rash on arms with visable excoriation ) noted. No erythema. No pallor.  Psychiatric: She has a normal mood and affect. Her speech is normal and behavior is normal. Judgment and thought content normal. Cognition and memory are normal.  Nursing note and vitals reviewed.   Results for orders placed or performed in visit on 08/13/15  Bayer DCA Hb A1c Waived  Result Value Ref Range   Bayer DCA Hb A1c Waived 5.7 <7.0 %  Comprehensive metabolic panel  Result Value Ref Range   Glucose 98 65 - 99 mg/dL   BUN 17 8 - 27 mg/dL   Creatinine, Ser 4.09 0.57 - 1.00 mg/dL  GFR calc non Af Amer 78 >59 mL/min/1.73   GFR calc Af Amer 90 >59 mL/min/1.73   BUN/Creatinine Ratio 22 12 - 28   Sodium 142 134 - 144 mmol/L   Potassium 3.9 3.5 - 5.2 mmol/L   Chloride 101 96 - 106 mmol/L   CO2 23 18 - 29 mmol/L   Calcium 9.0 8.7 - 10.3 mg/dL   Total Protein 6.3 6.0 - 8.5 g/dL   Albumin 4.1 3.6 - 4.8 g/dL   Globulin, Total 2.2 1.5 - 4.5 g/dL   Albumin/Globulin Ratio 1.9 1.2 - 2.2   Bilirubin Total 0.5 0.0 - 1.2 mg/dL   Alkaline Phosphatase 58 39 - 117 IU/L   AST 21 0 - 40 IU/L   ALT 17 0 - 32 IU/L  Lipid Panel Piccolo, Waived  Result Value Ref Range   Cholesterol Piccolo, Waived 215 (H) <200 mg/dL   HDL Chol Piccolo, Waived 47 (L) >59 mg/dL    Triglycerides Piccolo,Waived 164 (H) <150 mg/dL   Chol/HDL Ratio Piccolo,Waive 4.6 mg/dL   LDL Chol Calc Piccolo Waived 135 (H) <100 mg/dL   VLDL Chol Calc Piccolo,Waive 33 (H) <30 mg/dL  Microalbumin, Urine Waived  Result Value Ref Range   Microalb, Ur Waived 10 0 - 19 mg/L   Creatinine, Urine Waived 100 10 - 300 mg/dL   Microalb/Creat Ratio <30 <30 mg/g  VITAMIN D 25 Hydroxy (Vit-D Deficiency, Fractures)  Result Value Ref Range   Vit D, 25-Hydroxy 20.1 (L) 30.0 - 100.0 ng/mL  HM COLONOSCOPY  Result Value Ref Range   HM Colonoscopy Patient Reported Normal See Report, Patient Reported Normal      Assessment & Plan:   Problem List Items Addressed This Visit    None    Visit Diagnoses    Itching    -  Primary   Seems to be due to her dermaititis. Will check CMP to look for other causes. Increase hydroxyzine. Another burst of prednisone. To see Derm.    Relevant Orders   Ambulatory referral to Dermatology   Comprehensive metabolic panel   Contact dermatitis, unspecified contact dermatitis type, unspecified trigger       Increase hydroxyzine. Another burst of prednisone. To see Derm.    Relevant Orders   Ambulatory referral to Dermatology       Follow up plan: Return As scheduled.

## 2015-11-12 ENCOUNTER — Encounter: Payer: Self-pay | Admitting: Family Medicine

## 2015-11-12 LAB — COMPREHENSIVE METABOLIC PANEL
A/G RATIO: 2 (ref 1.2–2.2)
ALBUMIN: 4.3 g/dL (ref 3.6–4.8)
ALK PHOS: 59 IU/L (ref 39–117)
ALT: 19 IU/L (ref 0–32)
AST: 20 IU/L (ref 0–40)
BUN / CREAT RATIO: 20 (ref 12–28)
BUN: 16 mg/dL (ref 8–27)
Bilirubin Total: 0.3 mg/dL (ref 0.0–1.2)
CALCIUM: 8.7 mg/dL (ref 8.7–10.3)
CO2: 26 mmol/L (ref 18–29)
Chloride: 103 mmol/L (ref 96–106)
Creatinine, Ser: 0.8 mg/dL (ref 0.57–1.00)
GFR calc Af Amer: 89 mL/min/{1.73_m2} (ref >59)
GFR, EST NON AFRICAN AMERICAN: 77 mL/min/{1.73_m2} (ref >59)
GLOBULIN, TOTAL: 2.1 g/dL (ref 1.5–4.5)
Glucose: 104 mg/dL — ABNORMAL HIGH (ref 65–99)
POTASSIUM: 4.1 mmol/L (ref 3.5–5.2)
SODIUM: 143 mmol/L (ref 134–144)
Total Protein: 6.4 g/dL (ref 6.0–8.5)

## 2015-11-26 DIAGNOSIS — G4733 Obstructive sleep apnea (adult) (pediatric): Secondary | ICD-10-CM | POA: Diagnosis not present

## 2015-11-26 DIAGNOSIS — R413 Other amnesia: Secondary | ICD-10-CM | POA: Diagnosis not present

## 2016-01-05 ENCOUNTER — Other Ambulatory Visit: Payer: Self-pay | Admitting: Family Medicine

## 2016-01-07 ENCOUNTER — Ambulatory Visit (INDEPENDENT_AMBULATORY_CARE_PROVIDER_SITE_OTHER): Payer: Medicare Other | Admitting: Family Medicine

## 2016-01-07 ENCOUNTER — Encounter: Payer: Self-pay | Admitting: Family Medicine

## 2016-01-07 VITALS — BP 126/84 | HR 76 | Temp 98.0°F | Ht 63.0 in | Wt 196.4 lb

## 2016-01-07 DIAGNOSIS — F324 Major depressive disorder, single episode, in partial remission: Secondary | ICD-10-CM | POA: Diagnosis not present

## 2016-01-07 DIAGNOSIS — M810 Age-related osteoporosis without current pathological fracture: Secondary | ICD-10-CM | POA: Diagnosis not present

## 2016-01-07 DIAGNOSIS — R7301 Impaired fasting glucose: Secondary | ICD-10-CM | POA: Diagnosis not present

## 2016-01-07 DIAGNOSIS — E559 Vitamin D deficiency, unspecified: Secondary | ICD-10-CM | POA: Diagnosis not present

## 2016-01-07 DIAGNOSIS — N952 Postmenopausal atrophic vaginitis: Secondary | ICD-10-CM

## 2016-01-07 DIAGNOSIS — R413 Other amnesia: Secondary | ICD-10-CM

## 2016-01-07 DIAGNOSIS — J301 Allergic rhinitis due to pollen: Secondary | ICD-10-CM | POA: Diagnosis not present

## 2016-01-07 DIAGNOSIS — N3281 Overactive bladder: Secondary | ICD-10-CM | POA: Diagnosis not present

## 2016-01-07 DIAGNOSIS — E782 Mixed hyperlipidemia: Secondary | ICD-10-CM | POA: Diagnosis not present

## 2016-01-07 DIAGNOSIS — Z23 Encounter for immunization: Secondary | ICD-10-CM

## 2016-01-07 DIAGNOSIS — I10 Essential (primary) hypertension: Secondary | ICD-10-CM

## 2016-01-07 DIAGNOSIS — H9192 Unspecified hearing loss, left ear: Secondary | ICD-10-CM

## 2016-01-07 DIAGNOSIS — Z0000 Encounter for general adult medical examination without abnormal findings: Secondary | ICD-10-CM

## 2016-01-07 MED ORDER — VENLAFAXINE HCL ER 150 MG PO CP24: 150.0000 mg | ORAL_CAPSULE | Freq: Every day | ORAL | 3 refills | Status: DC

## 2016-01-07 MED ORDER — BUPROPION HCL ER (XL) 300 MG PO TB24: ORAL_TABLET | ORAL | 1 refills | Status: DC

## 2016-01-07 MED ORDER — HYDROCHLOROTHIAZIDE 25 MG PO TABS: 25.0000 mg | ORAL_TABLET | Freq: Every day | ORAL | 1 refills | Status: DC

## 2016-01-07 NOTE — Assessment & Plan Note (Signed)
6CIT normal today. Continue to monitor.

## 2016-01-07 NOTE — Assessment & Plan Note (Addendum)
A1c stable at 5.6. Continue to watch diet and exercise. Recheck 6 months.

## 2016-01-07 NOTE — Assessment & Plan Note (Signed)
Stable. Continue current regimen. Continue to monitor.  

## 2016-01-07 NOTE — Assessment & Plan Note (Signed)
Stable. Continue current regimen. Continue to monitor. Call with any concerns.  

## 2016-01-07 NOTE — Assessment & Plan Note (Signed)
Did not wear her hearing aids today. Continue to follow with audiology.

## 2016-01-07 NOTE — Assessment & Plan Note (Signed)
Has been on bisphosphonate for 11 months. Continue for 5 years total.

## 2016-01-07 NOTE — Assessment & Plan Note (Addendum)
Not doing great. Will increase effexor to 150mg  and check back in 1 month. Call with any concerns.

## 2016-01-07 NOTE — Assessment & Plan Note (Signed)
Stable on current regimen. Continue current regimen. Refill given. Recheck 6 months. 

## 2016-01-07 NOTE — Progress Notes (Signed)
BP 126/84 (BP Location: Left Arm, Patient Position: Sitting, Cuff Size: Large)   Pulse 76   Temp 98 F (36.7 C)   Ht 5\' 3"  (1.6 m)   Wt 196 lb 6.4 oz (89.1 kg)   SpO2 97%   BMI 34.79 kg/m    Subjective:    Patient ID: [name] [name], female    DOB: 1950-12-16, 65 y.o.   MRN: [id number]  HPI: [name] [name] is a 65 y.o. female presenting on 01/07/2016 for comprehensive medical examination. Current medical complaints include:  DEPRESSION- has been feeling very anxious, has a lot of stuff going on at home, especially with the holidays Mood status: exacerbated Satisfied with current treatment?: yes Symptom severity: mild  Duration of current treatment : chronic Side effects: no Medication compliance: excellent compliance Psychotherapy/counseling: no  Depressed mood: yes Anxious mood: yes Anhedonia: no Significant weight loss or gain: no Insomnia: no  Fatigue: yes Feelings of worthlessness or guilt: yes Impaired concentration/indecisiveness: yes Suicidal ideations: no Hopelessness: no Crying spells: no [facility] screen [facility] 01/07/2016 08/13/2015 01/06/2015 01/06/2015  Decreased Interest 1 1 0 0  Down, Depressed, Hopeless 0 1 0 0  PHQ - 2 Score 1 2 0 0  Altered sleeping - - 0 -  Tired, decreased energy - - 1 -  Change in appetite - - 0 -  Feeling bad or failure about yourself  - - 1 -  Trouble concentrating - - 0 -  Moving slowly or fidgety/restless - - 1 -  Suicidal thoughts - - 0 -  PHQ-9 Score - - 3 -   HYPERTENSION / HYPERLIPIDEMIA Satisfied with current treatment? yes Duration of hypertension: chronic BP monitoring frequency: not checking BP medication side effects: no Duration of hyperlipidemia: chronic Cholesterol medication side effects: Not on anything Cholesterol supplements: none Medication compliance: excellent compliance Aspirin: no Recent stressors: yes Recurrent headaches: no Visual changes: no Palpitations: no Dyspnea: no Chest pain:  no Lower extremity edema: no Dizzy/lightheaded: no  She currently lives with: husband Menopausal Symptoms: no  Functional Status Survey: Is the patient deaf or have difficulty hearing?: Yes Does the patient have difficulty seeing, even when wearing glasses/contacts?: No Does the patient have difficulty concentrating, remembering, or making decisions?: Yes Does the patient have difficulty walking or climbing stairs?: No Does the patient have difficulty dressing or bathing?: No Does the patient have difficulty doing errands alone such as visiting a doctor's office or shopping?: No  Fall Risk  01/07/2016 01/06/2015  Falls in the past year? No Yes  Number falls in past yr: - 2 or more  Injury with Fall? - No   Advanced Directives Does patient have a HCPOA?    yes Does patient have a living will or MOST form?  yes  Past Medical History:  Past Medical History:  Diagnosis Date  . Hypertension partial    Surgical History:  Past Surgical History:  Procedure Laterality Date  . ABDOMINAL HYSTERECTOMY    . CARPAL TUNNEL RELEASE    . CESAREAN SECTION    . CHOLECYSTECTOMY    . HAMMER TOE SURGERY Bilateral     Medications:  Current Outpatient Prescriptions on File Prior to Visit  Medication Sig  . alendronate (FOSAMAX) 70 MG tablet Take 1 tablet (70 mg total) by mouth every 7 (seven) days. Take with a full glass of water on an empty stomach.  . hydrOXYzine (ATARAX/VISTARIL) 25 MG tablet Take 1 tablet (25 mg total) by mouth 3 (three) times daily  as needed.  . Vitamin D, Ergocalciferol, (DRISDOL) 50000 units CAPS capsule Take 1 capsule (50,000 Units total) by mouth every 7 (seven) days.   No current facility-administered medications on file prior to visit.     Allergies:  Allergies  Allergen Reactions  . Codeine Nausea And Vomiting  . Tussionex Pennkinetic Er [Hydrocod Polst-Cpm Polst Er] Nausea And Vomiting    Social History:  Social History   Social History  . Marital  status: Married    Spouse name: N/A  . Number of children: N/A  . Years of education: N/A   Occupational History  . Not on file.   Social History Main Topics  . Smoking status: Never Smoker  . Smokeless tobacco: Never Used  . Alcohol use No  . Drug use: No  . Sexual activity: Not Currently   Other Topics Concern  . Not on file   Social History Narrative   No exercise      History  Smoking Status  . Never Smoker  Smokeless Tobacco  . Never Used   History  Alcohol Use No    Family History:  Family History  Problem Relation Age of Onset  . Heart disease Mother   . Heart attack Father   . Hypertension Father   . Cancer Paternal Aunt   . Breast cancer Paternal Aunt     Past medical history, surgical history, medications, allergies, family history and social history reviewed with patient today and changes made to appropriate areas of the chart.   Review of Systems  Constitutional: Negative.   HENT: Positive for hearing loss. Negative for congestion, ear discharge, ear pain, nosebleeds, sinus pain, sore throat and tinnitus.   Eyes: Negative.   Respiratory: Negative.  Negative for stridor.   Cardiovascular: Negative.   Gastrointestinal: Positive for diarrhea and heartburn (with diet). Negative for abdominal pain, blood in stool, constipation, melena, nausea and vomiting.  Genitourinary: Negative.   Musculoskeletal: Negative.   Skin: Positive for itching and rash.  Neurological: Negative.   Endo/Heme/Allergies: Positive for polydipsia. Negative for environmental allergies. Does not bruise/bleed easily.  Psychiatric/Behavioral: Positive for depression. Negative for hallucinations, memory loss, substance abuse and suicidal ideas. The patient is nervous/anxious. The patient does not have insomnia.     All other ROS negative except what is listed above and in the HPI.      Objective:    BP 126/84 (BP Location: Left Arm, Patient Position: Sitting, Cuff Size: Large)    Pulse 76   Temp 98 F (36.7 C)   Ht 5\' 3"  (1.6 m)   Wt 196 lb 6.4 oz (89.1 kg)   SpO2 97%   BMI 34.79 kg/m   Wt Readings from Last 3 Encounters:  01/07/16 196 lb 6.4 oz (89.1 kg)  11/11/15 196 lb 4.8 oz (89 kg)  10/15/15 192 lb 3.2 oz (87.2 kg)     Visual Acuity Screening   Right eye Left eye Both eyes  Without correction: 20/40 20/40 20/30   With correction:      Physical Exam  Constitutional: She is oriented to person, place, and time. She appears well-developed and well-nourished. No distress.  HENT:  Head: Normocephalic and atraumatic.  Right Ear: Hearing, tympanic membrane, external ear and ear canal normal.  Left Ear: Hearing, tympanic membrane, external ear and ear canal normal.  Nose: Nose normal.  Mouth/Throat: Uvula is midline, oropharynx is clear and moist and mucous membranes are normal. No oropharyngeal exudate.  Eyes: Conjunctivae, EOM and lids are normal.  Pupils are equal, round, and reactive to light. Right eye exhibits no discharge. Left eye exhibits no discharge. No scleral icterus.  Neck: Normal range of motion. Neck supple. No JVD present. No tracheal deviation present. No thyromegaly present.  Cardiovascular: Normal rate, regular rhythm, normal heart sounds and intact distal pulses.  Exam reveals no gallop and no friction rub.   No murmur heard. Pulmonary/Chest: Effort normal and breath sounds normal. No stridor. No respiratory distress. She has no wheezes. She has no rales. She exhibits no tenderness.  Abdominal: Soft. Bowel sounds are normal. She exhibits no distension and no mass. There is no tenderness. There is no rebound and no guarding.  Musculoskeletal: Normal range of motion. She exhibits no edema, tenderness or deformity.  Lymphadenopathy:    She has no cervical adenopathy.  Neurological: She is alert and oriented to person, place, and time. She has normal reflexes. She displays normal reflexes. No cranial nerve deficit. She exhibits normal muscle  tone. Coordination normal.  Skin: Skin is warm, dry and intact. No rash noted. She is not diaphoretic. No erythema.  Psychiatric: She has a normal mood and affect. Her speech is normal and behavior is normal. Judgment and thought content normal. Cognition and memory are normal.  Nursing note and vitals reviewed.   6CIT Screen 01/07/2016  What Year? 0 points  What month? 0 points  What time? 0 points  Count back from 20 0 points  Months in reverse 0 points  Repeat phrase 2 points  Total Score 2     Results for orders placed or performed in visit on 11/11/15  Comprehensive metabolic panel  Result Value Ref Range   Glucose 104 (H) 65 - 99 mg/dL   BUN 16 8 - 27 mg/dL   Creatinine, Ser 1.610.80 0.57 - 1.00 mg/dL   GFR calc non Af Amer 77 >59 mL/min/1.73   GFR calc Af Amer 89 >59 mL/min/1.73   BUN/Creatinine Ratio 20 12 - 28   Sodium 143 134 - 144 mmol/L   Potassium 4.1 3.5 - 5.2 mmol/L   Chloride 103 96 - 106 mmol/L   CO2 26 18 - 29 mmol/L   Calcium 8.7 8.7 - 10.3 mg/dL   Total Protein 6.4 6.0 - 8.5 g/dL   Albumin 4.3 3.6 - 4.8 g/dL   Globulin, Total 2.1 1.5 - 4.5 g/dL   Albumin/Globulin Ratio 2.0 1.2 - 2.2   Bilirubin Total 0.3 0.0 - 1.2 mg/dL   Alkaline Phosphatase 59 39 - 117 IU/L   AST 20 0 - 40 IU/L   ALT 19 0 - 32 IU/L      Assessment & Plan:   Problem List Items Addressed This Visit      Cardiovascular and Mediastinum   Hypertension    Under good control today. Continue current regimen. Continue to monitor. Call with any concerns.       Relevant Medications   hydrochlorothiazide (HYDRODIURIL) 25 MG tablet   Other Relevant Orders   CBC with Differential/Platelet   Comprehensive metabolic panel   Microalbumin, Urine Waived   TSH   UA/M w/rflx Culture, Routine     Respiratory   Allergic rhinitis    Stable. Continue current regimen. Continue to monitor. Call with any concerns.         Endocrine   IFG (impaired fasting glucose)    A1c stable at 5.6. Continue  to watch diet and exercise. Recheck 6 months.       Relevant Orders   CBC  with Differential/Platelet   Bayer DCA Hb A1c Waived   Comprehensive metabolic panel   Microalbumin, Urine Waived   UA/M w/rflx Culture, Routine     Nervous and Auditory   Hearing loss in left ear    Did not wear her hearing aids today. Continue to follow with audiology.        Musculoskeletal and Integument   Osteoporosis    Has been on bisphosphonate for 11 months. Continue for 5 years total.         Genitourinary   Overactive bladder    Stable. Continue current regimen. Continue to monitor.       Vaginal atrophy    Stable. Continue to monitor.         Other   Depression    Not doing great. Will increase effexor to 150mg  and check back in 1 month. Call with any concerns.       Relevant Medications   venlafaxine XR (EFFEXOR-XR) 150 MG 24 hr capsule   buPROPion (WELLBUTRIN XL) 300 MG 24 hr tablet   Other Relevant Orders   CBC with Differential/Platelet   Comprehensive metabolic panel   TSH   Hyperlipidemia    Stable on current regimen. Continue current regimen. Refill given. Recheck 6 months.       Relevant Medications   hydrochlorothiazide (HYDRODIURIL) 25 MG tablet   Other Relevant Orders   CBC with Differential/Platelet   Comprehensive metabolic panel   Lipid Panel Piccolo, Waived   Vitamin D deficiency    Rechecking levels today. Await results.       Relevant Orders   Comprehensive metabolic panel   VITAMIN D 25 Hydroxy (Vit-D Deficiency, Fractures)   Memory loss    6CIT normal today. Continue to monitor.        Other Visit Diagnoses    Medicare annual wellness visit, subsequent    -  Primary   Preventative care discussed as below. Advance Directives discussed. Vaccines updated. Continue diet and exercise.    Immunization due       Relevant Orders   Pneumococcal polysaccharide vaccine 23-valent greater than or equal to 2yo subcutaneous/IM (Completed)   Flu vaccine HIGH  DOSE PF (Fluzone High dose) (Completed)       Preventative Services:  Health Risk Assessment and Personalized Prevention Plan: Done today Bone Mass Measurements: Done this year Breast Cancer Screening: Due in January CVD Screening: Done today Cervical Cancer Screening: N/A Colon Cancer Screening: Up to date Depression Screening: Done today Diabetes Screening: Done today Glaucoma Screening: See your eye doctor Hepatitis B vaccine: N/A Hepatitis C screening: up to date HIV Screening: Up to date Flu Vaccine: Given today Lung cancer Screening: N/A Obesity Screening: Done today Pneumonia Vaccines (2): 2nd shot given today STI Screening: N/A  Follow up plan: Return in about 4 weeks (around 02/04/2016) for Follow up mood.   LABORATORY TESTING:  - Pap smear: not applicable  IMMUNIZATIONS:   - Tdap: Tetanus vaccination status reviewed: last tetanus booster within 10 years. - Influenza: Administered today - Pneumovax: Administered today - Prevnar: Up to date - Zostavax vaccine: Up to date  SCREENING: -Mammogram: Ordered today  - Colonoscopy: Up to date  - Bone Density: Up to date  -Hearing Test: Up to date  -Spirometry: Not applicable   PATIENT COUNSELING:   Advised to take 1 mg of folate supplement per day if capable of pregnancy.   Sexuality: Discussed sexually transmitted diseases, partner selection, use of condoms, avoidance of unintended pregnancy  and contraceptive alternatives.   Advised to avoid cigarette smoking.  I discussed with the patient that most people either abstain from alcohol or drink within safe limits (<=14/week and <=4 drinks/occasion for males, <=7/weeks and <= 3 drinks/occasion for females) and that the risk for alcohol disorders and other health effects rises proportionally with the number of drinks per week and how often a drinker exceeds daily limits.  Discussed cessation/primary prevention of drug use and availability of treatment for abuse.    Diet: Encouraged to adjust caloric intake to maintain  or achieve ideal body weight, to reduce intake of dietary saturated fat and total fat, to limit sodium intake by avoiding high sodium foods and not adding table salt, and to maintain adequate dietary potassium and calcium preferably from fresh fruits, vegetables, and low-fat dairy products.    stressed the importance of regular exercise  Injury prevention: Discussed safety belts, safety helmets, smoke detector, smoking near bedding or upholstery.   Dental health: Discussed importance of regular tooth brushing, flossing, and dental visits.    NEXT PREVENTATIVE PHYSICAL DUE IN 1 YEAR. Return in about 4 weeks (around 02/04/2016) for Follow up mood.

## 2016-01-07 NOTE — Assessment & Plan Note (Signed)
Stable.       - Continue to monitor

## 2016-01-07 NOTE — Assessment & Plan Note (Signed)
Under good control today. Continue current regimen. Continue to monitor. Call with any concerns.  

## 2016-01-07 NOTE — Assessment & Plan Note (Signed)
Rechecking levels today. Await results.  

## 2016-01-07 NOTE — Patient Instructions (Addendum)
TAKE 2 VENLAFAXINE UNTIL YOU RUN OUT- then OK to take the '150mg'$  at [employer]:  Health Risk Assessment and Personalized Prevention Plan: Done today Bone Mass Measurements: Done this year Breast Cancer Screening: Due in January CVD Screening: Done today Cervical Cancer Screening: N/A Colon Cancer Screening: Up to date Depression Screening: Done today Diabetes Screening: Done today Glaucoma Screening: See your eye doctor Hepatitis B vaccine: N/A Hepatitis C screening: up to date HIV Screening: Up to date Flu Vaccine: Given today Lung cancer Screening: N/A Obesity Screening: Done today Pneumonia Vaccines (2): 2nd shot given today STI Screening: N/A  Health Maintenance, Female Introduction Adopting a healthy lifestyle and getting preventive care can go a long way to promote health and wellness. Talk with your health care provider about what schedule of regular examinations is right for you. This is a good chance for you to check in with your provider about disease prevention and staying healthy. In between checkups, there are plenty of things you can do on your own. Experts have done a lot of research about which lifestyle changes and preventive measures are most likely to keep you healthy. Ask your health care provider for more information. Weight and diet Eat a healthy diet  Be sure to include plenty of vegetables, fruits, low-fat dairy products, and lean protein.  Do not eat a lot of foods high in solid fats, added sugars, or salt.  Get regular exercise. This is one of the most important things you can do for your health.  Most adults should exercise for at least 150 minutes each week. The exercise should increase your heart rate and make you sweat (moderate-intensity exercise).  Most adults should also do strengthening exercises at least twice a week. This is in addition to the moderate-intensity exercise. Maintain a healthy weight  Body mass index (BMI)  is a measurement that can be used to identify possible weight problems. It estimates body fat based on height and weight. Your health care provider can help determine your BMI and help you achieve or maintain a healthy weight.  For females 66 years of age and older:  A BMI below 18.5 is considered underweight.  A BMI of 18.5 to 24.9 is normal.  A BMI of 25 to 29.9 is considered overweight.  A BMI of 30 and above is considered obese. Watch levels of cholesterol and blood lipids  You should start having your blood tested for lipids and cholesterol at 66 years of age, then have this test every 5 years.  You may need to have your cholesterol levels checked more often if:  Your lipid or cholesterol levels are high.  You are older than 66 years of age.  You are at high risk for heart disease. Cancer screening Lung Cancer  Lung cancer screening is recommended for adults 66-28 years old who are at high risk for lung cancer because of a history of smoking.  A yearly low-dose CT scan of the lungs is recommended for people who:  Currently smoke.  Have quit within the past 15 years.  Have at least a 30-pack-year history of smoking. A pack year is smoking an average of one pack of cigarettes a day for 1 year.  Yearly screening should continue until it has been 15 years since you quit.  Yearly screening should stop if you develop a health problem that would prevent you from having lung cancer treatment. Breast Cancer  Practice breast self-awareness. This means understanding how your breasts  normally appear and feel.  It also means doing regular breast self-exams. Let your health care provider know about any changes, no matter how small.  If you are in your 20s or 30s, you should have a clinical breast exam (CBE) by a health care provider every 1-3 years as part of a regular health exam.  If you are 66 or older, have a CBE every year. Also consider having a breast X-ray (mammogram)  every year.  If you have a family history of breast cancer, talk to your health care provider about genetic screening.  If you are at high risk for breast cancer, talk to your health care provider about having an MRI and a mammogram every year.  Breast cancer gene (BRCA) assessment is recommended for women who have family members with BRCA-related cancers. BRCA-related cancers include:  Breast.  Ovarian.  Tubal.  Peritoneal cancers.  Results of the assessment will determine the need for genetic counseling and BRCA1 and BRCA2 testing. Cervical Cancer  Your health care provider may recommend that you be screened regularly for cancer of the pelvic organs (ovaries, uterus, and vagina). This screening involves a pelvic examination, including checking for microscopic changes to the surface of your cervix (Pap test). You may be encouraged to have this screening done every 3 years, beginning at age 30.  For women ages 41-65, health care providers may recommend pelvic exams and Pap testing every 3 years, or they may recommend the Pap and pelvic exam, combined with testing for human papilloma virus (HPV), every 5 years. Some types of HPV increase your risk of cervical cancer. Testing for HPV may also be done on women of any age with unclear Pap test results.  Other health care providers may not recommend any screening for nonpregnant women who are considered low risk for pelvic cancer and who do not have symptoms. Ask your health care provider if a screening pelvic exam is right for you.  If you have had past treatment for cervical cancer or a condition that could lead to cancer, you need Pap tests and screening for cancer for at least 20 years after your treatment. If Pap tests have been discontinued, your risk factors (such as having a new sexual partner) need to be reassessed to determine if screening should resume. Some women have medical problems that increase the chance of getting cervical  cancer. In these cases, your health care provider may recommend more frequent screening and Pap tests. Colorectal Cancer  This type of cancer can be detected and often prevented.  Routine colorectal cancer screening usually begins at 66 years of age and continues through 66 years of age.  Your health care provider may recommend screening at an earlier age if you have risk factors for colon cancer.  Your health care provider may also recommend using home test kits to check for hidden blood in the stool.  A small camera at the end of a tube can be used to examine your colon directly (sigmoidoscopy or colonoscopy). This is done to check for the earliest forms of colorectal cancer.  Routine screening usually begins at age 46.  Direct examination of the colon should be repeated every 5-10 years through 66 years of age. However, you may need to be screened more often if early forms of precancerous polyps or small growths are found. Skin Cancer  Check your skin from head to toe regularly.  Tell your health care provider about any new moles or changes in moles, especially if  there is a change in a mole's shape or color.  Also tell your health care provider if you have a mole that is larger than the size of a pencil eraser.  Always use sunscreen. Apply sunscreen liberally and repeatedly throughout the day.  Protect yourself by wearing long sleeves, pants, a wide-brimmed hat, and sunglasses whenever you are outside. Heart disease, diabetes, and high blood pressure  High blood pressure causes heart disease and increases the risk of stroke. High blood pressure is more likely to develop in:  People who have blood pressure in the high end of the normal range (130-139/85-89 mm Hg).  People who are overweight or obese.  People who are African American.  If you are 61-50 years of age, have your blood pressure checked every 3-5 years. If you are 23 years of age or older, have your blood pressure  checked every year. You should have your blood pressure measured twice-once when you are at a hospital or clinic, and once when you are not at a hospital or clinic. Record the average of the two measurements. To check your blood pressure when you are not at a hospital or clinic, you can use:  An automated blood pressure machine at a pharmacy.  A home blood pressure monitor.  If you are between 34 years and 23 years old, ask your health care provider if you should take aspirin to prevent strokes.  Have regular diabetes screenings. This involves taking a blood sample to check your fasting blood sugar level.  If you are at a normal weight and have a low risk for diabetes, have this test once every three years after 66 years of age.  If you are overweight and have a high risk for diabetes, consider being tested at a younger age or more often. Preventing infection Hepatitis B  If you have a higher risk for hepatitis B, you should be screened for this virus. You are considered at high risk for hepatitis B if:  You were born in a country where hepatitis B is common. Ask your health care provider which countries are considered high risk.  Your parents were born in a high-risk country, and you have not been immunized against hepatitis B (hepatitis B vaccine).  You have HIV or AIDS.  You use needles to inject street drugs.  You live with someone who has hepatitis B.  You have had sex with someone who has hepatitis B.  You get hemodialysis treatment.  You take certain medicines for conditions, including cancer, organ transplantation, and autoimmune conditions. Hepatitis C  Blood testing is recommended for:  Everyone born from [birth year] through 1966.  Anyone with known risk factors for hepatitis C. Sexually transmitted infections (STIs)  You should be screened for sexually transmitted infections (STIs) including gonorrhea and chlamydia if:  You are sexually active and are younger than 66  years of age.  You are older than 66 years of age and your health care provider tells you that you are at risk for this type of infection.  Your sexual activity has changed since you were last screened and you are at an increased risk for chlamydia or gonorrhea. Ask your health care provider if you are at risk.  If you do not have HIV, but are at risk, it may be recommended that you take a prescription medicine daily to prevent HIV infection. This is called pre-exposure prophylaxis (PrEP). You are considered at risk if:  You are sexually active and do not regularly  use condoms or know the HIV status of your partner(s).  You take drugs by injection.  You are sexually active with a partner who has HIV. Talk with your health care provider about whether you are at high risk of being infected with HIV. If you choose to begin PrEP, you should first be tested for HIV. You should then be tested every 3 months for as long as you are taking PrEP. Pregnancy  If you are premenopausal and you may become pregnant, ask your health care provider about preconception counseling.  If you may become pregnant, take 400 to 800 micrograms (mcg) of folic acid every day.  If you want to prevent pregnancy, talk to your health care provider about birth control (contraception). Osteoporosis and menopause  Osteoporosis is a disease in which the bones lose minerals and strength with aging. This can result in serious bone fractures. Your risk for osteoporosis can be identified using a bone density scan.  If you are 37 years of age or older, or if you are at risk for osteoporosis and fractures, ask your health care provider if you should be screened.  Ask your health care provider whether you should take a calcium or vitamin D supplement to lower your risk for osteoporosis.  Menopause may have certain physical symptoms and risks.  Hormone replacement therapy may reduce some of these symptoms and risks. Talk to your  health care provider about whether hormone replacement therapy is right for you. Follow these instructions at home:  Schedule regular health, dental, and eye exams.  Stay current with your immunizations.  Do not use any tobacco products including cigarettes, chewing tobacco, or electronic cigarettes.  If you are pregnant, do not drink alcohol.  If you are breastfeeding, limit how much and how often you drink alcohol.  Limit alcohol intake to no more than 1 drink per day for nonpregnant women. One drink equals 12 ounces of beer, 5 ounces of wine, or 1 ounces of hard liquor.  Do not use street drugs.  Do not share needles.  Ask your health care provider for help if you need support or information about quitting drugs.  Tell your health care provider if you often feel depressed.  Tell your health care provider if you have ever been abused or do not feel safe at home. This information is not intended to replace advice given to you by your health care provider. Make sure you discuss any questions you have with your health care provider. Document Released: 08/03/2010 Document Revised: 06/26/2015 Document Reviewed: 10/22/2014  2017 Elsevier Influenza (Flu) Vaccine (Inactivated or Recombinant): What You Need to Know 1. Why get vaccinated? Influenza ("flu") is a contagious disease that spreads around the [place] every year, usually between October and May. Flu is caused by influenza viruses, and is spread mainly by coughing, sneezing, and close contact. Anyone can get flu. Flu strikes suddenly and can last several days. Symptoms vary by age, but can include:  fever/chills  sore throat  muscle aches  fatigue  cough  headache  runny or stuffy nose Flu can also lead to pneumonia and blood infections, and cause diarrhea and seizures in children. If you have a medical condition, such as heart or lung disease, flu can make it worse. Flu is more dangerous for some people.  Infants and young children, people 68 years of age and older, pregnant women, and people with certain health conditions or a weakened immune system are at greatest risk. Each year thousands  of people in the [place] die from flu, and many more are hospitalized. Flu vaccine can:  keep you from getting flu,  make flu less severe if you do get it, and  keep you from spreading flu to your family and other people. 2. Inactivated and recombinant flu vaccines A dose of flu vaccine is recommended every flu season. Children 6 months through 64 years of age may need two doses during the same flu season. Everyone else needs only one dose each flu season. Some inactivated flu vaccines contain a very small amount of a mercury-based preservative called thimerosal. Studies have not shown thimerosal in vaccines to be harmful, but flu vaccines that do not contain thimerosal are available. There is no live flu virus in flu shots. They cannot cause the flu. There are many flu viruses, and they are always changing. Each year a new flu vaccine is made to protect against three or four viruses that are likely to cause disease in the upcoming flu season. But even when the vaccine doesn't exactly match these viruses, it may still provide some protection. Flu vaccine cannot prevent:  flu that is caused by a virus not covered by the vaccine, or  illnesses that look like flu but are not. It takes about 2 weeks for protection to develop after vaccination, and protection lasts through the flu season. 3. Some people should not get this vaccine Tell the person who is giving you the vaccine:  If you have any severe, life-threatening allergies. If you ever had a life-threatening allergic reaction after a dose of flu vaccine, or have a severe allergy to any part of this vaccine, you may be advised not to get vaccinated. Most, but not all, types of flu vaccine contain a small amount of egg protein.  If you ever had  Guillain-Barr Syndrome (also called GBS). Some people with a history of GBS should not get this vaccine. This should be discussed with your doctor.  If you are not feeling well. It is usually okay to get flu vaccine when you have a mild illness, but you might be asked to come back when you feel better. 4. Risks of a vaccine reaction With any medicine, including vaccines, there is a chance of reactions. These are usually mild and go away on their own, but serious reactions are also possible. Most people who get a flu shot do not have any problems with it. Minor problems following a flu shot include:  soreness, redness, or swelling where the shot was given  hoarseness  sore, red or itchy eyes  cough  fever  aches  headache  itching  fatigue If these problems occur, they usually begin soon after the shot and last 1 or 2 days. More serious problems following a flu shot can include the following:  There may be a small increased risk of Guillain-Barre Syndrome (GBS) after inactivated flu vaccine. This risk has been estimated at 1 or 2 additional cases per million people vaccinated. This is much lower than the risk of severe complications from flu, which can be prevented by flu vaccine.  Young children who get the flu shot along with pneumococcal vaccine (PCV13) and/or DTaP vaccine at the same time might be slightly more likely to have a seizure caused by fever. Ask your doctor for more information. Tell your doctor if a child who is getting flu vaccine has ever had a seizure. Problems that could happen after any injected vaccine:  People sometimes faint after a  medical procedure, including vaccination. Sitting or lying down for about 15 minutes can help prevent fainting, and injuries caused by a fall. Tell your doctor if you feel dizzy, or have vision changes or ringing in the ears.  Some people get severe pain in the shoulder and have difficulty moving the arm where a shot was given.  This happens very rarely.  Any medication can cause a severe allergic reaction. Such reactions from a vaccine are very rare, estimated at about 1 in a million doses, and would happen within a few minutes to a few hours after the vaccination. As with any medicine, there is a very remote chance of a vaccine causing a serious injury or death. The safety of vaccines is always being monitored. For more information, visit: http://www.aguilar.org/ 5. What if there is a serious reaction? What should I look for? Look for anything that concerns you, such as signs of a severe allergic reaction, very high fever, or unusual behavior. Signs of a severe allergic reaction can include hives, swelling of the face and throat, difficulty breathing, a fast heartbeat, dizziness, and weakness. These would start a few minutes to a few hours after the vaccination. What should I do?  If you think it is a severe allergic reaction or other emergency that can't wait, call 9-1-1 and get the person to the nearest hospital. Otherwise, call your doctor.  Reactions should be reported to the Vaccine Adverse Event Reporting System (VAERS). Your doctor should file this report, or you can do it yourself through the VAERS web site at www.vaers.SamedayNews.es, or by calling 973-441-7153.  VAERS does not give medical advice. 6. The National Vaccine Injury Compensation Program The [employer] Vaccine Injury Compensation Program (VICP) is a federal program that was created to compensate people who may have been injured by certain vaccines. Persons who believe they may have been injured by a vaccine can learn about the program and about filing a claim by calling 317-094-5956 or visiting the [employer] website at GoldCloset.com.ee. There is a time limit to file a claim for compensation. 7. How can I learn more?  Ask your healthcare provider. He or she can give you the vaccine package insert or suggest other sources of  information.  Call your local or state health department.  Contact the Centers for Disease Control and Prevention (CDC):  Call 435-348-7080 (1-800-CDC-INFO) or  Visit CDC's website at https://gibson.com/ Vaccine Information Statement, Inactivated Influenza Vaccine (09/07/2013) This information is not intended to replace advice given to you by your health care provider. Make sure you discuss any questions you have with your health care provider. Document Released: 11/12/2005 Document Revised: 10/09/2015 Document Reviewed: 10/09/2015 Elsevier Interactive Patient Education  2017 [place]. Pneumococcal Polysaccharide Vaccine: What You Need to Know 1. Why get vaccinated? Vaccination can protect older adults (and some children and younger adults) from pneumococcal disease. Pneumococcal disease is caused by bacteria that can spread from person to person through close contact. It can cause ear infections, and it can also lead to more serious infections of the:  Lungs (pneumonia),  Blood (bacteremia), and  Covering of the brain and spinal cord (meningitis). Meningitis can cause deafness and brain damage, and it can be fatal. Anyone can get pneumococcal disease, but children under 72 years of age, people with certain medical conditions, adults over 71 years of age, and cigarette smokers are at the highest risk. About 18,000 older adults die each year from pneumococcal disease in the [place]. Treatment of pneumococcal infections with  penicillin and other drugs used to be more effective. But some strains of the disease have become resistant to these drugs. This makes prevention of the disease, through vaccination, even more important. 2. Pneumococcal polysaccharide vaccine (PPSV23) Pneumococcal polysaccharide vaccine (PPSV23) protects against 23 types of pneumococcal bacteria. It will not prevent all pneumococcal disease. PPSV23 is recommended for:  All adults 63 years of age and  older,  Anyone 2 through 66 years of age with certain long-term health problems,  Anyone 2 through 66 years of age with a weakened immune system,  Adults 19 through 66 years of age who smoke cigarettes or have asthma. Most people need only one dose of PPSV. A second dose is recommended for certain high-risk groups. People 71 and older should get a dose even if they have gotten one or more doses of the vaccine before they turned 65. Your healthcare provider can give you more information about these recommendations. Most healthy adults develop protection within 2 to 3 weeks of getting the shot. 3. Some people should not get this vaccine  Anyone who has had a life-threatening allergic reaction to PPSV should not get another dose.  Anyone who has a severe allergy to any component of PPSV should not receive it. Tell your provider if you have any severe allergies.  Anyone who is moderately or severely ill when the shot is scheduled may be asked to wait until they recover before getting the vaccine. Someone with a mild illness can usually be vaccinated.  Children less than 35 years of age should not receive this vaccine.  There is no evidence that PPSV is harmful to either a pregnant woman or to her fetus. However, as a precaution, women who need the vaccine should be vaccinated before becoming pregnant, if possible. 4. Risks of a vaccine reaction With any medicine, including vaccines, there is a chance of side effects. These are usually mild and go away on their own, but serious reactions are also possible. About half of people who get PPSV have mild side effects, such as redness or pain where the shot is given, which go away within about two days. Less than 1 out of 100 people develop a fever, muscle aches, or more severe local reactions. Problems that could happen after any vaccine:  People sometimes faint after a medical procedure, including vaccination. Sitting or lying down for about 15  minutes can help prevent fainting, and injuries caused by a fall. Tell your doctor if you feel dizzy, or have vision changes or ringing in the ears.  Some people get severe pain in the shoulder and have difficulty moving the arm where a shot was given. This happens very rarely.  Any medication can cause a severe allergic reaction. Such reactions from a vaccine are very rare, estimated at about 1 in a million doses, and would happen within a few minutes to a few hours after the vaccination. As with any medicine, there is a very remote chance of a vaccine causing a serious injury or death. The safety of vaccines is always being monitored. For more information, visit: http://www.aguilar.org/ 5. What if there is a serious reaction? What should I look for? Look for anything that concerns you, such as signs of a severe allergic reaction, very high fever, or unusual behavior. Signs of a severe allergic reaction can include hives, swelling of the face and throat, difficulty breathing, a fast heartbeat, dizziness, and weakness. These would usually start a few minutes to a few hours after  the vaccination. What should I do? If you think it is a severe allergic reaction or other emergency that can't wait, call 9-1-1 or get to the nearest hospital. Otherwise, call your doctor. Afterward, the reaction should be reported to the Vaccine Adverse Event Reporting System (VAERS). Your doctor might file this report, or you can do it yourself through the VAERS web site at www.vaers.SamedayNews.es, or by calling (248)064-9733. VAERS does not give medical advice. 6. How can I learn more?  Ask your doctor. He or she can give you the vaccine package insert or suggest other sources of information.  Call your local or state health department.  Contact the Centers for Disease Control and Prevention (CDC):  Call 209-146-5984 (1-800-CDC-INFO) or  Visit CDC's website at http://hunter.com/ CDC Pneumococcal Polysaccharide  Vaccine VIS (05/25/13) This information is not intended to replace advice given to you by your health care provider. Make sure you discuss any questions you have with your health care provider. Document Released: 11/15/2005 Document Revised: 10/09/2015 Document Reviewed: 10/09/2015 Elsevier Interactive Patient Education  2017 [employer].

## 2016-01-08 ENCOUNTER — Encounter: Payer: Self-pay | Admitting: Family Medicine

## 2016-01-08 LAB — COMPREHENSIVE METABOLIC PANEL
ALK PHOS: 56 IU/L (ref 39–117)
ALT: 19 IU/L (ref 0–32)
AST: 21 IU/L (ref 0–40)
Albumin/Globulin Ratio: 2.1 (ref 1.2–2.2)
Albumin: 4.2 g/dL (ref 3.6–4.8)
BUN/Creatinine Ratio: 29 — ABNORMAL HIGH (ref 12–28)
BUN: 23 mg/dL (ref 8–27)
Bilirubin Total: 0.3 mg/dL (ref 0.0–1.2)
CALCIUM: 8.5 mg/dL — AB (ref 8.7–10.3)
CO2: 23 mmol/L (ref 18–29)
CREATININE: 0.78 mg/dL (ref 0.57–1.00)
Chloride: 103 mmol/L (ref 96–106)
GFR calc Af Amer: 92 mL/min/{1.73_m2} (ref >59)
GFR, EST NON AFRICAN AMERICAN: 79 mL/min/{1.73_m2} (ref >59)
GLUCOSE: 102 mg/dL — AB (ref 65–99)
Globulin, Total: 2 g/dL (ref 1.5–4.5)
Potassium: 4 mmol/L (ref 3.5–5.2)
SODIUM: 145 mmol/L — AB (ref 134–144)
Total Protein: 6.2 g/dL (ref 6.0–8.5)

## 2016-01-08 LAB — VITAMIN D 25 HYDROXY (VIT D DEFICIENCY, FRACTURES): VIT D 25 HYDROXY: 18.9 ng/mL — AB (ref 30.0–100.0)

## 2016-01-08 LAB — CBC WITH DIFFERENTIAL/PLATELET
BASOS ABS: 0 10*3/uL (ref 0.0–0.2)
Basos: 1 %
EOS (ABSOLUTE): 0.2 10*3/uL (ref 0.0–0.4)
EOS: 4 %
Hematocrit: 42.4 % (ref 34.0–46.6)
Hemoglobin: 13.9 g/dL (ref 11.1–15.9)
IMMATURE GRANULOCYTES: 0 %
Immature Grans (Abs): 0 10*3/uL (ref 0.0–0.1)
LYMPHS ABS: 1.6 10*3/uL (ref 0.7–3.1)
Lymphs: 26 %
MCH: 28.8 pg (ref 26.6–33.0)
MCHC: 32.8 g/dL (ref 31.5–35.7)
MCV: 88 fL (ref 79–97)
MONOS ABS: 0.5 10*3/uL (ref 0.1–0.9)
Monocytes: 8 %
NEUTROS PCT: 61 %
Neutrophils Absolute: 3.7 10*3/uL (ref 1.4–7.0)
PLATELETS: 248 10*3/uL (ref 150–379)
RBC: 4.82 x10E6/uL (ref 3.77–5.28)
RDW: 13.2 % (ref 12.3–15.4)
WBC: 6.1 10*3/uL (ref 3.4–10.8)

## 2016-01-08 LAB — TSH: TSH: 1.01 u[IU]/mL (ref 0.450–4.500)

## 2016-01-09 ENCOUNTER — Telehealth: Payer: Self-pay | Admitting: Family Medicine

## 2016-01-09 MED ORDER — NITROFURANTOIN MONOHYD MACRO 100 MG PO CAPS: 100.0000 mg | ORAL_CAPSULE | Freq: Two times a day (BID) | ORAL | 0 refills | Status: DC

## 2016-01-09 NOTE — Telephone Encounter (Signed)
Please let Merelin know that her urine grew out bacteria, so I sent an antibiotic over to her pharmacy. Thanks!

## 2016-01-12 LAB — UA/M W/RFLX CULTURE, ROUTINE
BILIRUBIN UA: NEGATIVE
Glucose, UA: NEGATIVE
KETONES UA: NEGATIVE
Leukocytes, UA: NEGATIVE
NITRITE UA: NEGATIVE
SPEC GRAV UA: 1.025 (ref 1.005–1.030)
Urobilinogen, Ur: 0.2 mg/dL (ref 0.2–1.0)
pH, UA: 5.5 (ref 5.0–7.5)

## 2016-01-12 LAB — MICROALBUMIN, URINE WAIVED
Creatinine, Urine Waived: 300 mg/dL (ref 10–300)
Microalb, Ur Waived: 30 mg/L — ABNORMAL HIGH (ref 0–19)
Microalb/Creat Ratio: <30 mg/g (ref <30)

## 2016-01-12 LAB — LIPID PANEL PICCOLO, WAIVED
Chol/HDL Ratio Piccolo,Waive: 4.6 mg/dL
Cholesterol Piccolo, Waived: 203 mg/dL — ABNORMAL HIGH (ref <200)
HDL CHOL PICCOLO, WAIVED: 44 mg/dL — AB (ref >59)
LDL Chol Calc Piccolo Waived: 120 mg/dL — ABNORMAL HIGH (ref <100)
TRIGLYCERIDES PICCOLO,WAIVED: 190 mg/dL — AB (ref <150)
VLDL CHOL CALC PICCOLO,WAIVE: 38 mg/dL — AB (ref <30)

## 2016-01-12 LAB — MICROSCOPIC EXAMINATION

## 2016-01-12 LAB — URINE CULTURE, REFLEX

## 2016-01-12 LAB — BAYER DCA HB A1C WAIVED: HB A1C (BAYER DCA - WAIVED): 5.6 % (ref <7.0)

## 2016-01-13 ENCOUNTER — Other Ambulatory Visit: Payer: Self-pay | Admitting: Family Medicine

## 2016-01-20 ENCOUNTER — Telehealth: Payer: Self-pay | Admitting: Family Medicine

## 2016-01-20 NOTE — Telephone Encounter (Signed)
Tried calling patient's home number but a man answered and said the patient was not in. Tried calling mobile number and left the patient a VM. I told her I would try to call again tomorrow.

## 2016-01-20 NOTE — Telephone Encounter (Signed)
Call pt 

## 2016-01-20 NOTE — Telephone Encounter (Signed)
Pt called stated she saw Dr. Laural BenesJohnson last week and she is still having issues with her kidneys. Would like to know if another round of medication can be sent to the pharmacy for her. [facility]. Thanks.

## 2016-01-21 NOTE — Telephone Encounter (Signed)
Called and left patient a VM on home and mobile numbers asking for her to please return our call.

## 2016-01-22 ENCOUNTER — Telehealth: Payer: Self-pay | Admitting: Family Medicine

## 2016-01-22 NOTE — Telephone Encounter (Signed)
Patient came by to let Dr Dossie Arbourrissman know that she is feeling better and it is not necessary for him to try to reach her.  Thank You  Clydie BraunKaren

## 2016-01-22 NOTE — Telephone Encounter (Signed)
Routing to provider and [place] as I see they've been trying to get in touch with patient.

## 2016-02-03 ENCOUNTER — Encounter: Payer: Self-pay | Admitting: Family Medicine

## 2016-02-03 ENCOUNTER — Ambulatory Visit (INDEPENDENT_AMBULATORY_CARE_PROVIDER_SITE_OTHER): Payer: Medicare Other | Admitting: Family Medicine

## 2016-02-03 VITALS — BP 126/81 | HR 88 | Temp 97.9°F | Wt 197.2 lb

## 2016-02-03 DIAGNOSIS — F324 Major depressive disorder, single episode, in partial remission: Secondary | ICD-10-CM

## 2016-02-03 DIAGNOSIS — J209 Acute bronchitis, unspecified: Secondary | ICD-10-CM | POA: Diagnosis not present

## 2016-02-03 MED ORDER — AZITHROMYCIN 250 MG PO TABS: ORAL_TABLET | ORAL | 0 refills | Status: DC

## 2016-02-03 MED ORDER — ALBUTEROL SULFATE HFA 108 (90 BASE) MCG/ACT IN AERS: 2.0000 | INHALATION_SPRAY | Freq: Four times a day (QID) | RESPIRATORY_TRACT | 0 refills | Status: DC | PRN

## 2016-02-03 MED ORDER — BENZONATATE 200 MG PO CAPS: 200.0000 mg | ORAL_CAPSULE | Freq: Two times a day (BID) | ORAL | 0 refills | Status: DC | PRN

## 2016-02-03 NOTE — Assessment & Plan Note (Signed)
Doing much better. Continue current regimen. Recheck 6 months. Call with any concerns.

## 2016-02-03 NOTE — Progress Notes (Signed)
BP 126/81 (BP Location: Left Arm, Patient Position: Sitting, Cuff Size: Large)   Pulse 88   Temp 97.9 F (36.6 C)   Wt 197 lb 3.2 oz (89.4 kg)   SpO2 95%   BMI 34.93 kg/m    Subjective:    Patient ID: [name] [name], female    DOB: 1950-04-27, 66 y.o.   MRN: [id number]  HPI: [name] [name] is a 66 y.o. female  Chief Complaint  Patient presents with  . Depression  . URI    since Christmas, cough, nasal and chest congestion   UPPER RESPIRATORY TRACT INFECTION Duration: 1 week Worst symptom: coughing Fever: yes Cough: yes Shortness of breath: no Wheezing: yes Chest pain: no Chest tightness: yes Chest congestion: yes Nasal congestion: yes Runny nose: yes Post nasal drip: yes Sneezing: no Sore throat: no Swollen glands: no Sinus pressure: yes Headache: no Face pain: no Toothache: no Ear pain: no  Ear pressure: no  Eyes red/itching:no Eye drainage/crusting: no  Vomiting: no Rash: no Fatigue: yes Sick contacts: yes Strep contacts: no  Context: better Recurrent sinusitis: no Relief with OTC cold/cough medications: yes  Treatments attempted: cold/sinus, mucinex, anti-histamine and pseudoephedrine   DEPRESSION Mood status: better Satisfied with current treatment?: yes Symptom severity: mild  Duration of current treatment : 1 month Side effects: no Medication compliance: excellent compliance Psychotherapy/counseling: no  Depressed mood: no Anxious mood: yes Anhedonia: no Significant weight loss or gain: no Insomnia: no  Fatigue: yes Feelings of worthlessness or guilt: no Impaired concentration/indecisiveness: no Suicidal ideations: no Hopelessness: no Crying spells: no [facility] screen [facility] 02/03/2016 01/07/2016 08/13/2015 01/06/2015 01/06/2015  Decreased Interest 1 1 1  0 0  Down, Depressed, Hopeless 0 0 1 0 0  PHQ - 2 Score 1 1 2  0 0  Altered sleeping - - - 0 -  Tired, decreased energy - - - 1 -  Change in appetite - - - 0 -  Feeling bad or  failure about yourself  - - - 1 -  Trouble concentrating - - - 0 -  Moving slowly or fidgety/restless - - - 1 -  Suicidal thoughts - - - 0 -  PHQ-9 Score - - - 3 -   GAD 7 : Generalized Anxiety Score 02/03/2016 02/13/2015  Nervous, Anxious, on Edge 1 2  Control/stop worrying 1 2  Worry too much - different things 1 2  Trouble relaxing 1 2  Restless 1 0  Easily annoyed or irritable 1 2  Afraid - awful might happen 0 0  Total GAD 7 Score 6 10  Anxiety Difficulty Somewhat difficult Somewhat difficult     Relevant past medical, surgical, family and social history reviewed and updated as indicated. Interim medical history since our last visit reviewed. Allergies and medications reviewed and updated.  Review of Systems  Constitutional: Positive for fatigue and fever. Negative for activity change, appetite change, chills, diaphoresis and unexpected weight change.  HENT: Positive for congestion, postnasal drip, rhinorrhea, sinus pain, sinus pressure and sore throat. Negative for dental problem, drooling, ear discharge, ear pain, facial swelling, hearing loss, mouth sores, nosebleeds, sneezing, tinnitus, trouble swallowing and voice change.   Respiratory: Positive for cough, chest tightness, shortness of breath and wheezing. Negative for apnea, choking and stridor.   Cardiovascular: Negative.   Psychiatric/Behavioral: Negative.     Per HPI unless specifically indicated above     Objective:    BP 126/81 (BP Location: Left Arm, Patient Position: Sitting, Cuff Size: Large)  Pulse 88   Temp 97.9 F (36.6 C)   Wt 197 lb 3.2 oz (89.4 kg)   SpO2 95%   BMI 34.93 kg/m   Wt Readings from Last 3 Encounters:  02/03/16 197 lb 3.2 oz (89.4 kg)  01/07/16 196 lb 6.4 oz (89.1 kg)  11/11/15 196 lb 4.8 oz (89 kg)    Physical Exam  Constitutional: She is oriented to person, place, and time. She appears well-developed and well-nourished. No distress.  HENT:  Head: Normocephalic and atraumatic.    Right Ear: Hearing, tympanic membrane, external ear and ear canal normal.  Left Ear: Hearing, tympanic membrane, external ear and ear canal normal.  Nose: Mucosal edema and rhinorrhea present. Right sinus exhibits no maxillary sinus tenderness and no frontal sinus tenderness. Left sinus exhibits no maxillary sinus tenderness and no frontal sinus tenderness.  Mouth/Throat: Uvula is midline, oropharynx is clear and moist and mucous membranes are normal. No oropharyngeal exudate.  Eyes: Conjunctivae, EOM and lids are normal. Pupils are equal, round, and reactive to light. Right eye exhibits no discharge. Left eye exhibits no discharge. No scleral icterus.  Neck: Normal range of motion. Neck supple. No JVD present. No tracheal deviation present. No thyromegaly present.  Cardiovascular: Normal rate, regular rhythm, normal heart sounds and intact distal pulses.  Exam reveals no gallop and no friction rub.   No murmur heard. Pulmonary/Chest: Effort normal. No stridor. No respiratory distress. She has wheezes. She has no rales. She exhibits no tenderness.  Musculoskeletal: Normal range of motion.  Lymphadenopathy:    She has cervical adenopathy.  Neurological: She is alert and oriented to person, place, and time.  Skin: Skin is warm, dry and intact. No rash noted. She is not diaphoretic. No erythema. No pallor.  Psychiatric: She has a normal mood and affect. Her speech is normal and behavior is normal. Judgment and thought content normal. Cognition and memory are normal.  Nursing note and vitals reviewed.   Results for orders placed or performed in visit on 01/07/16  Microscopic Examination  Result Value Ref Range   WBC, UA 0-5 0 - 5 /hpf   RBC, UA 0-2 0 - 2 /hpf   Epithelial Cells (non renal) 0-10 0 - 10 /hpf   Crystals Present (A) N/A   Crystal Type Amorphous Sediment N/A   Mucus, UA Present (A) Not Estab.   Bacteria, UA Moderate (A) None seen/Few  CBC with Differential/Platelet  Result  Value Ref Range   WBC 6.1 3.4 - 10.8 x10E3/uL   RBC 4.82 3.77 - 5.28 x10E6/uL   Hemoglobin 13.9 11.1 - 15.9 g/dL   Hematocrit 16.1 09.6 - 46.6 %   MCV 88 79 - 97 fL   MCH 28.8 26.6 - 33.0 pg   MCHC 32.8 31.5 - 35.7 g/dL   RDW 04.5 40.9 - 81.1 %   Platelets 248 150 - 379 x10E3/uL   Neutrophils 61 Not Estab. %   Lymphs 26 Not Estab. %   Monocytes 8 Not Estab. %   Eos 4 Not Estab. %   Basos 1 Not Estab. %   Neutrophils Absolute 3.7 1.4 - 7.0 x10E3/uL   Lymphocytes Absolute 1.6 0.7 - 3.1 x10E3/uL   Monocytes Absolute 0.5 0.1 - 0.9 x10E3/uL   EOS (ABSOLUTE) 0.2 0.0 - 0.4 x10E3/uL   Basophils Absolute 0.0 0.0 - 0.2 x10E3/uL   Immature Granulocytes 0 Not Estab. %   Immature Grans (Abs) 0.0 0.0 - 0.1 x10E3/uL  Bayer DCA Hb A1c Waived  Result Value Ref Range   Bayer DCA Hb A1c Waived 5.6 <7.0 %  Comprehensive metabolic panel  Result Value Ref Range   Glucose 102 (H) 65 - 99 mg/dL   BUN 23 8 - 27 mg/dL   Creatinine, Ser 1.61 0.57 - 1.00 mg/dL   GFR calc non Af Amer 79 >59 mL/min/1.73   GFR calc Af Amer 92 >59 mL/min/1.73   BUN/Creatinine Ratio 29 (H) 12 - 28   Sodium 145 (H) 134 - 144 mmol/L   Potassium 4.0 3.5 - 5.2 mmol/L   Chloride 103 96 - 106 mmol/L   CO2 23 18 - 29 mmol/L   Calcium 8.5 (L) 8.7 - 10.3 mg/dL   Total Protein 6.2 6.0 - 8.5 g/dL   Albumin 4.2 3.6 - 4.8 g/dL   Globulin, Total 2.0 1.5 - 4.5 g/dL   Albumin/Globulin Ratio 2.1 1.2 - 2.2   Bilirubin Total 0.3 0.0 - 1.2 mg/dL   Alkaline Phosphatase 56 39 - 117 IU/L   AST 21 0 - 40 IU/L   ALT 19 0 - 32 IU/L  Lipid Panel Piccolo, Waived  Result Value Ref Range   Cholesterol Piccolo, Waived 203 (H) <200 mg/dL   HDL Chol Piccolo, Waived 44 (L) >59 mg/dL   Triglycerides Piccolo,Waived 190 (H) <150 mg/dL   Chol/HDL Ratio Piccolo,Waive 4.6 mg/dL   LDL Chol Calc Piccolo Waived 120 (H) <100 mg/dL   VLDL Chol Calc Piccolo,Waive 38 (H) <30 mg/dL  Microalbumin, Urine Waived  Result Value Ref Range   Microalb, Ur Waived 30  (H) 0 - 19 mg/L   Creatinine, Urine Waived 300 10 - 300 mg/dL   Microalb/Creat Ratio <30 <30 mg/g  TSH  Result Value Ref Range   TSH 1.010 0.450 - 4.500 uIU/mL  UA/M w/rflx Culture, Routine  Result Value Ref Range   Specific Gravity, UA 1.025 1.005 - 1.030   pH, UA 5.5 5.0 - 7.5   Color, UA Yellow Yellow   Appearance Ur Clear Clear   Leukocytes, UA Negative Negative   Protein, UA Trace (A) Negative/Trace   Glucose, UA Negative Negative   Ketones, UA Negative Negative   RBC, UA Trace (A) Negative   Bilirubin, UA Negative Negative   Urobilinogen, Ur 0.2 0.2 - 1.0 mg/dL   Nitrite, UA Negative Negative   Microscopic Examination See below:    Urinalysis Reflex Comment   VITAMIN D 25 Hydroxy (Vit-D Deficiency, Fractures)  Result Value Ref Range   Vit D, 25-Hydroxy 18.9 (L) 30.0 - 100.0 ng/mL  Urine Culture, Routine  Result Value Ref Range   Urine Culture, Routine Final report    Urine Culture result 1 Comment       Assessment & Plan:   Problem List Items Addressed This Visit      Other   Depression    Doing much better. Continue current regimen. Recheck 6 months. Call with any concerns.        Other Visit Diagnoses    Acute bronchitis, unspecified organism    -  Primary   Will treat with azithromycin and albuterol. Tessalon perles as needed. Call with any concerns. Recheck lungs 2 weeks.        Follow up plan: Return When back from [place], for Lung recheck.

## 2016-02-17 ENCOUNTER — Telehealth: Payer: Self-pay | Admitting: Family Medicine

## 2016-02-17 MED ORDER — BUSPIRONE HCL 5 MG PO TABS: 5.0000 mg | ORAL_TABLET | Freq: Three times a day (TID) | ORAL | 1 refills | Status: DC

## 2016-02-17 MED ORDER — ALBUTEROL SULFATE HFA 108 (90 BASE) MCG/ACT IN AERS: 2.0000 | INHALATION_SPRAY | Freq: Four times a day (QID) | RESPIRATORY_TRACT | 0 refills | Status: DC | PRN

## 2016-02-17 NOTE — Telephone Encounter (Signed)
Patient notified

## 2016-02-17 NOTE — Telephone Encounter (Addendum)
Please let her know that her insurance isn't going to cover her hydroxyzine, so I've sent over an Rx for buspar, which they will cover. Let me know if she has any trouble with it. They also made me change her inhaler, so there's a new one at her pharmacy.

## 2016-03-17 ENCOUNTER — Other Ambulatory Visit: Payer: Self-pay | Admitting: Family Medicine

## 2016-03-17 DIAGNOSIS — Z1231 Encounter for screening mammogram for malignant neoplasm of breast: Secondary | ICD-10-CM

## 2016-04-09 ENCOUNTER — Encounter: Payer: Self-pay | Admitting: Family Medicine

## 2016-04-09 ENCOUNTER — Ambulatory Visit
Admission: RE | Admit: 2016-04-09 | Discharge: 2016-04-09 | Disposition: A | Discharge status: Home / Self Care | Payer: Medicare Other | Source: Ambulatory Visit | Attending: Family Medicine | Admitting: Family Medicine

## 2016-04-09 DIAGNOSIS — Z1231 Encounter for screening mammogram for malignant neoplasm of breast: Secondary | ICD-10-CM | POA: Diagnosis present

## 2016-04-12 ENCOUNTER — Other Ambulatory Visit: Payer: Self-pay | Admitting: Family Medicine

## 2016-04-26 ENCOUNTER — Other Ambulatory Visit: Payer: Self-pay | Admitting: Family Medicine

## 2016-05-24 ENCOUNTER — Other Ambulatory Visit: Payer: Self-pay | Admitting: Family Medicine

## 2016-07-08 ENCOUNTER — Ambulatory Visit (INDEPENDENT_AMBULATORY_CARE_PROVIDER_SITE_OTHER): Payer: Medicare Other | Admitting: Family Medicine

## 2016-07-08 ENCOUNTER — Encounter: Payer: Self-pay | Admitting: Family Medicine

## 2016-07-08 VITALS — BP 132/72 | HR 71 | Temp 97.6°F | Wt 200.6 lb

## 2016-07-08 DIAGNOSIS — R232 Flushing: Secondary | ICD-10-CM

## 2016-07-08 MED ORDER — GABAPENTIN 100 MG PO CAPS: ORAL_CAPSULE | ORAL | 3 refills | Status: DC

## 2016-07-08 NOTE — Progress Notes (Signed)
BP 132/72   Pulse 71   Temp 97.6 F (36.4 C)   Wt 200 lb 9.6 oz (91 kg)   SpO2 96%   BMI 35.53 kg/m    Subjective:    Patient ID: [name] [name], female    DOB: [birth year], 66 y.o.   MRN: [id number]  HPI: [name] [name] is a 66 y.o. female  Chief Complaint  Patient presents with  . Hot Flashes   MENOPAUSAL SYMPTOMS- feeling like she is going to burn up inside. She states that she doesn't feel right Duration: about a month, not all the time, not every day. Happening a couple of times a day, lasts for anywhere from minutes to a couple of hours Symptom severity: severe Hot flashes: yes Night sweats: no Sleep disturbances: no Vaginal dryness: No Dyspareunia: N/A Decreased libido: N/A Emotional lability: yes Stress incontinence: yes Previous HRT/pharmacotherapy: no Hysterectomy: yes Medications to Hormonal Therapy:     Undiagnosed vaginal bleeding: no    Breast cancer: no    Endometrial cancer: no    Coronary disease: no    Cerebrovascular disease: no    Venous thromboembolic disease: no   Relevant past medical, surgical, family and social history reviewed and updated as indicated. Interim medical history since our last visit reviewed. Allergies and medications reviewed and updated.  Review of Systems  Constitutional: Positive for diaphoresis. Negative for activity change, appetite change, chills, fatigue, fever and unexpected weight change.  Respiratory: Negative.   Cardiovascular: Negative.   Psychiatric/Behavioral: Negative.     Per HPI unless specifically indicated above     Objective:    BP 132/72   Pulse 71   Temp 97.6 F (36.4 C)   Wt 200 lb 9.6 oz (91 kg)   SpO2 96%   BMI 35.53 kg/m   Wt Readings from Last 3 Encounters:  07/08/16 200 lb 9.6 oz (91 kg)  02/03/16 197 lb 3.2 oz (89.4 kg)  01/07/16 196 lb 6.4 oz (89.1 kg)    Physical Exam  Constitutional: She is oriented to person, place, and time. She appears well-developed and well-nourished.  No distress.  HENT:  Head: Normocephalic and atraumatic.  Right Ear: Hearing normal.  Left Ear: Hearing normal.  Nose: Nose normal.  Eyes: Conjunctivae and lids are normal. Right eye exhibits no discharge. Left eye exhibits no discharge. No scleral icterus.  Cardiovascular: Normal rate, regular rhythm, normal heart sounds and intact distal pulses.  Exam reveals no gallop and no friction rub.   No murmur heard. Pulmonary/Chest: Effort normal and breath sounds normal. No respiratory distress. She has no wheezes. She has no rales. She exhibits no tenderness.  Musculoskeletal: Normal range of motion.  Neurological: She is alert and oriented to person, place, and time.  Skin: Skin is warm, dry and intact. No rash noted. She is not diaphoretic. No erythema. No pallor.  Psychiatric: She has a normal mood and affect. Her speech is normal and behavior is normal. Judgment and thought content normal. Cognition and memory are normal.  Nursing note and vitals reviewed.   Results for orders placed or performed in visit on 01/07/16  Microscopic Examination  Result Value Ref Range   WBC, UA 0-5 0 - 5 /hpf   RBC, UA 0-2 0 - 2 /hpf   Epithelial Cells (non renal) 0-10 0 - 10 /hpf   Crystals Present (A) N/A   Crystal Type Amorphous Sediment N/A   Mucus, UA Present (A) Not Estab.   Bacteria, UA Moderate (  A) None seen/Few  CBC with Differential/Platelet  Result Value Ref Range   WBC 6.1 3.4 - 10.8 x10E3/uL   RBC 4.82 3.77 - 5.28 x10E6/uL   Hemoglobin 13.9 11.1 - 15.9 g/dL   Hematocrit 29.5 62.1 - 46.6 %   MCV 88 79 - 97 fL   MCH 28.8 26.6 - 33.0 pg   MCHC 32.8 31.5 - 35.7 g/dL   RDW 30.8 65.7 - 84.6 %   Platelets 248 150 - 379 x10E3/uL   Neutrophils 61 Not Estab. %   Lymphs 26 Not Estab. %   Monocytes 8 Not Estab. %   Eos 4 Not Estab. %   Basos 1 Not Estab. %   Neutrophils Absolute 3.7 1.4 - 7.0 x10E3/uL   Lymphocytes Absolute 1.6 0.7 - 3.1 x10E3/uL   Monocytes Absolute 0.5 0.1 - 0.9 x10E3/uL     EOS (ABSOLUTE) 0.2 0.0 - 0.4 x10E3/uL   Basophils Absolute 0.0 0.0 - 0.2 x10E3/uL   Immature Granulocytes 0 Not Estab. %   Immature Grans (Abs) 0.0 0.0 - 0.1 x10E3/uL  Bayer DCA Hb A1c Waived  Result Value Ref Range   Bayer DCA Hb A1c Waived 5.6 <7.0 %  Comprehensive metabolic panel  Result Value Ref Range   Glucose 102 (H) 65 - 99 mg/dL   BUN 23 8 - 27 mg/dL   Creatinine, Ser 9.62 0.57 - 1.00 mg/dL   GFR calc non Af Amer 79 >59 mL/min/1.73   GFR calc Af Amer 92 >59 mL/min/1.73   BUN/Creatinine Ratio 29 (H) 12 - 28   Sodium 145 (H) 134 - 144 mmol/L   Potassium 4.0 3.5 - 5.2 mmol/L   Chloride 103 96 - 106 mmol/L   CO2 23 18 - 29 mmol/L   Calcium 8.5 (L) 8.7 - 10.3 mg/dL   Total Protein 6.2 6.0 - 8.5 g/dL   Albumin 4.2 3.6 - 4.8 g/dL   Globulin, Total 2.0 1.5 - 4.5 g/dL   Albumin/Globulin Ratio 2.1 1.2 - 2.2   Bilirubin Total 0.3 0.0 - 1.2 mg/dL   Alkaline Phosphatase 56 39 - 117 IU/L   AST 21 0 - 40 IU/L   ALT 19 0 - 32 IU/L  Lipid Panel Piccolo, Waived  Result Value Ref Range   Cholesterol Piccolo, Waived 203 (H) <200 mg/dL   HDL Chol Piccolo, Waived 44 (L) >59 mg/dL   Triglycerides Piccolo,Waived 190 (H) <150 mg/dL   Chol/HDL Ratio Piccolo,Waive 4.6 mg/dL   LDL Chol Calc Piccolo Waived 120 (H) <100 mg/dL   VLDL Chol Calc Piccolo,Waive 38 (H) <30 mg/dL  Microalbumin, Urine Waived  Result Value Ref Range   Microalb, Ur Waived 30 (H) 0 - 19 mg/L   Creatinine, Urine Waived 300 10 - 300 mg/dL   Microalb/Creat Ratio <30 <30 mg/g  TSH  Result Value Ref Range   TSH 1.010 0.450 - 4.500 uIU/mL  UA/M w/rflx Culture, Routine  Result Value Ref Range   Specific Gravity, UA 1.025 1.005 - 1.030   pH, UA 5.5 5.0 - 7.5   Color, UA Yellow Yellow   Appearance Ur Clear Clear   Leukocytes, UA Negative Negative   Protein, UA Trace (A) Negative/Trace   Glucose, UA Negative Negative   Ketones, UA Negative Negative   RBC, UA Trace (A) Negative   Bilirubin, UA Negative Negative    Urobilinogen, Ur 0.2 0.2 - 1.0 mg/dL   Nitrite, UA Negative Negative   Microscopic Examination See below:    Urinalysis Reflex Comment  VITAMIN D 25 Hydroxy (Vit-D Deficiency, Fractures)  Result Value Ref Range   Vit D, 25-Hydroxy 18.9 (L) 30.0 - 100.0 ng/mL  Urine Culture, Routine  Result Value Ref Range   Urine Culture, Routine Final report    Organism ID, Bacteria Comment       Assessment & Plan:   Problem List Items Addressed This Visit    None    Visit Diagnoses    Flushing    -  Primary   Will check labs and start gabapentin for her symptoms. Recheck 1 month. Call with any concerns.    Relevant Orders   Comprehensive metabolic panel   CBC with Differential/Platelet   TSH       Follow up plan: Return in about 4 weeks (around 08/05/2016) for follow up flushing.

## 2016-07-09 ENCOUNTER — Encounter: Payer: Self-pay | Admitting: Family Medicine

## 2016-07-09 LAB — CBC WITH DIFFERENTIAL/PLATELET
Basophils Absolute: 0 10*3/uL (ref 0.0–0.2)
Basos: 1 %
EOS (ABSOLUTE): 0.2 10*3/uL (ref 0.0–0.4)
EOS: 3 %
HEMATOCRIT: 43.5 % (ref 34.0–46.6)
Hemoglobin: 13.9 g/dL (ref 11.1–15.9)
Immature Grans (Abs): 0 10*3/uL (ref 0.0–0.1)
Immature Granulocytes: 0 %
LYMPHS ABS: 1.7 10*3/uL (ref 0.7–3.1)
Lymphs: 28 %
MCH: 28 pg (ref 26.6–33.0)
MCHC: 32 g/dL (ref 31.5–35.7)
MCV: 88 fL (ref 79–97)
MONOS ABS: 0.4 10*3/uL (ref 0.1–0.9)
Monocytes: 7 %
NEUTROS ABS: 3.6 10*3/uL (ref 1.4–7.0)
Neutrophils: 61 %
Platelets: 224 10*3/uL (ref 150–379)
RBC: 4.96 x10E6/uL (ref 3.77–5.28)
RDW: 13.5 % (ref 12.3–15.4)
WBC: 6 10*3/uL (ref 3.4–10.8)

## 2016-07-09 LAB — COMPREHENSIVE METABOLIC PANEL
ALBUMIN: 4.4 g/dL (ref 3.6–4.8)
ALK PHOS: 66 IU/L (ref 39–117)
ALT: 38 IU/L — ABNORMAL HIGH (ref 0–32)
AST: 34 IU/L (ref 0–40)
Albumin/Globulin Ratio: 2 (ref 1.2–2.2)
BUN / CREAT RATIO: 15 (ref 12–28)
BUN: 13 mg/dL (ref 8–27)
Bilirubin Total: 0.2 mg/dL (ref 0.0–1.2)
CO2: 26 mmol/L (ref 18–29)
CREATININE: 0.87 mg/dL (ref 0.57–1.00)
Calcium: 9.6 mg/dL (ref 8.7–10.3)
Chloride: 104 mmol/L (ref 96–106)
GFR calc Af Amer: 80 mL/min/{1.73_m2} (ref >59)
GFR calc non Af Amer: 69 mL/min/{1.73_m2} (ref >59)
GLOBULIN, TOTAL: 2.2 g/dL (ref 1.5–4.5)
Glucose: 98 mg/dL (ref 65–99)
Potassium: 4.1 mmol/L (ref 3.5–5.2)
SODIUM: 144 mmol/L (ref 134–144)
Total Protein: 6.6 g/dL (ref 6.0–8.5)

## 2016-07-09 LAB — TSH: TSH: 1.11 u[IU]/mL (ref 0.450–4.500)

## 2016-08-02 ENCOUNTER — Other Ambulatory Visit: Payer: Self-pay | Admitting: Family Medicine

## 2016-08-12 ENCOUNTER — Encounter: Payer: Self-pay | Admitting: Family Medicine

## 2016-08-12 ENCOUNTER — Ambulatory Visit
Admission: RE | Admit: 2016-08-12 | Discharge: 2016-08-12 | Disposition: A | Discharge status: Home / Self Care | Payer: Medicare Other | Source: Ambulatory Visit | Attending: Family Medicine | Admitting: Family Medicine

## 2016-08-12 ENCOUNTER — Telehealth: Payer: Self-pay | Admitting: Family Medicine

## 2016-08-12 ENCOUNTER — Ambulatory Visit (INDEPENDENT_AMBULATORY_CARE_PROVIDER_SITE_OTHER): Payer: Medicare Other | Admitting: Family Medicine

## 2016-08-12 VITALS — BP 124/70 | HR 93 | Wt 205.4 lb

## 2016-08-12 DIAGNOSIS — R0781 Pleurodynia: Secondary | ICD-10-CM

## 2016-08-12 DIAGNOSIS — R232 Flushing: Secondary | ICD-10-CM

## 2016-08-12 DIAGNOSIS — Z1159 Encounter for screening for other viral diseases: Secondary | ICD-10-CM | POA: Diagnosis not present

## 2016-08-12 MED ORDER — NAPROXEN 500 MG PO TABS: 500.0000 mg | ORAL_TABLET | Freq: Two times a day (BID) | ORAL | 0 refills | Status: DC

## 2016-08-12 NOTE — Patient Instructions (Addendum)
Rib Fracture ° °A rib fracture is a break or crack in one of the bones of the ribs. The ribs are a group of long, curved bones that wrap around your chest and attach to your spine. They protect your lungs and other organs in the chest cavity. A broken or cracked rib is often painful, but most do not cause other problems. Most rib fractures heal on their own over time. However, rib fractures can be more serious if multiple ribs are broken or if broken ribs move out of place and push against other structures. °What are the causes? °· A direct blow to the chest. For example, this could happen during contact sports, a car accident, or a fall against a hard object. °· Repetitive movements with high force, such as pitching a baseball or having severe coughing spells. °What are the signs or symptoms? °· Pain when you breathe in or cough. °· Pain when someone presses on the injured area. °How is this diagnosed? °Your caregiver will perform a physical exam. Various imaging tests may be ordered to confirm the diagnosis and to look for related injuries. These tests may include a chest X-ray, computed tomography (CT), magnetic resonance imaging (MRI), or a bone scan. °How is this treated? °Rib fractures usually heal on their own in 1-3 months. The longer healing period is often associated with a continued cough or other aggravating activities. During the healing period, pain control is very important. Medication is usually given to control pain. Hospitalization or surgery may be needed for more severe injuries, such as those in which multiple ribs are broken or the ribs have moved out of place. °Follow these instructions at home: °· Avoid strenuous activity and any activities or movements that cause pain. Be careful during activities and avoid bumping the injured rib. °· Gradually increase activity as directed by your caregiver. °· Only take over-the-counter or prescription medications as directed by your caregiver. Do not take  other medications without asking your caregiver first. °· Apply ice to the injured area for the first 1-2 days after you have been treated or as directed by your caregiver. Applying ice helps to reduce inflammation and pain. °? Put ice in a plastic bag. °? Place a towel between your skin and the bag. °? Leave the ice on for 15-20 minutes at a time, every 2 hours while you are awake. °· Perform deep breathing as directed by your caregiver. This will help prevent pneumonia, which is a common complication of a broken rib. Your caregiver may instruct you to: °? Take deep breaths several times a day. °? Try to cough several times a day, holding a pillow against the injured area. °? Use a device called an incentive spirometer to practice deep breathing several times a day. °· Drink enough fluids to keep your urine clear or pale yellow. This will help you avoid constipation. °· Do not wear a rib belt or binder. These restrict breathing, which can lead to pneumonia. °Get help right away if: °· You have a fever. °· You have difficulty breathing or shortness of breath. °· You develop a continual cough, or you cough up thick or bloody sputum. °· You feel sick to your stomach (nausea), throw up (vomit), or have abdominal pain. °· You have worsening pain not controlled with medications. °This information is not intended to replace advice given to you by your health care provider. Make sure you discuss any questions you have with your health care provider. °Document Released: 01/18/2005   Document Revised: 06/26/2015 Document Reviewed: 03/22/2012 °Elsevier Interactive Patient Education © 2018 [employer] ° °

## 2016-08-12 NOTE — Progress Notes (Signed)
BP 124/70   Pulse 93   Wt 205 lb 7 oz (93.2 kg)   SpO2 94%   BMI 36.39 kg/m    Subjective:    Patient ID: [name] [name], female    DOB: May 13, 1950, 66 y.o.   MRN: [id number]  HPI: [name] [name] is a 66 y.o. female  Chief Complaint  Patient presents with  . flushing  . Fall    Patient fell on 08/06/16, she is having pain in her right side when she takes a breath.    Didn't take the gabapentin for her hot flashes. She is doing OK with them now and is not needing anything right now. She is feeling pretty comfortable.  She does note that she fell on 08/06/16. She states that she was going to the front porch and the next thing she knew she was on the ground. She didn't lose consciousness, thinks she fell over her foot. She notes that she fell on her R side. Since then she has been having a lot of pain under her R breast. She is also feeling short of breath because it's hard to breath with the pain. She didn't have any bruising or bleeding. Her pain is sharp. It does not radiate. It's better with ibuprofen and worse with deep breathing. She is afraid she cracked a rib. She is otherwise doing well with no other concerns or complaints at this time.   Relevant past medical, surgical, family and social history reviewed and updated as indicated. Interim medical history since our last visit reviewed. Allergies and medications reviewed and updated.  Review of Systems  Constitutional: Negative.   Respiratory: Positive for shortness of breath. Negative for apnea, cough, choking, chest tightness, wheezing and stridor.   Cardiovascular: Positive for chest pain. Negative for palpitations and leg swelling.  Musculoskeletal: Positive for myalgias. Negative for arthralgias, gait problem, joint swelling, neck pain and neck stiffness.  Psychiatric/Behavioral: Negative.     Per HPI unless specifically indicated above     Objective:    BP 124/70   Pulse 93   Wt 205 lb 7 oz (93.2 kg)   SpO2 94%    BMI 36.39 kg/m   Wt Readings from Last 3 Encounters:  08/12/16 205 lb 7 oz (93.2 kg)  07/08/16 200 lb 9.6 oz (91 kg)  02/03/16 197 lb 3.2 oz (89.4 kg)    Physical Exam  Constitutional: She is oriented to person, place, and time. She appears well-developed and well-nourished. No distress.  HENT:  Head: Normocephalic and atraumatic.  Right Ear: Hearing normal.  Left Ear: Hearing normal.  Nose: Nose normal.  Eyes: Conjunctivae and lids are normal. Right eye exhibits no discharge. Left eye exhibits no discharge. No scleral icterus.  Cardiovascular: Normal rate, regular rhythm, normal heart sounds and intact distal pulses.  Exam reveals no gallop and no friction rub.   No murmur heard. Pulmonary/Chest: Effort normal and breath sounds normal. No respiratory distress. She has no wheezes. She has no rales. She exhibits no tenderness.  Musculoskeletal: Normal range of motion.  Tenderness over 10th rib anteriorly on the R  Neurological: She is alert and oriented to person, place, and time.  Skin: Skin is warm, dry and intact. No rash noted. She is not diaphoretic. No erythema. No pallor.  Psychiatric: She has a normal mood and affect. Her speech is normal and behavior is normal. Judgment and thought content normal. Cognition and memory are normal.  Nursing note and vitals reviewed.  Results for orders placed or performed in visit on 07/08/16  Comprehensive metabolic panel  Result Value Ref Range   Glucose 98 65 - 99 mg/dL   BUN 13 8 - 27 mg/dL   Creatinine, Ser 1.610.87 0.57 - 1.00 mg/dL   GFR calc non Af Amer 69 >59 mL/min/1.73   GFR calc Af Amer 80 >59 mL/min/1.73   BUN/Creatinine Ratio 15 12 - 28   Sodium 144 134 - 144 mmol/L   Potassium 4.1 3.5 - 5.2 mmol/L   Chloride 104 96 - 106 mmol/L   CO2 26 18 - 29 mmol/L   Calcium 9.6 8.7 - 10.3 mg/dL   Total Protein 6.6 6.0 - 8.5 g/dL   Albumin 4.4 3.6 - 4.8 g/dL   Globulin, Total 2.2 1.5 - 4.5 g/dL   Albumin/Globulin Ratio 2.0 1.2 - 2.2    Bilirubin Total 0.2 0.0 - 1.2 mg/dL   Alkaline Phosphatase 66 39 - 117 IU/L   AST 34 0 - 40 IU/L   ALT 38 (H) 0 - 32 IU/L  CBC with Differential/Platelet  Result Value Ref Range   WBC 6.0 3.4 - 10.8 x10E3/uL   RBC 4.96 3.77 - 5.28 x10E6/uL   Hemoglobin 13.9 11.1 - 15.9 g/dL   Hematocrit 09.643.5 04.534.0 - 46.6 %   MCV 88 79 - 97 fL   MCH 28.0 26.6 - 33.0 pg   MCHC 32.0 31.5 - 35.7 g/dL   RDW 40.913.5 81.112.3 - 91.415.4 %   Platelets 224 150 - 379 x10E3/uL   Neutrophils 61 Not Estab. %   Lymphs 28 Not Estab. %   Monocytes 7 Not Estab. %   Eos 3 Not Estab. %   Basos 1 Not Estab. %   Neutrophils Absolute 3.6 1.4 - 7.0 x10E3/uL   Lymphocytes Absolute 1.7 0.7 - 3.1 x10E3/uL   Monocytes Absolute 0.4 0.1 - 0.9 x10E3/uL   EOS (ABSOLUTE) 0.2 0.0 - 0.4 x10E3/uL   Basophils Absolute 0.0 0.0 - 0.2 x10E3/uL   Immature Granulocytes 0 Not Estab. %   Immature Grans (Abs) 0.0 0.0 - 0.1 x10E3/uL  TSH  Result Value Ref Range   TSH 1.110 0.450 - 4.500 uIU/mL      Assessment & Plan:   Problem List Items Addressed This Visit    None    Visit Diagnoses    Encounter for hepatitis C screening test for low risk patient    -  Primary   Labs checked today. Await results.    Relevant Orders   Hepatitis C antibody   Rib pain on right side       Cocern for rib fx. Will obtain x-ray. Information given. Stop ibuprofen and start naproxen. Deep breathing. Call with any concerns.    Relevant Orders   DG Ribs Unilateral Right   Flushing       Did not take gabapentin. Will start it if it starts bothering her again.        Follow up plan: Return for December Wellness.

## 2016-08-12 NOTE — Telephone Encounter (Signed)
Patient notified

## 2016-08-12 NOTE — Telephone Encounter (Signed)
Please let her know that her x-ray was normal. No fracture, probably just a bruise.

## 2016-08-13 ENCOUNTER — Encounter: Payer: Self-pay | Admitting: Family Medicine

## 2016-08-13 LAB — HEPATITIS C ANTIBODY: Hep C Virus Ab: <0.1 s/co ratio (ref 0.0–0.9)

## 2016-08-30 ENCOUNTER — Other Ambulatory Visit: Payer: Self-pay | Admitting: Family Medicine

## 2016-09-15 ENCOUNTER — Other Ambulatory Visit: Payer: Self-pay | Admitting: Family Medicine

## 2016-10-08 ENCOUNTER — Encounter: Payer: Self-pay | Admitting: Family Medicine

## 2016-10-08 ENCOUNTER — Ambulatory Visit (INDEPENDENT_AMBULATORY_CARE_PROVIDER_SITE_OTHER): Payer: Medicare Other | Admitting: Family Medicine

## 2016-10-08 VITALS — BP 162/88 | HR 71 | Temp 97.6°F | Wt 205.0 lb

## 2016-10-08 DIAGNOSIS — G43909 Migraine, unspecified, not intractable, without status migrainosus: Secondary | ICD-10-CM | POA: Diagnosis not present

## 2016-10-08 MED ORDER — SUMATRIPTAN SUCCINATE 50 MG PO TABS: 50.0000 mg | ORAL_TABLET | ORAL | 0 refills | Status: DC | PRN

## 2016-10-08 MED ORDER — KETOROLAC TROMETHAMINE 60 MG/2ML IM SOLN
60.0000 mg | Freq: Once | INTRAMUSCULAR | Status: AC
  Administered 2016-10-08: 60 mg via INTRAMUSCULAR

## 2016-10-08 MED ORDER — ONDANSETRON 4 MG PO TBDP: 4.0000 mg | ORAL_TABLET | Freq: Three times a day (TID) | ORAL | 0 refills | Status: DC | PRN

## 2016-10-08 MED ORDER — SUMATRIPTAN SUCCINATE 6 MG/0.5ML ~~LOC~~ SOLN
6.0000 mg | Freq: Once | SUBCUTANEOUS | Status: AC
  Administered 2016-10-08: 6 mg via SUBCUTANEOUS

## 2016-10-08 NOTE — Progress Notes (Signed)
   BP (!) 162/88   Pulse 71   Temp 97.6 F (36.4 C)   Wt 205 lb (93 kg)   SpO2 99%   BMI 36.31 kg/m    Subjective:    Patient ID: [name] [name], female    DOB: January 29, 1951, 66 y.o.   MRN: [id number]  HPI: [name] [name] is a 66 y.o. female  Chief Complaint  Patient presents with  . Migraine    x 3 days, across her forehead, some nausea.    Patient presents with persistent migraine around right eye/forehead x 3-4 days. Some photophobia and nausea, no visual changes, vomiting, weakness, speech difficulties. Hx of migraines, has not had one in several years. Has been taking tylenol and ibuprofen with minimal relief. Has only had one dose of medication so far today.   Relevant past medical, surgical, family and social history reviewed and updated as indicated. Interim medical history since our last visit reviewed. Allergies and medications reviewed and updated.  Review of Systems  Constitutional: Negative.   HENT: Negative.   Eyes: Positive for photophobia.  Respiratory: Negative.   Gastrointestinal: Positive for nausea.  Musculoskeletal: Negative.   Neurological: Negative.   Psychiatric/Behavioral: Negative.    Per HPI unless specifically indicated above     Objective:    BP (!) 162/88   Pulse 71   Temp 97.6 F (36.4 C)   Wt 205 lb (93 kg)   SpO2 99%   BMI 36.31 kg/m   Wt Readings from Last 3 Encounters:  10/08/16 205 lb (93 kg)  08/12/16 205 lb 7 oz (93.2 kg)  07/08/16 200 lb 9.6 oz (91 kg)    Physical Exam  Constitutional: She is oriented to person, place, and time. She appears well-developed and well-nourished.  Laying in dark exam room on exam table, appears in pain  HENT:  Head: Atraumatic.  Eyes: Pupils are equal, round, and reactive to light. Conjunctivae are normal. No scleral icterus.  Neck: Normal range of motion. Neck supple. No thyromegaly present.  Cardiovascular: Normal rate and normal heart sounds.   Pulmonary/Chest: Effort normal and breath  sounds normal.  Musculoskeletal: Normal range of motion.  Lymphadenopathy:    She has no cervical adenopathy.  Neurological: She is alert and oriented to person, place, and time. No cranial nerve deficit.  Skin: Skin is warm and dry.  Psychiatric: She has a normal mood and affect. Her behavior is normal.  Nursing note and vitals reviewed.     Assessment & Plan:   Problem List Items Addressed This Visit    None    Visit Diagnoses    Migraine without status migrainosus, not intractable, unspecified migraine type    -  Primary   IM toradol and imitrex in office today, will send oral imitrex and zofran in case no lasting relief. Continue OTC tylenol prn. Rest, good hydration.    Relevant Medications   SUMAtriptan (IMITREX) 50 MG tablet   SUMAtriptan (IMITREX) injection 6 mg (Completed)   ketorolac (TORADOL) injection 60 mg (Completed)    Return if no improvement by Monday. Strict return precautions reviewed.   Follow up plan: Return if symptoms worsen or fail to improve.

## 2016-10-11 NOTE — Patient Instructions (Signed)
Follow up as needed

## 2016-10-26 ENCOUNTER — Ambulatory Visit (INDEPENDENT_AMBULATORY_CARE_PROVIDER_SITE_OTHER): Payer: Medicare Other | Admitting: Family Medicine

## 2016-10-26 ENCOUNTER — Encounter: Payer: Self-pay | Admitting: Family Medicine

## 2016-10-26 VITALS — BP 150/84 | HR 84 | Temp 97.9°F | Wt 208.0 lb

## 2016-10-26 DIAGNOSIS — L259 Unspecified contact dermatitis, unspecified cause: Secondary | ICD-10-CM

## 2016-10-26 MED ORDER — PREDNISONE 50 MG PO TABS: 50.0000 mg | ORAL_TABLET | Freq: Every day | ORAL | 0 refills | Status: DC

## 2016-10-26 MED ORDER — TRIAMCINOLONE ACETONIDE 40 MG/ML IJ SUSP
40.0000 mg | Freq: Once | INTRAMUSCULAR | Status: AC
  Administered 2016-10-26: 40 mg via INTRAMUSCULAR

## 2016-10-26 NOTE — Progress Notes (Signed)
BP (!) 150/84 (BP Location: Left Arm, Patient Position: Sitting, Cuff Size: Large)   Pulse 84   Temp 97.9 F (36.6 C)   Wt 208 lb (94.3 kg)   SpO2 95%   BMI 36.85 kg/m    Subjective:    Patient ID: [name] [name], female    DOB: Nov 08, 1950, 66 y.o.   MRN: [id number]  HPI: [name] [name] is a 67 y.o. female  Chief Complaint  Patient presents with  . Rash    X 1.5 weeks   RASH Duration:  1.5 weeks  Location: generalized  Itching: yes Burning: no Redness: yes Oozing: no Scaling: no Blisters: no Painful: no Fevers: no Change in detergents/soaps/personal care products: no Recent illness: no Recent travel:no History of same: yes Context: better Alleviating factors: benadryl and lotion/moisturizer Treatments attempted:benadryl and lotion/moisturizer Shortness of breath: no  Throat/tongue swelling: no Myalgias/arthralgias: no  Relevant past medical, surgical, family and social history reviewed and updated as indicated. Interim medical history since our last visit reviewed. Allergies and medications reviewed and updated.  Review of Systems  Constitutional: Negative.   Respiratory: Negative.   Cardiovascular: Negative.   Skin: Positive for rash. Negative for color change, pallor and wound.  Psychiatric/Behavioral: Negative.     Per HPI unless specifically indicated above     Objective:    BP (!) 150/84 (BP Location: Left Arm, Patient Position: Sitting, Cuff Size: Large)   Pulse 84   Temp 97.9 F (36.6 C)   Wt 208 lb (94.3 kg)   SpO2 95%   BMI 36.85 kg/m   Wt Readings from Last 3 Encounters:  10/26/16 208 lb (94.3 kg)  10/08/16 205 lb (93 kg)  08/12/16 205 lb 7 oz (93.2 kg)    Physical Exam  Constitutional: She is oriented to person, place, and time. She appears well-developed and well-nourished. No distress.  HENT:  Head: Normocephalic and atraumatic.  Right Ear: Hearing normal.  Left Ear: Hearing normal.  Nose: Nose normal.  Eyes: Conjunctivae  and lids are normal. Right eye exhibits no discharge. Left eye exhibits no discharge. No scleral icterus.  Cardiovascular: Normal rate, regular rhythm, normal heart sounds and intact distal pulses.  Exam reveals no gallop and no friction rub.   No murmur heard. Pulmonary/Chest: Effort normal and breath sounds normal. No respiratory distress. She has no wheezes. She has no rales. She exhibits no tenderness.  Musculoskeletal: Normal range of motion.  Neurological: She is alert and oriented to person, place, and time.  Skin: Skin is warm, dry and intact. Rash noted. She is not diaphoretic. No erythema. No pallor.  Excoriated, erythematous papular rash on arms, chest and belly  Psychiatric: She has a normal mood and affect. Her speech is normal and behavior is normal. Judgment and thought content normal. Cognition and memory are normal.  Nursing note and vitals reviewed.   Results for orders placed or performed in visit on 08/12/16  Hepatitis C antibody  Result Value Ref Range   Hep C Virus Ab <0.1 0.0 - 0.9 s/co ratio      Assessment & Plan:   Problem List Items Addressed This Visit    None    Visit Diagnoses    Contact dermatitis, unspecified contact dermatitis type, unspecified trigger    -  Primary   Will treat with triamcinalone shot and then start prednisone burst tomorrow. Call with any concerns. Continue benadryl and lotion.    Relevant Medications   triamcinolone acetonide (KENALOG-40) injection 40 mg (  Start on 10/26/2016 11:00 AM)       Follow up plan: Return After 12/6- for annual wellness.

## 2016-11-05 ENCOUNTER — Encounter: Payer: Self-pay | Admitting: Family Medicine

## 2016-11-05 ENCOUNTER — Telehealth: Payer: Self-pay | Admitting: Family Medicine

## 2016-11-05 ENCOUNTER — Ambulatory Visit (INDEPENDENT_AMBULATORY_CARE_PROVIDER_SITE_OTHER): Payer: Medicare Other | Admitting: Family Medicine

## 2016-11-05 VITALS — BP 150/88 | HR 79 | Temp 97.5°F | Wt 202.0 lb

## 2016-11-05 DIAGNOSIS — R112 Nausea with vomiting, unspecified: Secondary | ICD-10-CM | POA: Diagnosis not present

## 2016-11-05 DIAGNOSIS — R197 Diarrhea, unspecified: Secondary | ICD-10-CM

## 2016-11-05 LAB — CBC WITH DIFFERENTIAL/PLATELET
HEMOGLOBIN: 15.4 g/dL (ref 11.1–15.9)
Hematocrit: 48.7 % — ABNORMAL HIGH (ref 34.0–46.6)
Lymphocytes Absolute: 1.9 10*3/uL (ref 0.7–3.1)
Lymphs: 18 %
MCH: 28.1 pg (ref 26.6–33.0)
MCHC: 31.6 g/dL (ref 31.5–35.7)
MCV: 89 fL (ref 79–97)
MID (Absolute): 0.4 10*3/uL (ref 0.1–1.6)
MID: 3 %
Neutrophils Absolute: 8.3 10*3/uL — ABNORMAL HIGH (ref 1.4–7.0)
Neutrophils: 78 %
Platelets: 278 10*3/uL (ref 150–379)
RBC: 5.49 x10E6/uL — AB (ref 3.77–5.28)
RDW: 13.5 % (ref 12.3–15.4)
WBC: 10.6 10*3/uL (ref 3.4–10.8)

## 2016-11-05 MED ORDER — PROMETHAZINE HCL 25 MG PO TABS: 25.0000 mg | ORAL_TABLET | Freq: Four times a day (QID) | ORAL | 0 refills | Status: DC | PRN

## 2016-11-05 MED ORDER — PROMETHAZINE HCL 25 MG/ML IJ SOLN
25.0000 mg | Freq: Once | INTRAMUSCULAR | Status: AC
  Administered 2016-11-05: 25 mg via INTRAMUSCULAR

## 2016-11-05 NOTE — Progress Notes (Signed)
BP (!) 150/88   Pulse 79   Temp (!) 97.5 F (36.4 C) (Oral)   Wt 202 lb (91.6 kg)   SpO2 98%   BMI 35.78 kg/m    Subjective:    Patient ID: [name] [name], female    DOB: [birth year], 66 y.o.   MRN: [id number]  HPI: [name] [name] is a 66 y.o. female  Chief Complaint  Patient presents with  . Emesis  . Diarrhea   Patient presents with N/V/D x 4 days. Denies fever, chills, aches, recent travel, new food exposures, new medications, sick contacts. Having 4-5 non-bloody loose BMs daily. Able to keep down bland foods and liquids. Not trying anything OTC for sxs.   Relevant past medical, surgical, family and social history reviewed and updated as indicated. Interim medical history since our last visit reviewed. Allergies and medications reviewed and updated.  Review of Systems  Constitutional: Negative.   Respiratory: Negative.   Cardiovascular: Negative.   Gastrointestinal: Positive for diarrhea, nausea and vomiting.  Genitourinary: Negative.   Musculoskeletal: Negative.   Neurological: Negative.   Psychiatric/Behavioral: Negative.    Per HPI unless specifically indicated above     Objective:    BP (!) 150/88   Pulse 79   Temp (!) 97.5 F (36.4 C) (Oral)   Wt 202 lb (91.6 kg)   SpO2 98%   BMI 35.78 kg/m   Wt Readings from Last 3 Encounters:  11/05/16 202 lb (91.6 kg)  10/26/16 208 lb (94.3 kg)  10/08/16 205 lb (93 kg)    Physical Exam  Constitutional: She is oriented to person, place, and time. She appears well-developed and well-nourished. No distress.  HENT:  Head: Atraumatic.  Eyes: Pupils are equal, round, and reactive to light. Conjunctivae are normal.  Neck: Normal range of motion. Neck supple.  Cardiovascular: Normal rate and normal heart sounds.   Pulmonary/Chest: Effort normal and breath sounds normal. No respiratory distress.  Abdominal: Soft. Bowel sounds are normal. She exhibits no distension and no mass. There is tenderness (very mild, diffuse  ttp). There is no guarding.  Musculoskeletal: Normal range of motion.  Neurological: She is alert and oriented to person, place, and time.  Skin: Skin is warm and dry.  Psychiatric: She has a normal mood and affect. Her behavior is normal.  Nursing note and vitals reviewed.  Results for orders placed or performed in visit on 11/05/16  CBC With Differential/Platelet  Result Value Ref Range   WBC 10.6 3.4 - 10.8 x10E3/uL   RBC 5.49 (H) 3.77 - 5.28 x10E6/uL   Hemoglobin 15.4 11.1 - 15.9 g/dL   Hematocrit 78.2 (H) 95.6 - 46.6 %   MCV 89 79 - 97 fL   MCH 28.1 26.6 - 33.0 pg   MCHC 31.6 31.5 - 35.7 g/dL   RDW 21.3 08.6 - 57.8 %   Platelets 278 150 - 379 x10E3/uL   Neutrophils 78 Not Estab. %   Lymphs 18 Not Estab. %   MID 3 Not Estab. %   Neutrophils Absolute 8.3 (H) 1.4 - 7.0 x10E3/uL   Lymphocytes Absolute 1.9 0.7 - 3.1 x10E3/uL   MID (Absolute) 0.4 0.1 - 1.6 X10E3/uL      Assessment & Plan:   Problem List Items Addressed This Visit    None    Visit Diagnoses    Nausea vomiting and diarrhea    -  Primary   WBC WNL, afebrile and tolerating PO. IM Phenergan today, PO phenergan and imodium, BRAT  diet, push fluids. If no significant improvement over weekend f/u   Relevant Medications   promethazine (PHENERGAN) injection 25 mg (Completed)   Other Relevant Orders   CBC With Differential/Platelet (Completed)       Follow up plan: Return if symptoms worsen or fail to improve.

## 2016-11-05 NOTE — Telephone Encounter (Signed)
Please call and let her know her CBC came back mostly normal, just evidence of a viral infection going on as we thought with the stomach virus. Call if no better or worsening. Imodium and phenergan prn

## 2016-11-07 NOTE — Patient Instructions (Signed)
Follow up if no improvement 

## 2016-11-08 NOTE — Telephone Encounter (Signed)
Message relayed to patient. Verbalized understanding and denied questions. Pt is feeling much better.  

## 2016-11-08 NOTE — Telephone Encounter (Signed)
Left message on machine for pt to return call to the office.  

## 2016-11-08 NOTE — Telephone Encounter (Signed)
Patient returning call for CMA to get lab results. Informed pt. She will receive all back.

## 2016-11-15 ENCOUNTER — Other Ambulatory Visit: Payer: Self-pay | Admitting: Family Medicine

## 2017-01-10 ENCOUNTER — Encounter: Payer: Medicare Other | Admitting: Family Medicine

## 2017-01-19 ENCOUNTER — Encounter: Payer: Self-pay | Admitting: Family Medicine

## 2017-01-19 ENCOUNTER — Ambulatory Visit: Payer: Medicare Other

## 2017-01-19 ENCOUNTER — Ambulatory Visit (INDEPENDENT_AMBULATORY_CARE_PROVIDER_SITE_OTHER): Payer: Medicare Other | Admitting: Family Medicine

## 2017-01-19 VITALS — BP 137/86 | HR 89 | Wt 208.2 lb

## 2017-01-19 DIAGNOSIS — Z0000 Encounter for general adult medical examination without abnormal findings: Secondary | ICD-10-CM

## 2017-01-19 DIAGNOSIS — I10 Essential (primary) hypertension: Secondary | ICD-10-CM | POA: Diagnosis not present

## 2017-01-19 DIAGNOSIS — J301 Allergic rhinitis due to pollen: Secondary | ICD-10-CM

## 2017-01-19 DIAGNOSIS — F324 Major depressive disorder, single episode, in partial remission: Secondary | ICD-10-CM | POA: Diagnosis not present

## 2017-01-19 DIAGNOSIS — N3281 Overactive bladder: Secondary | ICD-10-CM | POA: Diagnosis not present

## 2017-01-19 DIAGNOSIS — H9192 Unspecified hearing loss, left ear: Secondary | ICD-10-CM

## 2017-01-19 DIAGNOSIS — E782 Mixed hyperlipidemia: Secondary | ICD-10-CM | POA: Diagnosis not present

## 2017-01-19 DIAGNOSIS — Z0001 Encounter for general adult medical examination with abnormal findings: Secondary | ICD-10-CM

## 2017-01-19 DIAGNOSIS — Z1239 Encounter for other screening for malignant neoplasm of breast: Secondary | ICD-10-CM

## 2017-01-19 DIAGNOSIS — R7301 Impaired fasting glucose: Secondary | ICD-10-CM

## 2017-01-19 DIAGNOSIS — E559 Vitamin D deficiency, unspecified: Secondary | ICD-10-CM

## 2017-01-19 LAB — UA/M W/RFLX CULTURE, ROUTINE
Bilirubin, UA: NEGATIVE
GLUCOSE, UA: NEGATIVE
KETONES UA: NEGATIVE
LEUKOCYTES UA: NEGATIVE
Nitrite, UA: NEGATIVE
Protein, UA: NEGATIVE
RBC, UA: NEGATIVE
SPEC GRAV UA: 1.015 (ref 1.005–1.030)
Urobilinogen, Ur: 0.2 mg/dL (ref 0.2–1.0)
pH, UA: 8.5 — ABNORMAL HIGH (ref 5.0–7.5)

## 2017-01-19 LAB — MICROALBUMIN, URINE WAIVED
CREATININE, URINE WAIVED: 100 mg/dL (ref 10–300)
Microalb, Ur Waived: 10 mg/L (ref 0–19)

## 2017-01-19 LAB — BAYER DCA HB A1C WAIVED: HB A1C: 5.4 % (ref <7.0)

## 2017-01-19 MED ORDER — ALBUTEROL SULFATE HFA 108 (90 BASE) MCG/ACT IN AERS: 2.0000 | INHALATION_SPRAY | Freq: Four times a day (QID) | RESPIRATORY_TRACT | 0 refills | Status: DC | PRN

## 2017-01-19 MED ORDER — BUPROPION HCL ER (XL) 300 MG PO TB24: ORAL_TABLET | ORAL | 1 refills | Status: DC

## 2017-01-19 MED ORDER — NAPROXEN 500 MG PO TABS: 500.0000 mg | ORAL_TABLET | Freq: Two times a day (BID) | ORAL | 1 refills | Status: DC

## 2017-01-19 MED ORDER — BUSPIRONE HCL 5 MG PO TABS: 5.0000 mg | ORAL_TABLET | Freq: Three times a day (TID) | ORAL | 6 refills | Status: DC

## 2017-01-19 MED ORDER — HYDROCHLOROTHIAZIDE 25 MG PO TABS
25.0000 mg | ORAL_TABLET | Freq: Every day | ORAL | 1 refills | Status: DC
Start: 2017-01-19 — End: 2017-07-14

## 2017-01-19 MED ORDER — VENLAFAXINE HCL ER 150 MG PO CP24: 150.0000 mg | ORAL_CAPSULE | Freq: Every day | ORAL | 1 refills | Status: DC

## 2017-01-19 MED ORDER — HYDROXYZINE HCL 25 MG PO TABS: 25.0000 mg | ORAL_TABLET | Freq: Three times a day (TID) | ORAL | 6 refills | Status: DC | PRN

## 2017-01-19 NOTE — Assessment & Plan Note (Signed)
Under good control. Continue current regimen. Continue to monitor. Call with any concerns. 

## 2017-01-19 NOTE — Patient Instructions (Addendum)
Preventative Services:  Health Risk Assessment and Personalized Prevention Plan: Done today Bone Mass Measurements: up to date Breast Cancer Screening: Due in March- ordered today CVD Screening: Done today Cervical Cancer Screening: N/A Colon Cancer Screening: up to date Depression Screening: Done today Diabetes Screening: Done today Glaucoma Screening: See your eye doctor Hepatitis B vaccine: N/A Hepatitis C screening: Up to date HIV Screening: Up to date Flu Vaccine: Up to date Lung cancer Screening: N/A Obesity Screening: Done today Pneumonia Vaccines (2): Up to date STI Screening: N/A  Health Maintenance, Female Adopting a healthy lifestyle and getting preventive care can go a long way to promote health and wellness. Talk with your health care provider about what schedule of regular examinations is right for you. This is a good chance for you to check in with your provider about disease prevention and staying healthy. In between checkups, there are plenty of things you can do on your own. Experts have done a lot of research about which lifestyle changes and preventive measures are most likely to keep you healthy. Ask your health care provider for more information. Weight and diet Eat a healthy diet  Be sure to include plenty of vegetables, fruits, low-fat dairy products, and lean protein.  Do not eat a lot of foods high in solid fats, added sugars, or salt.  Get regular exercise. This is one of the most important things you can do for your health. ? Most adults should exercise for at least 150 minutes each week. The exercise should increase your heart rate and make you sweat (moderate-intensity exercise). ? Most adults should also do strengthening exercises at least twice a week. This is in addition to the moderate-intensity exercise.  Maintain a healthy weight  Body mass index (BMI) is a measurement that can be used to identify possible weight problems. It estimates body fat  based on height and weight. Your health care provider can help determine your BMI and help you achieve or maintain a healthy weight.  For females 41 years of age and older: ? A BMI below 18.5 is considered underweight. ? A BMI of 18.5 to 24.9 is normal. ? A BMI of 25 to 29.9 is considered overweight. ? A BMI of 30 and above is considered obese.  Watch levels of cholesterol and blood lipids  You should start having your blood tested for lipids and cholesterol at 67 years of age, then have this test every 5 years.  You may need to have your cholesterol levels checked more often if: ? Your lipid or cholesterol levels are high. ? You are older than 67 years of age. ? You are at high risk for heart disease.  Cancer screening Lung Cancer  Lung cancer screening is recommended for adults 74-40 years old who are at high risk for lung cancer because of a history of smoking.  A yearly low-dose CT scan of the lungs is recommended for people who: ? Currently smoke. ? Have quit within the past 15 years. ? Have at least a 30-pack-year history of smoking. A pack year is smoking an average of one pack of cigarettes a day for 1 year.  Yearly screening should continue until it has been 15 years since you quit.  Yearly screening should stop if you develop a health problem that would prevent you from having lung cancer treatment.  Breast Cancer  Practice breast self-awareness. This means understanding how your breasts normally appear and feel.  It also means doing regular breast self-exams.  Let your health care provider know about any changes, no matter how small.  If you are in your 20s or 30s, you should have a clinical breast exam (CBE) by a health care provider every 1-3 years as part of a regular health exam.  If you are 57 or older, have a CBE every year. Also consider having a breast X-ray (mammogram) every year.  If you have a family history of breast cancer, talk to your health care  provider about genetic screening.  If you are at high risk for breast cancer, talk to your health care provider about having an MRI and a mammogram every year.  Breast cancer gene (BRCA) assessment is recommended for women who have family members with BRCA-related cancers. BRCA-related cancers include: ? Breast. ? Ovarian. ? Tubal. ? Peritoneal cancers.  Results of the assessment will determine the need for genetic counseling and BRCA1 and BRCA2 testing.  Cervical Cancer Your health care provider may recommend that you be screened regularly for cancer of the pelvic organs (ovaries, uterus, and vagina). This screening involves a pelvic examination, including checking for microscopic changes to the surface of your cervix (Pap test). You may be encouraged to have this screening done every 3 years, beginning at age 78.  For women ages 32-65, health care providers may recommend pelvic exams and Pap testing every 3 years, or they may recommend the Pap and pelvic exam, combined with testing for human papilloma virus (HPV), every 5 years. Some types of HPV increase your risk of cervical cancer. Testing for HPV may also be done on women of any age with unclear Pap test results.  Other health care providers may not recommend any screening for nonpregnant women who are considered low risk for pelvic cancer and who do not have symptoms. Ask your health care provider if a screening pelvic exam is right for you.  If you have had past treatment for cervical cancer or a condition that could lead to cancer, you need Pap tests and screening for cancer for at least 20 years after your treatment. If Pap tests have been discontinued, your risk factors (such as having a new sexual partner) need to be reassessed to determine if screening should resume. Some women have medical problems that increase the chance of getting cervical cancer. In these cases, your health care provider may recommend more frequent screening and  Pap tests.  Colorectal Cancer  This type of cancer can be detected and often prevented.  Routine colorectal cancer screening usually begins at 67 years of age and continues through 67 years of age.  Your health care provider may recommend screening at an earlier age if you have risk factors for colon cancer.  Your health care provider may also recommend using home test kits to check for hidden blood in the stool.  A small camera at the end of a tube can be used to examine your colon directly (sigmoidoscopy or colonoscopy). This is done to check for the earliest forms of colorectal cancer.  Routine screening usually begins at age 53.  Direct examination of the colon should be repeated every 5-10 years through 67 years of age. However, you may need to be screened more often if early forms of precancerous polyps or small growths are found.  Skin Cancer  Check your skin from head to toe regularly.  Tell your health care provider about any new moles or changes in moles, especially if there is a change in a mole's shape or color.  Also tell your health care provider if you have a mole that is larger than the size of a pencil eraser.  Always use sunscreen. Apply sunscreen liberally and repeatedly throughout the day.  Protect yourself by wearing long sleeves, pants, a wide-brimmed hat, and sunglasses whenever you are outside.  Heart disease, diabetes, and high blood pressure  High blood pressure causes heart disease and increases the risk of stroke. High blood pressure is more likely to develop in: ? People who have blood pressure in the high end of the normal range (130-139/85-89 mm Hg). ? People who are overweight or obese. ? People who are African American.  If you are 22-89 years of age, have your blood pressure checked every 3-5 years. If you are 37 years of age or older, have your blood pressure checked every year. You should have your blood pressure measured twice-once when you  are at a hospital or clinic, and once when you are not at a hospital or clinic. Record the average of the two measurements. To check your blood pressure when you are not at a hospital or clinic, you can use: ? An automated blood pressure machine at a pharmacy. ? A home blood pressure monitor.  If you are between 26 years and 34 years old, ask your health care provider if you should take aspirin to prevent strokes.  Have regular diabetes screenings. This involves taking a blood sample to check your fasting blood sugar level. ? If you are at a normal weight and have a low risk for diabetes, have this test once every three years after 67 years of age. ? If you are overweight and have a high risk for diabetes, consider being tested at a younger age or more often. Preventing infection Hepatitis B  If you have a higher risk for hepatitis B, you should be screened for this virus. You are considered at high risk for hepatitis B if: ? You were born in a country where hepatitis B is common. Ask your health care provider which countries are considered high risk. ? Your parents were born in a high-risk country, and you have not been immunized against hepatitis B (hepatitis B vaccine). ? You have HIV or AIDS. ? You use needles to inject street drugs. ? You live with someone who has hepatitis B. ? You have had sex with someone who has hepatitis B. ? You get hemodialysis treatment. ? You take certain medicines for conditions, including cancer, organ transplantation, and autoimmune conditions.  Hepatitis C  Blood testing is recommended for: ? Everyone born from [birth year] through 1966. ? Anyone with known risk factors for hepatitis C.  Sexually transmitted infections (STIs)  You should be screened for sexually transmitted infections (STIs) including gonorrhea and chlamydia if: ? You are sexually active and are younger than 67 years of age. ? You are older than 67 years of age and your health care provider  tells you that you are at risk for this type of infection. ? Your sexual activity has changed since you were last screened and you are at an increased risk for chlamydia or gonorrhea. Ask your health care provider if you are at risk.  If you do not have HIV, but are at risk, it may be recommended that you take a prescription medicine daily to prevent HIV infection. This is called pre-exposure prophylaxis (PrEP). You are considered at risk if: ? You are sexually active and do not regularly use condoms or know the HIV status of  your partner(s). ? You take drugs by injection. ? You are sexually active with a partner who has HIV.  Talk with your health care provider about whether you are at high risk of being infected with HIV. If you choose to begin PrEP, you should first be tested for HIV. You should then be tested every 3 months for as long as you are taking PrEP. Pregnancy  If you are premenopausal and you may become pregnant, ask your health care provider about preconception counseling.  If you may become pregnant, take 400 to 800 micrograms (mcg) of folic acid every day.  If you want to prevent pregnancy, talk to your health care provider about birth control (contraception). Osteoporosis and menopause  Osteoporosis is a disease in which the bones lose minerals and strength with aging. This can result in serious bone fractures. Your risk for osteoporosis can be identified using a bone density scan.  If you are 68 years of age or older, or if you are at risk for osteoporosis and fractures, ask your health care provider if you should be screened.  Ask your health care provider whether you should take a calcium or vitamin D supplement to lower your risk for osteoporosis.  Menopause may have certain physical symptoms and risks.  Hormone replacement therapy may reduce some of these symptoms and risks. Talk to your health care provider about whether hormone replacement therapy is right for  you. Follow these instructions at home:  Schedule regular health, dental, and eye exams.  Stay current with your immunizations.  Do not use any tobacco products including cigarettes, chewing tobacco, or electronic cigarettes.  If you are pregnant, do not drink alcohol.  If you are breastfeeding, limit how much and how often you drink alcohol.  Limit alcohol intake to no more than 1 drink per day for nonpregnant women. One drink equals 12 ounces of beer, 5 ounces of wine, or 1 ounces of hard liquor.  Do not use street drugs.  Do not share needles.  Ask your health care provider for help if you need support or information about quitting drugs.  Tell your health care provider if you often feel depressed.  Tell your health care provider if you have ever been abused or do not feel safe at home. This information is not intended to replace advice given to you by your health care provider. Make sure you discuss any questions you have with your health care provider. Document Released: 08/03/2010 Document Revised: 06/26/2015 Document Reviewed: 10/22/2014 Elsevier Interactive Patient Education  2018 [employer] Maintenance for Postmenopausal Women Menopause is a normal process in which your reproductive ability comes to an end. This process happens gradually over a span of months to years, usually between the ages of 46 and 70. Menopause is complete when you have missed 12 consecutive menstrual periods. It is important to talk with your health care provider about some of the most common conditions that affect postmenopausal women, such as heart disease, cancer, and bone loss (osteoporosis). Adopting a healthy lifestyle and getting preventive care can help to promote your health and wellness. Those actions can also lower your chances of developing some of these common conditions. What should I know about menopause? During menopause, you may experience a number of symptoms, such  as:  Moderate-to-severe hot flashes.  Night sweats.  Decrease in sex drive.  Mood swings.  Headaches.  Tiredness.  Irritability.  Memory problems.  Insomnia.  Choosing to treat or not to treat  menopausal changes is an individual decision that you make with your health care provider. What should I know about hormone replacement therapy and supplements? Hormone therapy products are effective for treating symptoms that are associated with menopause, such as hot flashes and night sweats. Hormone replacement carries certain risks, especially as you become older. If you are thinking about using estrogen or estrogen with progestin treatments, discuss the benefits and risks with your health care provider. What should I know about heart disease and stroke? Heart disease, heart attack, and stroke become more likely as you age. This may be due, in part, to the hormonal changes that your body experiences during menopause. These can affect how your body processes dietary fats, triglycerides, and cholesterol. Heart attack and stroke are both medical emergencies. There are many things that you can do to help prevent heart disease and stroke:  Have your blood pressure checked at least every 1-2 years. High blood pressure causes heart disease and increases the risk of stroke.  If you are 17-89 years old, ask your health care provider if you should take aspirin to prevent a heart attack or a stroke.  Do not use any tobacco products, including cigarettes, chewing tobacco, or electronic cigarettes. If you need help quitting, ask your health care provider.  It is important to eat a healthy diet and maintain a healthy weight. ? Be sure to include plenty of vegetables, fruits, low-fat dairy products, and lean protein. ? Avoid eating foods that are high in solid fats, added sugars, or salt (sodium).  Get regular exercise. This is one of the most important things that you can do for your health. ? Try  to exercise for at least 150 minutes each week. The type of exercise that you do should increase your heart rate and make you sweat. This is known as moderate-intensity exercise. ? Try to do strengthening exercises at least twice each week. Do these in addition to the moderate-intensity exercise.  Know your numbers.Ask your health care provider to check your cholesterol and your blood glucose. Continue to have your blood tested as directed by your health care provider.  What should I know about cancer screening? There are several types of cancer. Take the following steps to reduce your risk and to catch any cancer development as early as possible. Breast Cancer  Practice breast self-awareness. ? This means understanding how your breasts normally appear and feel. ? It also means doing regular breast self-exams. Let your health care provider know about any changes, no matter how small.  If you are 82 or older, have a clinician do a breast exam (clinical breast exam or CBE) every year. Depending on your age, family history, and medical history, it may be recommended that you also have a yearly breast X-ray (mammogram).  If you have a family history of breast cancer, talk with your health care provider about genetic screening.  If you are at high risk for breast cancer, talk with your health care provider about having an MRI and a mammogram every year.  Breast cancer (BRCA) gene test is recommended for women who have family members with BRCA-related cancers. Results of the assessment will determine the need for genetic counseling and BRCA1 and for BRCA2 testing. BRCA-related cancers include these types: ? Breast. This occurs in males or females. ? Ovarian. ? Tubal. This may also be called fallopian tube cancer. ? Cancer of the abdominal or pelvic lining (peritoneal cancer). ? Prostate. ? Pancreatic.  Cervical, Uterine, and Ovarian  Cancer Your health care provider may recommend that you be  screened regularly for cancer of the pelvic organs. These include your ovaries, uterus, and vagina. This screening involves a pelvic exam, which includes checking for microscopic changes to the surface of your cervix (Pap test).  For women ages 21-65, health care providers may recommend a pelvic exam and a Pap test every three years. For women ages 79-65, they may recommend the Pap test and pelvic exam, combined with testing for human papilloma virus (HPV), every five years. Some types of HPV increase your risk of cervical cancer. Testing for HPV may also be done on women of any age who have unclear Pap test results.  Other health care providers may not recommend any screening for nonpregnant women who are considered low risk for pelvic cancer and have no symptoms. Ask your health care provider if a screening pelvic exam is right for you.  If you have had past treatment for cervical cancer or a condition that could lead to cancer, you need Pap tests and screening for cancer for at least 20 years after your treatment. If Pap tests have been discontinued for you, your risk factors (such as having a new sexual partner) need to be reassessed to determine if you should start having screenings again. Some women have medical problems that increase the chance of getting cervical cancer. In these cases, your health care provider may recommend that you have screening and Pap tests more often.  If you have a family history of uterine cancer or ovarian cancer, talk with your health care provider about genetic screening.  If you have vaginal bleeding after reaching menopause, tell your health care provider.  There are currently no reliable tests available to screen for ovarian cancer.  Lung Cancer Lung cancer screening is recommended for adults 78-53 years old who are at high risk for lung cancer because of a history of smoking. A yearly low-dose CT scan of the lungs is recommended if you:  Currently  smoke.  Have a history of at least 30 pack-years of smoking and you currently smoke or have quit within the past 15 years. A pack-year is smoking an average of one pack of cigarettes per day for one year.  Yearly screening should:  Continue until it has been 15 years since you quit.  Stop if you develop a health problem that would prevent you from having lung cancer treatment.  Colorectal Cancer  This type of cancer can be detected and can often be prevented.  Routine colorectal cancer screening usually begins at age 61 and continues through age 79.  If you have risk factors for colon cancer, your health care provider may recommend that you be screened at an earlier age.  If you have a family history of colorectal cancer, talk with your health care provider about genetic screening.  Your health care provider may also recommend using home test kits to check for hidden blood in your stool.  A small camera at the end of a tube can be used to examine your colon directly (sigmoidoscopy or colonoscopy). This is done to check for the earliest forms of colorectal cancer.  Direct examination of the colon should be repeated every 5-10 years until age 66. However, if early forms of precancerous polyps or small growths are found or if you have a family history or genetic risk for colorectal cancer, you may need to be screened more often.  Skin Cancer  Check your skin from head to  toe regularly.  Monitor any moles. Be sure to tell your health care provider: ? About any new moles or changes in moles, especially if there is a change in a mole's shape or color. ? If you have a mole that is larger than the size of a pencil eraser.  If any of your family members has a history of skin cancer, especially at a young age, talk with your health care provider about genetic screening.  Always use sunscreen. Apply sunscreen liberally and repeatedly throughout the day.  Whenever you are outside, protect  yourself by wearing long sleeves, pants, a wide-brimmed hat, and sunglasses.  What should I know about osteoporosis? Osteoporosis is a condition in which bone destruction happens more quickly than new bone creation. After menopause, you may be at an increased risk for osteoporosis. To help prevent osteoporosis or the bone fractures that can happen because of osteoporosis, the following is recommended:  If you are 35-63 years old, get at least 1,000 mg of calcium and at least 600 mg of vitamin D per day.  If you are older than age 27 but younger than age 58, get at least 1,200 mg of calcium and at least 600 mg of vitamin D per day.  If you are older than age 24, get at least 1,200 mg of calcium and at least 800 mg of vitamin D per day.  Smoking and excessive alcohol intake increase the risk of osteoporosis. Eat foods that are rich in calcium and vitamin D, and do weight-bearing exercises several times each week as directed by your health care provider. What should I know about how menopause affects my mental health? Depression may occur at any age, but it is more common as you become older. Common symptoms of depression include:  Low or sad mood.  Changes in sleep patterns.  Changes in appetite or eating patterns.  Feeling an overall lack of motivation or enjoyment of activities that you previously enjoyed.  Frequent crying spells.  Talk with your health care provider if you think that you are experiencing depression. What should I know about immunizations? It is important that you get and maintain your immunizations. These include:  Tetanus, diphtheria, and pertussis (Tdap) booster vaccine.  Influenza every year before the flu season begins.  Pneumonia vaccine.  Shingles vaccine.  Your health care provider may also recommend other immunizations. This information is not intended to replace advice given to you by your health care provider. Make sure you discuss any questions you  have with your health care provider. Document Released: 03/12/2005 Document Revised: 08/08/2015 Document Reviewed: 10/22/2014 Elsevier Interactive Patient Education  2018 [employer].

## 2017-01-19 NOTE — Progress Notes (Signed)
BP 137/86 (BP Location: Left Arm, Patient Position: Sitting, Cuff Size: Normal)   Pulse 89   Wt 208 lb 4 oz (94.5 kg)   SpO2 98%   BMI 36.89 kg/m    Subjective:    Patient ID: [name] [name], female    DOB: 06/13/1950, 66 y.o.   MRN: [id number]  HPI: [name] [name] is a 66 y.o. female presenting on 01/19/2017 for comprehensive medical examination. Current medical complaints include:  HYPERTENSION / HYPERLIPIDEMIA Satisfied with current treatment? yes Duration of hypertension: chronic BP monitoring frequency: not checking BP medication side effects: no Past BP meds: hctz Duration of hyperlipidemia: chronic Cholesterol medication side effects: not on anything Cholesterol supplements: none Past cholesterol medications: none Medication compliance: excellent compliance Aspirin: no Recent stressors: no Recurrent headaches: no Visual changes: no Palpitations: no Dyspnea: no Chest pain: no Lower extremity edema: no Dizzy/lightheaded: no  Impaired Fasting Glucose HbA1C:  Lab Results  Component Value Date   HGBA1C 5.7 (H) 01/06/2015   Duration of elevated blood sugar: chronic Polydipsia: no Polyuria: no Weight change: no Visual disturbance: no Glucose Monitoring: no Diabetic Education: Not Completed Family history of diabetes: yes  DEPRESSION Mood status: controlled Satisfied with current treatment?: yes Symptom severity: mild  Duration of current treatment : chronic Side effects: no Medication compliance: excellent compliance Psychotherapy/counseling: no  Previous psychiatric medications: buspar, wellbutrin, hydroxyzine Depressed mood: yes Anxious mood: yes Anhedonia: no Significant weight loss or gain: no Insomnia: no  Fatigue: yes Feelings of worthlessness or guilt: no Impaired concentration/indecisiveness: no Suicidal ideations: no Hopelessness: no Crying spells: no Depression screen [facility] 2/9 01/19/2017 01/19/2017 02/03/2016 01/07/2016 08/13/2015    Decreased Interest 1 0 1 1 1   Down, Depressed, Hopeless 1 0 0 0 1  PHQ - 2 Score 2 0 1 1 2   Altered sleeping 1 - - - -  Tired, decreased energy 2 - - - -  Change in appetite 0 - - - -  Feeling bad or failure about yourself  1 - - - -  Trouble concentrating 1 - - - -  Moving slowly or fidgety/restless 1 - - - -  Suicidal thoughts 0 - - - -  PHQ-9 Score 8 - - - -  Difficult doing work/chores Not difficult at all - - - -   She currently lives with: husband Menopausal Symptoms: no  Functional Status Survey: Is the patient deaf or have difficulty hearing?: No Does the patient have difficulty seeing, even when wearing glasses/contacts?: No Does the patient have difficulty concentrating, remembering, or making decisions?: Yes Does the patient have difficulty walking or climbing stairs?: No Does the patient have difficulty dressing or bathing?: No Does the patient have difficulty doing errands alone such as visiting a doctor's office or shopping?: No  Fall Risk  01/19/2017 01/19/2017 02/03/2016 01/07/2016 01/06/2015  Falls in the past year? Yes No Yes No Yes  Number falls in past yr: 2 or more - 1 - 2 or more  Injury with Fall? Yes - No - No  Risk for fall due to : History of fall(s);Impaired balance/gait - - - -    Depression Screen Depression screen [facility] 2/9 01/19/2017 01/19/2017 02/03/2016 01/07/2016 08/13/2015  Decreased Interest 1 0 1 1 1   Down, Depressed, Hopeless 1 0 0 0 1  PHQ - 2 Score 2 0 1 1 2   Altered sleeping 1 - - - -  Tired, decreased energy 2 - - - -  Change in appetite 0 - - - -  Feeling bad or failure about yourself  1 - - - -  Trouble concentrating 1 - - - -  Moving slowly or fidgety/restless 1 - - - -  Suicidal thoughts 0 - - - -  PHQ-9 Score 8 - - - -  Difficult doing work/chores Not difficult at all - - - -   Advanced Directives Does patient have a HCPOA?    yes Does patient have a living will or MOST form?  yes  Past Medical History:  Past Medical History:   Diagnosis Date  . Hypertension partial    Surgical History:  Past Surgical History:  Procedure Laterality Date  . ABDOMINAL HYSTERECTOMY    . CARPAL TUNNEL RELEASE    . CESAREAN SECTION    . CHOLECYSTECTOMY    . HAMMER TOE SURGERY Bilateral     Medications:  Current Outpatient Medications on File Prior to Visit  Medication Sig  . ondansetron (ZOFRAN ODT) 4 MG disintegrating tablet Take 1 tablet (4 mg total) by mouth every 8 (eight) hours as needed. (Patient not taking: Reported on 01/19/2017)  . promethazine (PHENERGAN) 25 MG tablet Take 1 tablet (25 mg total) by mouth every 6 (six) hours as needed for nausea or vomiting. (Patient not taking: Reported on 01/19/2017)  . SUMAtriptan (IMITREX) 50 MG tablet Take 1 tablet (50 mg total) by mouth every 2 (two) hours as needed for migraine. May repeat in 2 hours if headache persists or recurs. (Patient not taking: Reported on 11/05/2016)   No current facility-administered medications on file prior to visit.     Allergies:  Allergies  Allergen Reactions  . Codeine Nausea And Vomiting  . Tussionex Pennkinetic Er [Hydrocod Polst-Cpm Polst Er] Nausea And Vomiting    Social History:  Social History   Socioeconomic History  . Marital status: Married    Spouse name: Not on file  . Number of children: Not on file  . Years of education: Not on file  . Highest education level: Not on file  Social Needs  . Financial resource strain: Not on file  . Food insecurity - worry: Not on file  . Food insecurity - inability: Not on file  . Transportation needs - medical: Not on file  . Transportation needs - non-medical: Not on file  Occupational History  . Not on file  Tobacco Use  . Smoking status: Never Smoker  . Smokeless tobacco: Never Used  Substance and Sexual Activity  . Alcohol use: No    Alcohol/week: 0.0 oz  . Drug use: No  . Sexual activity: Not Currently  Other Topics Concern  . Not on file  Social History Narrative   No  exercise   Social History   Tobacco Use  Smoking Status Never Smoker  Smokeless Tobacco Never Used   Social History   Substance and Sexual Activity  Alcohol Use No  . Alcohol/week: 0.0 oz    Family History:  Family History  Problem Relation Age of Onset  . Heart disease Mother   . Heart attack Father   . Hypertension Father   . Cancer Paternal Aunt   . Breast cancer Paternal Aunt     Past medical history, surgical history, medications, allergies, family history and social history reviewed with patient today and changes made to appropriate areas of the chart.   Review of Systems  Constitutional: Negative.   HENT: Negative.   Eyes: Negative.   Respiratory: Negative.   Cardiovascular: Negative.   Gastrointestinal: Positive for diarrhea and  heartburn. Negative for abdominal pain, blood in stool, constipation, melena, nausea and vomiting.  Genitourinary: Positive for frequency. Negative for dysuria, flank pain, hematuria and urgency.  Musculoskeletal: Negative.   Skin: Negative.   Neurological: Negative.   Endo/Heme/Allergies: Positive for polydipsia.  Psychiatric/Behavioral: Positive for depression and memory loss. Negative for hallucinations, substance abuse and suicidal ideas. The patient is nervous/anxious. The patient does not have insomnia.     All other ROS negative except what is listed above and in the HPI.      Objective:    BP 137/86 (BP Location: Left Arm, Patient Position: Sitting, Cuff Size: Normal)   Pulse 89   Wt 208 lb 4 oz (94.5 kg)   SpO2 98%   BMI 36.89 kg/m   Wt Readings from Last 3 Encounters:  01/19/17 208 lb 4 oz (94.5 kg)  11/05/16 202 lb (91.6 kg)  10/26/16 208 lb (94.3 kg)    Physical Exam  Constitutional: She is oriented to person, place, and time. She appears well-developed and well-nourished. No distress.  HENT:  Head: Normocephalic and atraumatic.  Right Ear: Hearing, tympanic membrane, external ear and ear canal normal.  Left  Ear: Hearing, tympanic membrane, external ear and ear canal normal.  Nose: Nose normal.  Mouth/Throat: Uvula is midline, oropharynx is clear and moist and mucous membranes are normal. No oropharyngeal exudate.  Eyes: Conjunctivae, EOM and lids are normal. Pupils are equal, round, and reactive to light. Right eye exhibits no discharge. Left eye exhibits no discharge. No scleral icterus.  Neck: Normal range of motion. Neck supple. No JVD present. No tracheal deviation present. No thyromegaly present.  Cardiovascular: Normal rate, regular rhythm, normal heart sounds and intact distal pulses. Exam reveals no gallop and no friction rub.  No murmur heard. Pulmonary/Chest: Effort normal and breath sounds normal. No stridor. No respiratory distress. She has no wheezes. She has no rales. She exhibits no tenderness.  Abdominal: Soft. Bowel sounds are normal. She exhibits no distension and no mass. There is no tenderness. There is no rebound and no guarding.  Musculoskeletal: Normal range of motion. She exhibits no edema, tenderness or deformity.  Lymphadenopathy:    She has no cervical adenopathy.  Neurological: She is alert and oriented to person, place, and time. She has normal reflexes. She displays normal reflexes. No cranial nerve deficit. She exhibits normal muscle tone. Coordination normal.  Skin: Skin is warm, dry and intact. No rash noted. She is not diaphoretic. No erythema. No pallor.  Psychiatric: She has a normal mood and affect. Her speech is normal and behavior is normal. Judgment and thought content normal. Cognition and memory are normal.  Nursing note and vitals reviewed.   6CIT Screen 01/19/2017 01/07/2016  What Year? 0 points 0 points  What month? 0 points 0 points  What time? 0 points 0 points  Count back from 20 0 points 0 points  Months in reverse 0 points 0 points  Repeat phrase 2 points 2 points  Total Score 2 2    Results for orders placed or performed in visit on 11/05/16    CBC With Differential/Platelet  Result Value Ref Range   WBC 10.6 3.4 - 10.8 x10E3/uL   RBC 5.49 (H) 3.77 - 5.28 x10E6/uL   Hemoglobin 15.4 11.1 - 15.9 g/dL   Hematocrit 16.1 (H) 09.6 - 46.6 %   MCV 89 79 - 97 fL   MCH 28.1 26.6 - 33.0 pg   MCHC 31.6 31.5 - 35.7 g/dL  RDW 13.5 12.3 - 15.4 %   Platelets 278 150 - 379 x10E3/uL   Neutrophils 78 Not Estab. %   Lymphs 18 Not Estab. %   MID 3 Not Estab. %   Neutrophils Absolute 8.3 (H) 1.4 - 7.0 x10E3/uL   Lymphocytes Absolute 1.9 0.7 - 3.1 x10E3/uL   MID (Absolute) 0.4 0.1 - 1.6 X10E3/uL      Assessment & Plan:   Problem List Items Addressed This Visit      Cardiovascular and Mediastinum   Hypertension    Under good control. Continue current regimen. Continue to monitor. Call with any concerns.       Relevant Medications   hydrochlorothiazide (HYDRODIURIL) 25 MG tablet   Other Relevant Orders   CBC with Differential/Platelet   Comprehensive metabolic panel   Microalbumin, Urine Waived   TSH   UA/M w/rflx Culture, Routine     Respiratory   Allergic rhinitis    Under good control. Continue current regimen. Continue to monitor. Call with any concerns.         Endocrine   IFG (impaired fasting glucose)    Under good control with A1c 5.4. Continue current regimen. Continue to monitor. Call with any concerns.       Relevant Orders   CBC with Differential/Platelet   Bayer DCA Hb A1c Waived   Comprehensive metabolic panel   Microalbumin, Urine Waived   UA/M w/rflx Culture, Routine     Nervous and Auditory   Hearing loss in left ear    Stable. Continue to monitor.         Genitourinary   Overactive bladder    Acting up a bit more. Discussed sending her to urology. She would like to wait at this time. Call with any concerns.       Relevant Orders   CBC with Differential/Platelet   Comprehensive metabolic panel   UA/M w/rflx Culture, Routine     Other   Depression    Under good control. Continue current  regimen. Continue to monitor. Call with any concerns.       Relevant Medications   venlafaxine XR (EFFEXOR-XR) 150 MG 24 hr capsule   hydrOXYzine (ATARAX/VISTARIL) 25 MG tablet   busPIRone (BUSPAR) 5 MG tablet   buPROPion (WELLBUTRIN XL) 300 MG 24 hr tablet   Other Relevant Orders   CBC with Differential/Platelet   Comprehensive metabolic panel   TSH   UA/M w/rflx Culture, Routine   Hyperlipidemia    Under good control. Continue current regimen. Continue to monitor. Call with any concerns.       Relevant Medications   hydrochlorothiazide (HYDRODIURIL) 25 MG tablet   Other Relevant Orders   CBC with Differential/Platelet   Comprehensive metabolic panel   Lipid Panel w/o Chol/HDL Ratio   UA/M w/rflx Culture, Routine   Vitamin D deficiency    Rechecking levels today. Await results.       Relevant Orders   CBC with Differential/Platelet   Comprehensive metabolic panel   UA/M w/rflx Culture, Routine   VITAMIN D 25 Hydroxy (Vit-D Deficiency, Fractures)    Other Visit Diagnoses    Medicare annual wellness visit, subsequent    -  Primary   Preventative care discussed today as below.    Routine general medical examination at a health care facility       Vaccines up to date. Screening labs checked today. Pap N/A. DEXA up to date. Mammogram ordered- due in March. Colon up to date. Continue diet and exercise.  Screening for breast cancer       Due in March- order in.    Relevant Orders   MM DIGITAL SCREENING BILATERAL       Preventative Services:  Health Risk Assessment and Personalized Prevention Plan: Done today Bone Mass Measurements: up to date Breast Cancer Screening: Due in March- ordered today CVD Screening: Done today Cervical Cancer Screening: N/A Colon Cancer Screening: up to date Depression Screening: Done today Diabetes Screening: Done today Glaucoma Screening: See your eye doctor Hepatitis B vaccine: N/A Hepatitis C screening: Up to date HIV Screening: Up  to date Flu Vaccine: Up to date Lung cancer Screening: N/A Obesity Screening: Done today Pneumonia Vaccines (2): Up to date STI Screening: N/A  Follow up plan: Return in about 6 months (around 07/20/2017) for Follow up BP/chol/depression.   LABORATORY TESTING:  - Pap smear: not applicable  IMMUNIZATIONS:   - Tdap: Tetanus vaccination status reviewed: last tetanus booster within 10 years. - Influenza: Up to date - Pneumovax: Up to date - Prevnar: Up to date - Zostavax vaccine: Up to date  SCREENING: -Mammogram: Ordered today  - Colonoscopy: Up to date  - Bone Density: Up to date   PATIENT COUNSELING:   Advised to take 1 mg of folate supplement per day if capable of pregnancy.   Sexuality: Discussed sexually transmitted diseases, partner selection, use of condoms, avoidance of unintended pregnancy  and contraceptive alternatives.   Advised to avoid cigarette smoking.  I discussed with the patient that most people either abstain from alcohol or drink within safe limits (<=14/week and <=4 drinks/occasion for males, <=7/weeks and <= 3 drinks/occasion for females) and that the risk for alcohol disorders and other health effects rises proportionally with the number of drinks per week and how often a drinker exceeds daily limits.  Discussed cessation/primary prevention of drug use and availability of treatment for abuse.   Diet: Encouraged to adjust caloric intake to maintain  or achieve ideal body weight, to reduce intake of dietary saturated fat and total fat, to limit sodium intake by avoiding high sodium foods and not adding table salt, and to maintain adequate dietary potassium and calcium preferably from fresh fruits, vegetables, and low-fat dairy products.    stressed the importance of regular exercise  Injury prevention: Discussed safety belts, safety helmets, smoke detector, smoking near bedding or upholstery.   Dental health: Discussed importance of regular tooth brushing,  flossing, and dental visits.    NEXT PREVENTATIVE PHYSICAL DUE IN 1 YEAR. Return in about 6 months (around 07/20/2017) for Follow up BP/chol/depression.

## 2017-01-19 NOTE — Assessment & Plan Note (Signed)
Under good control with A1c 5.4. Continue current regimen. Continue to monitor. Call with any concerns.

## 2017-01-19 NOTE — Assessment & Plan Note (Signed)
Stable.       - Continue to monitor

## 2017-01-19 NOTE — Assessment & Plan Note (Signed)
Acting up a bit more. Discussed sending her to urology. She would like to wait at this time. Call with any concerns.

## 2017-01-19 NOTE — Assessment & Plan Note (Signed)
Rechecking levels today. Await results.  

## 2017-01-20 ENCOUNTER — Encounter: Payer: Self-pay | Admitting: Family Medicine

## 2017-01-20 ENCOUNTER — Other Ambulatory Visit: Payer: Self-pay | Admitting: Family Medicine

## 2017-01-20 LAB — CBC WITH DIFFERENTIAL/PLATELET
BASOS ABS: 0 10*3/uL (ref 0.0–0.2)
BASOS: 0 %
EOS (ABSOLUTE): 0.2 10*3/uL (ref 0.0–0.4)
Eos: 2 %
HEMOGLOBIN: 14.4 g/dL (ref 11.1–15.9)
Hematocrit: 43.1 % (ref 34.0–46.6)
Immature Grans (Abs): 0 10*3/uL (ref 0.0–0.1)
Immature Granulocytes: 0 %
LYMPHS ABS: 1.6 10*3/uL (ref 0.7–3.1)
Lymphs: 22 %
MCH: 28.9 pg (ref 26.6–33.0)
MCHC: 33.4 g/dL (ref 31.5–35.7)
MCV: 87 fL (ref 79–97)
Monocytes Absolute: 0.6 10*3/uL (ref 0.1–0.9)
Monocytes: 9 %
NEUTROS ABS: 4.8 10*3/uL (ref 1.4–7.0)
Neutrophils: 67 %
PLATELETS: 238 10*3/uL (ref 150–379)
RBC: 4.98 x10E6/uL (ref 3.77–5.28)
RDW: 13.9 % (ref 12.3–15.4)
WBC: 7.2 10*3/uL (ref 3.4–10.8)

## 2017-01-20 LAB — COMPREHENSIVE METABOLIC PANEL
A/G RATIO: 2.4 — AB (ref 1.2–2.2)
ALBUMIN: 4.5 g/dL (ref 3.6–4.8)
ALK PHOS: 80 IU/L (ref 39–117)
ALT: 28 IU/L (ref 0–32)
AST: 28 IU/L (ref 0–40)
BILIRUBIN TOTAL: 0.4 mg/dL (ref 0.0–1.2)
BUN / CREAT RATIO: 18 (ref 12–28)
BUN: 14 mg/dL (ref 8–27)
CO2: 26 mmol/L (ref 20–29)
Calcium: 9.1 mg/dL (ref 8.7–10.3)
Chloride: 98 mmol/L (ref 96–106)
Creatinine, Ser: 0.8 mg/dL (ref 0.57–1.00)
GFR calc non Af Amer: 77 mL/min/{1.73_m2} (ref >59)
GFR, EST AFRICAN AMERICAN: 88 mL/min/{1.73_m2} (ref >59)
GLUCOSE: 91 mg/dL (ref 65–99)
Globulin, Total: 1.9 g/dL (ref 1.5–4.5)
Potassium: 4.1 mmol/L (ref 3.5–5.2)
SODIUM: 141 mmol/L (ref 134–144)
Total Protein: 6.4 g/dL (ref 6.0–8.5)

## 2017-01-20 LAB — LIPID PANEL W/O CHOL/HDL RATIO
Cholesterol, Total: 218 mg/dL — ABNORMAL HIGH (ref 100–199)
HDL: 41 mg/dL (ref >39)
LDL Calculated: 128 mg/dL — ABNORMAL HIGH (ref 0–99)
TRIGLYCERIDES: 244 mg/dL — AB (ref 0–149)
VLDL Cholesterol Cal: 49 mg/dL — ABNORMAL HIGH (ref 5–40)

## 2017-01-20 LAB — VITAMIN D 25 HYDROXY (VIT D DEFICIENCY, FRACTURES): VIT D 25 HYDROXY: 14.7 ng/mL — AB (ref 30.0–100.0)

## 2017-01-20 LAB — TSH: TSH: 0.968 u[IU]/mL (ref 0.450–4.500)

## 2017-01-20 MED ORDER — VITAMIN D (ERGOCALCIFEROL) 1.25 MG (50000 UNIT) PO CAPS: 50000.0000 [IU] | ORAL_CAPSULE | ORAL | 0 refills | Status: DC

## 2017-01-21 ENCOUNTER — Ambulatory Visit: Payer: Medicare Other

## 2017-04-11 ENCOUNTER — Other Ambulatory Visit: Payer: Self-pay | Admitting: Family Medicine

## 2017-04-25 ENCOUNTER — Encounter: Payer: Self-pay | Admitting: Family Medicine

## 2017-04-25 ENCOUNTER — Ambulatory Visit: Payer: Medicare Other | Admitting: Family Medicine

## 2017-04-25 DIAGNOSIS — I10 Essential (primary) hypertension: Secondary | ICD-10-CM | POA: Diagnosis not present

## 2017-04-25 DIAGNOSIS — R413 Other amnesia: Secondary | ICD-10-CM | POA: Diagnosis not present

## 2017-04-25 DIAGNOSIS — L304 Erythema intertrigo: Secondary | ICD-10-CM | POA: Diagnosis not present

## 2017-04-25 MED ORDER — CLOTRIMAZOLE-BETAMETHASONE 1-0.05 % EX CREA: 1.0000 "application " | TOPICAL_CREAM | Freq: Two times a day (BID) | CUTANEOUS | 0 refills | Status: DC

## 2017-04-25 NOTE — Assessment & Plan Note (Signed)
Discussed intertrigo care and treatment gave written directions because of memory loss will start with clotrimazole betamethasone cream for a week and then clotrimazole cream for a month skin drying Goldbond powder

## 2017-04-25 NOTE — Assessment & Plan Note (Signed)
The current medical regimen is effective;  continue present plan and medications.  

## 2017-04-25 NOTE — Progress Notes (Signed)
BP 136/86 (BP Location: Left Arm)   Pulse 80   Temp 98.1 F (36.7 C) (Oral)   Wt 212 lb (96.2 kg)   SpO2 96%   BMI 37.55 kg/m    Subjective:    Patient ID: [name] [name], female    DOB: 08/27/1950, 67 y.o.   MRN: [id number]  HPI: [name] [name] is a 67 y.o. female  Chief Complaint  Patient presents with  . Rash    pt states she has a rash in groin area on right side. States it does not itch, but it sort of painful   With marked redness irritation fat folds.  Noticed yesterday.  Otherwise doing well. Blood pressure otherwise doing well. Still having memory issues but functioning okay. Relevant past medical, surgical, family and social history reviewed and updated as indicated. Interim medical history since our last visit reviewed. Allergies and medications reviewed and updated.  Review of Systems  Constitutional: Negative.   Respiratory: Negative.   Cardiovascular: Negative.     Per HPI unless specifically indicated above     Objective:    BP 136/86 (BP Location: Left Arm)   Pulse 80   Temp 98.1 F (36.7 C) (Oral)   Wt 212 lb (96.2 kg)   SpO2 96%   BMI 37.55 kg/m   Wt Readings from Last 3 Encounters:  04/25/17 212 lb (96.2 kg)  01/19/17 208 lb 4 oz (94.5 kg)  11/05/16 202 lb (91.6 kg)    Physical Exam  Constitutional: She is oriented to person, place, and time. She appears well-developed and well-nourished.  HENT:  Head: Normocephalic and atraumatic.  Eyes: Conjunctivae and EOM are normal.  Neck: Normal range of motion.  Cardiovascular: Normal rate, regular rhythm and normal heart sounds.  Pulmonary/Chest: Effort normal and breath sounds normal.  Musculoskeletal: Normal range of motion.  Neurological: She is alert and oriented to person, place, and time.  Skin: No erythema.  Intertrigo changes waist  Psychiatric: She has a normal mood and affect. Her behavior is normal. Judgment and thought content normal.    Results for orders placed or performed  in visit on 01/19/17  CBC with Differential/Platelet  Result Value Ref Range   WBC 7.2 3.4 - 10.8 x10E3/uL   RBC 4.98 3.77 - 5.28 x10E6/uL   Hemoglobin 14.4 11.1 - 15.9 g/dL   Hematocrit 40.943.1 81.134.0 - 46.6 %   MCV 87 79 - 97 fL   MCH 28.9 26.6 - 33.0 pg   MCHC 33.4 31.5 - 35.7 g/dL   RDW 91.413.9 78.212.3 - 95.615.4 %   Platelets 238 150 - 379 x10E3/uL   Neutrophils 67 Not Estab. %   Lymphs 22 Not Estab. %   Monocytes 9 Not Estab. %   Eos 2 Not Estab. %   Basos 0 Not Estab. %   Neutrophils Absolute 4.8 1.4 - 7.0 x10E3/uL   Lymphocytes Absolute 1.6 0.7 - 3.1 x10E3/uL   Monocytes Absolute 0.6 0.1 - 0.9 x10E3/uL   EOS (ABSOLUTE) 0.2 0.0 - 0.4 x10E3/uL   Basophils Absolute 0.0 0.0 - 0.2 x10E3/uL   Immature Granulocytes 0 Not Estab. %   Immature Grans (Abs) 0.0 0.0 - 0.1 x10E3/uL  Bayer DCA Hb A1c Waived  Result Value Ref Range   Bayer DCA Hb A1c Waived 5.4 <7.0 %  Comprehensive metabolic panel  Result Value Ref Range   Glucose 91 65 - 99 mg/dL   BUN 14 8 - 27 mg/dL   Creatinine, Ser 2.130.80 0.57 -  1.00 mg/dL   GFR calc non Af Amer 77 >59 mL/min/1.73   GFR calc Af Amer 88 >59 mL/min/1.73   BUN/Creatinine Ratio 18 12 - 28   Sodium 141 134 - 144 mmol/L   Potassium 4.1 3.5 - 5.2 mmol/L   Chloride 98 96 - 106 mmol/L   CO2 26 20 - 29 mmol/L   Calcium 9.1 8.7 - 10.3 mg/dL   Total Protein 6.4 6.0 - 8.5 g/dL   Albumin 4.5 3.6 - 4.8 g/dL   Globulin, Total 1.9 1.5 - 4.5 g/dL   Albumin/Globulin Ratio 2.4 (H) 1.2 - 2.2   Bilirubin Total 0.4 0.0 - 1.2 mg/dL   Alkaline Phosphatase 80 39 - 117 IU/L   AST 28 0 - 40 IU/L   ALT 28 0 - 32 IU/L  Lipid Panel w/o Chol/HDL Ratio  Result Value Ref Range   Cholesterol, Total 218 (H) 100 - 199 mg/dL   Triglycerides 010 (H) 0 - 149 mg/dL   HDL 41 >27 mg/dL   VLDL Cholesterol Cal 49 (H) 5 - 40 mg/dL   LDL Calculated 253 (H) 0 - 99 mg/dL  Microalbumin, Urine Waived  Result Value Ref Range   Microalb, Ur Waived 10 0 - 19 mg/L   Creatinine, Urine Waived 100 10  - 300 mg/dL   Microalb/Creat Ratio <30 <30 mg/g  TSH  Result Value Ref Range   TSH 0.968 0.450 - 4.500 uIU/mL  UA/M w/rflx Culture, Routine  Result Value Ref Range   Specific Gravity, UA 1.015 1.005 - 1.030   pH, UA 8.5 (H) 5.0 - 7.5   Color, UA Yellow Yellow   Appearance Ur Hazy (A) Clear   Leukocytes, UA Negative Negative   Protein, UA Negative Negative/Trace   Glucose, UA Negative Negative   Ketones, UA Negative Negative   RBC, UA Negative Negative   Bilirubin, UA Negative Negative   Urobilinogen, Ur 0.2 0.2 - 1.0 mg/dL   Nitrite, UA Negative Negative  VITAMIN D 25 Hydroxy (Vit-D Deficiency, Fractures)  Result Value Ref Range   Vit D, 25-Hydroxy 14.7 (L) 30.0 - 100.0 ng/mL      Assessment & Plan:   Problem List Items Addressed This Visit      Cardiovascular and Mediastinum   Hypertension    The current medical regimen is effective;  continue present plan and medications.         Musculoskeletal and Integument   Intertrigo    Discussed intertrigo care and treatment gave written directions because of memory loss will start with clotrimazole betamethasone cream for a week and then clotrimazole cream for a month skin drying Goldbond powder        Other   Memory loss    Stable for now          Follow up plan: Return if symptoms worsen or fail to improve, for As scheduled.

## 2017-04-25 NOTE — Assessment & Plan Note (Signed)
Stable for now

## 2017-06-13 ENCOUNTER — Ambulatory Visit: Payer: Self-pay

## 2017-06-13 NOTE — Telephone Encounter (Signed)
Pt was mowing Saturday and develop red bumps with fluid like area on some of the bites. Pt thinks it was fire ants. She was edema but has noted the foot edema has improved. The bug bite are reddened and are more noticeable to the top of the foot. Care advice for home care given. Pt asked to call if the bites do not respond to the hydrocortisone or other home care advice. Please see care advice.  Reason for Disposition . Itchy insect bite  Answer Assessment - Initial Assessment Questions 1. TYPE of INSECT: "What type of insect was it?"      Red ant  2. ONSET: "When did you get bitten?"      Saturday 3. LOCATION: "Where is the insect bite located?"      Right foot and 1 spot near spot and between toes and a few bites on leg 4. REDNESS: "Is the area red or pink?" If so, ask "What size is area of redness?" (inches or cm). "When did the redness start?"     Yes front top of right foot-Saturday 5. PAIN: "Is there any pain?" If so, ask: "How bad is it?"  (Scale 1-10; or mild, moderate, severe)     no 6. ITCHING: "Does it itch?" If so, ask: "How bad is the itch?"    - MILD: doesn't interfere with normal activities   - MODERATE-SEVERE: interferes with work, school, sleep, or other activities      yesmild 7. SWELLING: "How big is the swelling?" (inches, cm, or compare to coins)     Whole foot is still swollen but not as bead as yesterday 8. OTHER SYMPTOMS: "Do you have any other symptoms?"  (e.g., difficulty breathing, hives)     no 9. PREGNANCY: "Is there any chance you are pregnant?" "When was your last menstrual period?"     n/a  Protocols used: INSECT BITE-A-AH

## 2017-06-30 ENCOUNTER — Encounter: Payer: Self-pay | Admitting: Family Medicine

## 2017-06-30 ENCOUNTER — Ambulatory Visit (INDEPENDENT_AMBULATORY_CARE_PROVIDER_SITE_OTHER): Payer: Medicare Other | Admitting: Family Medicine

## 2017-06-30 VITALS — BP 157/104 | HR 75 | Temp 97.7°F

## 2017-06-30 DIAGNOSIS — R197 Diarrhea, unspecified: Secondary | ICD-10-CM

## 2017-06-30 DIAGNOSIS — K529 Noninfective gastroenteritis and colitis, unspecified: Secondary | ICD-10-CM

## 2017-06-30 LAB — CBC WITH DIFFERENTIAL/PLATELET
HEMOGLOBIN: 15.2 g/dL
Hematocrit: 45.1 %
LYMPHS ABS: 1.5 10*3/uL
Lymphs: 17 %
MCH: 29.1 pg
MCHC: 33.7 g/dL
MCV: 86 fL
MID (Absolute): 0.3 10*3/uL
MID: 4 %
NEUTROS ABS: 6.6 10*3/uL
Neutrophils: 79 %
PLATELETS: 259 10*3/uL (ref 150–450)
RBC: 5.23 x10E6/uL
RDW: 13.2 %
WBC: 8.4 10*3/uL (ref 3.4–10.8)

## 2017-06-30 MED ORDER — ONDANSETRON 4 MG PO TBDP: 4.0000 mg | ORAL_TABLET | Freq: Three times a day (TID) | ORAL | 0 refills | Status: DC | PRN

## 2017-06-30 MED ORDER — PROMETHAZINE HCL 25 MG/ML IJ SOLN
25.0000 mg | Freq: Once | INTRAMUSCULAR | Status: AC
  Administered 2017-06-30: 25 mg via INTRAMUSCULAR

## 2017-06-30 NOTE — Progress Notes (Signed)
BP (!) 157/104   Pulse 75   Temp 97.7 F (36.5 C) (Oral)   SpO2 98%    Subjective:    Patient ID: [name] [name], female    DOB: [birth year], 67 y.o.   MRN: [id number]  HPI: [name] [name] is a 67 y.o. female  Chief Complaint  Patient presents with  . Nausea    Patient recently (Believes on Monday) returned from a 7 day bus trip across [place]. Toward the end pt did start to not feel well. Patient did report heat stroke a few years back, and did overheat on recent trip. Pt mind is foggy. She has slept x last 3 days  . Emesis  . Fatigue   ABDOMINAL ISSUES- just got back from a bus tour Duration: 4 days Ashby Dawes: feels bloated and not right Location: epigastric  Severity: no pain, just uncomfortable  Radiation: no Episode duration: constant Frequency: constant Alleviating factors: sleeping Aggravating factors: being up Treatments attempted: none Constipation: no Diarrhea: yes Episodes of diarrhea/day: 2-3x a day Mucous in the stool: no Heartburn: no Bloating:yes Flatulence: no Nausea: yes Vomiting: yes Episodes of vomit/day: 3-4x a day Melena or hematochezia: no Rash: no Jaundice: no Fever: no Weight loss: no  Relevant past medical, surgical, family and social history reviewed and updated as indicated. Interim medical history since our last visit reviewed. Allergies and medications reviewed and updated.  Review of Systems  Per HPI unless specifically indicated above     Objective:    BP (!) 157/104   Pulse 75   Temp 97.7 F (36.5 C) (Oral)   SpO2 98%   Wt Readings from Last 3 Encounters:  04/25/17 212 lb (96.2 kg)  01/19/17 208 lb 4 oz (94.5 kg)  11/05/16 202 lb (91.6 kg)    Physical Exam  Constitutional: She is oriented to person, place, and time. She appears well-developed and well-nourished. She appears ill. No distress.  HENT:  Head: Normocephalic and atraumatic.  Right Ear: Hearing normal.  Left Ear: Hearing normal.  Nose: Nose normal.  Eyes:  Conjunctivae and lids are normal. Right eye exhibits no discharge. Left eye exhibits no discharge. No scleral icterus.  Cardiovascular: Normal rate, regular rhythm, normal heart sounds and intact distal pulses. Exam reveals no gallop and no friction rub.  No murmur heard. Pulmonary/Chest: Effort normal and breath sounds normal. No stridor. No respiratory distress. She has no wheezes. She has no rales. She exhibits no tenderness.  Abdominal: Soft. Bowel sounds are normal. She exhibits no distension and no mass. There is no tenderness. There is no rebound and no guarding. No hernia.  Musculoskeletal: Normal range of motion.  Neurological: She is alert and oriented to person, place, and time.  Skin: Skin is warm, dry and intact. Capillary refill takes less than 2 seconds. No rash noted. She is not diaphoretic. No erythema. No pallor.  Psychiatric: She has a normal mood and affect. Her speech is normal and behavior is normal. Judgment and thought content normal. Cognition and memory are normal.  Nursing note and vitals reviewed.   Results for orders placed or performed in visit on 01/19/17  CBC with Differential/Platelet  Result Value Ref Range   WBC 7.2 3.4 - 10.8 x10E3/uL   RBC 4.98 3.77 - 5.28 x10E6/uL   Hemoglobin 14.4 11.1 - 15.9 g/dL   Hematocrit 40.9 81.1 - 46.6 %   MCV 87 79 - 97 fL   MCH 28.9 26.6 - 33.0 pg   MCHC 33.4 31.5 -  35.7 g/dL   RDW 16.1 09.6 - 04.5 %   Platelets 238 150 - 379 x10E3/uL   Neutrophils 67 Not Estab. %   Lymphs 22 Not Estab. %   Monocytes 9 Not Estab. %   Eos 2 Not Estab. %   Basos 0 Not Estab. %   Neutrophils Absolute 4.8 1.4 - 7.0 x10E3/uL   Lymphocytes Absolute 1.6 0.7 - 3.1 x10E3/uL   Monocytes Absolute 0.6 0.1 - 0.9 x10E3/uL   EOS (ABSOLUTE) 0.2 0.0 - 0.4 x10E3/uL   Basophils Absolute 0.0 0.0 - 0.2 x10E3/uL   Immature Granulocytes 0 Not Estab. %   Immature Grans (Abs) 0.0 0.0 - 0.1 x10E3/uL  Bayer DCA Hb A1c Waived  Result Value Ref Range   HB A1C  (BAYER DCA - WAIVED) 5.4 <7.0 %  Comprehensive metabolic panel  Result Value Ref Range   Glucose 91 65 - 99 mg/dL   BUN 14 8 - 27 mg/dL   Creatinine, Ser 4.09 0.57 - 1.00 mg/dL   GFR calc non Af Amer 77 >59 mL/min/1.73   GFR calc Af Amer 88 >59 mL/min/1.73   BUN/Creatinine Ratio 18 12 - 28   Sodium 141 134 - 144 mmol/L   Potassium 4.1 3.5 - 5.2 mmol/L   Chloride 98 96 - 106 mmol/L   CO2 26 20 - 29 mmol/L   Calcium 9.1 8.7 - 10.3 mg/dL   Total Protein 6.4 6.0 - 8.5 g/dL   Albumin 4.5 3.6 - 4.8 g/dL   Globulin, Total 1.9 1.5 - 4.5 g/dL   Albumin/Globulin Ratio 2.4 (H) 1.2 - 2.2   Bilirubin Total 0.4 0.0 - 1.2 mg/dL   Alkaline Phosphatase 80 39 - 117 IU/L   AST 28 0 - 40 IU/L   ALT 28 0 - 32 IU/L  Lipid Panel w/o Chol/HDL Ratio  Result Value Ref Range   Cholesterol, Total 218 (H) 100 - 199 mg/dL   Triglycerides 811 (H) 0 - 149 mg/dL   HDL 41 >91 mg/dL   VLDL Cholesterol Cal 49 (H) 5 - 40 mg/dL   LDL Calculated 478 (H) 0 - 99 mg/dL  Microalbumin, Urine Waived  Result Value Ref Range   Microalb, Ur Waived 10 0 - 19 mg/L   Creatinine, Urine Waived 100 10 - 300 mg/dL   Microalb/Creat Ratio <30 <30 mg/g  TSH  Result Value Ref Range   TSH 0.968 0.450 - 4.500 uIU/mL  UA/M w/rflx Culture, Routine  Result Value Ref Range   Specific Gravity, UA 1.015 1.005 - 1.030   pH, UA 8.5 (H) 5.0 - 7.5   Color, UA Yellow Yellow   Appearance Ur Hazy (A) Clear   Leukocytes, UA Negative Negative   Protein, UA Negative Negative/Trace   Glucose, UA Negative Negative   Ketones, UA Negative Negative   RBC, UA Negative Negative   Bilirubin, UA Negative Negative   Urobilinogen, Ur 0.2 0.2 - 1.0 mg/dL   Nitrite, UA Negative Negative  VITAMIN D 25 Hydroxy (Vit-D Deficiency, Fractures)  Result Value Ref Range   Vit D, 25-Hydroxy 14.7 (L) 30.0 - 100.0 ng/mL      Assessment & Plan:   Problem List Items Addressed This Visit    None    Visit Diagnoses    Gastroenteritis    -  Primary   Zofran  SL Rx given, Phenergan shot today. Call if not better by Monday. Checking labs. Await results.   Relevant Medications   promethazine (PHENERGAN) injection 25 mg  Diarrhea, unspecified type       WBC normal. Will check CMP and stool studies. Await results. Rest, gentle diet, fluids.    Relevant Orders   Comprehensive metabolic panel   CBC With Differential/Platelet   Ova and parasite examination   Fecal leukocytes   Stool C-Diff Toxin Assay   Stool Culture       Follow up plan: Return 2 weeks, for 6 month follow up and follow GI illness.

## 2017-07-01 ENCOUNTER — Telehealth: Payer: Self-pay | Admitting: Family Medicine

## 2017-07-01 ENCOUNTER — Other Ambulatory Visit: Payer: Self-pay | Admitting: Family Medicine

## 2017-07-01 LAB — COMPREHENSIVE METABOLIC PANEL
ALBUMIN: 4.5 g/dL (ref 3.6–4.8)
ALK PHOS: 79 IU/L (ref 39–117)
ALT: 57 IU/L — ABNORMAL HIGH (ref 0–32)
AST: 43 IU/L — AB (ref 0–40)
Albumin/Globulin Ratio: 2 (ref 1.2–2.2)
BUN / CREAT RATIO: 17 (ref 12–28)
BUN: 14 mg/dL (ref 8–27)
Bilirubin Total: 0.6 mg/dL (ref 0.0–1.2)
CO2: 22 mmol/L (ref 20–29)
CREATININE: 0.83 mg/dL (ref 0.57–1.00)
Calcium: 9.6 mg/dL (ref 8.7–10.3)
Chloride: 104 mmol/L (ref 96–106)
GFR calc Af Amer: 84 mL/min/{1.73_m2} (ref >59)
GFR calc non Af Amer: 73 mL/min/{1.73_m2} (ref >59)
GLUCOSE: 107 mg/dL — AB (ref 65–99)
Globulin, Total: 2.3 g/dL (ref 1.5–4.5)
Potassium: 3.7 mmol/L (ref 3.5–5.2)
Sodium: 143 mmol/L (ref 134–144)
Total Protein: 6.8 g/dL (ref 6.0–8.5)

## 2017-07-01 NOTE — Telephone Encounter (Signed)
Please let [name] [name] know that her labs came back normal. Her sugars look good!

## 2017-07-01 NOTE — Telephone Encounter (Signed)
Patient notified

## 2017-07-03 NOTE — Telephone Encounter (Signed)
error 

## 2017-07-04 ENCOUNTER — Other Ambulatory Visit: Payer: Self-pay | Admitting: Family Medicine

## 2017-07-04 NOTE — Telephone Encounter (Signed)
LOV 06/30/17  Dr. Laural BenesJohnson Last refill 04/11/17  # 12 with 0 refill

## 2017-07-14 ENCOUNTER — Emergency Department
Admission: EM | Admit: 2017-07-14 | Discharge: 2017-07-14 | Disposition: A | Discharge status: Home / Self Care | Payer: Medicare Other | Attending: Student in an Organized Health Care Education/Training Program | Admitting: Student in an Organized Health Care Education/Training Program

## 2017-07-14 ENCOUNTER — Encounter: Payer: Self-pay | Admitting: Intensive Care

## 2017-07-14 ENCOUNTER — Ambulatory Visit (INDEPENDENT_AMBULATORY_CARE_PROVIDER_SITE_OTHER): Payer: Medicare Other | Admitting: Family Medicine

## 2017-07-14 ENCOUNTER — Encounter: Payer: Self-pay | Admitting: Family Medicine

## 2017-07-14 ENCOUNTER — Emergency Department: Payer: Medicare Other

## 2017-07-14 ENCOUNTER — Other Ambulatory Visit: Payer: Self-pay | Admitting: Family Medicine

## 2017-07-14 ENCOUNTER — Telehealth: Payer: Self-pay | Admitting: Family Medicine

## 2017-07-14 VITALS — BP 155/88 | HR 65 | Temp 97.7°F | Wt 200.2 lb

## 2017-07-14 DIAGNOSIS — R7301 Impaired fasting glucose: Secondary | ICD-10-CM

## 2017-07-14 DIAGNOSIS — I10 Essential (primary) hypertension: Secondary | ICD-10-CM

## 2017-07-14 DIAGNOSIS — Z79899 Other long term (current) drug therapy: Secondary | ICD-10-CM | POA: Insufficient documentation

## 2017-07-14 DIAGNOSIS — E559 Vitamin D deficiency, unspecified: Secondary | ICD-10-CM | POA: Diagnosis not present

## 2017-07-14 DIAGNOSIS — Z9049 Acquired absence of other specified parts of digestive tract: Secondary | ICD-10-CM | POA: Diagnosis not present

## 2017-07-14 DIAGNOSIS — E782 Mixed hyperlipidemia: Secondary | ICD-10-CM

## 2017-07-14 DIAGNOSIS — R112 Nausea with vomiting, unspecified: Secondary | ICD-10-CM | POA: Diagnosis present

## 2017-07-14 DIAGNOSIS — E860 Dehydration: Secondary | ICD-10-CM | POA: Insufficient documentation

## 2017-07-14 DIAGNOSIS — K529 Noninfective gastroenteritis and colitis, unspecified: Secondary | ICD-10-CM

## 2017-07-14 DIAGNOSIS — F324 Major depressive disorder, single episode, in partial remission: Secondary | ICD-10-CM

## 2017-07-14 DIAGNOSIS — F329 Major depressive disorder, single episode, unspecified: Secondary | ICD-10-CM | POA: Insufficient documentation

## 2017-07-14 HISTORY — DX: Migraine, unspecified, not intractable, without status migrainosus: G43.909

## 2017-07-14 LAB — COMPREHENSIVE METABOLIC PANEL
ALK PHOS: 74 U/L (ref 38–126)
ALT: 69 U/L — ABNORMAL HIGH (ref 14–54)
ANION GAP: 14 (ref 5–15)
AST: 67 U/L — ABNORMAL HIGH (ref 15–41)
Albumin: 4.5 g/dL (ref 3.5–5.0)
BUN: 16 mg/dL (ref 6–20)
CALCIUM: 9.6 mg/dL (ref 8.9–10.3)
CO2: 21 mmol/L — AB (ref 22–32)
Chloride: 103 mmol/L (ref 101–111)
Creatinine, Ser: 0.71 mg/dL (ref 0.44–1.00)
GFR calc non Af Amer: >60 mL/min (ref >60)
Glucose, Bld: 106 mg/dL — ABNORMAL HIGH (ref 65–99)
POTASSIUM: 3.9 mmol/L (ref 3.5–5.1)
SODIUM: 138 mmol/L (ref 135–145)
Total Bilirubin: 1.2 mg/dL (ref 0.3–1.2)
Total Protein: 7.5 g/dL (ref 6.5–8.1)

## 2017-07-14 LAB — URINALYSIS, COMPLETE (UACMP) WITH MICROSCOPIC
BACTERIA UA: NONE SEEN
BILIRUBIN URINE: NEGATIVE
Glucose, UA: NEGATIVE mg/dL
HGB URINE DIPSTICK: NEGATIVE
Ketones, ur: 20 mg/dL — AB
Leukocytes, UA: NEGATIVE
NITRITE: NEGATIVE
Protein, ur: NEGATIVE mg/dL
Specific Gravity, Urine: 1.01 (ref 1.005–1.030)
pH: 6 (ref 5.0–8.0)

## 2017-07-14 LAB — CBC WITH DIFFERENTIAL/PLATELET
Hematocrit: 44.7 % (ref 34.0–46.6)
Hemoglobin: 15.2 g/dL (ref 11.1–15.9)
LYMPHS ABS: 1.6 10*3/uL (ref 0.7–3.1)
LYMPHS: 20 %
MCH: 29.5 pg (ref 26.6–33.0)
MCHC: 34 g/dL (ref 31.5–35.7)
MCV: 87 fL (ref 79–97)
MID (Absolute): 0.3 10*3/uL (ref 0.1–1.6)
MID: 4 %
NEUTROS ABS: 5.9 10*3/uL (ref 1.4–7.0)
Neutrophils: 76 %
PLATELETS: 238 10*3/uL (ref 150–450)
RBC: 5.15 x10E6/uL (ref 3.77–5.28)
RDW: 13.2 % (ref 12.3–15.4)
WBC: 7.8 10*3/uL (ref 3.4–10.8)

## 2017-07-14 LAB — CBC
HCT: 44.9 % (ref 35.0–47.0)
HEMOGLOBIN: 15.3 g/dL (ref 12.0–16.0)
MCH: 29.3 pg (ref 26.0–34.0)
MCHC: 34.1 g/dL (ref 32.0–36.0)
MCV: 85.8 fL (ref 80.0–100.0)
Platelets: 238 10*3/uL (ref 150–440)
RBC: 5.23 MIL/uL — AB (ref 3.80–5.20)
RDW: 13.1 % (ref 11.5–14.5)
WBC: 8.3 10*3/uL (ref 3.6–11.0)

## 2017-07-14 LAB — LIPASE, BLOOD: LIPASE: 26 U/L (ref 11–51)

## 2017-07-14 LAB — BAYER DCA HB A1C WAIVED: HB A1C: 5.5 % (ref <7.0)

## 2017-07-14 MED ORDER — HYDROXYZINE HCL 25 MG PO TABS: 25.0000 mg | ORAL_TABLET | Freq: Three times a day (TID) | ORAL | 6 refills | Status: DC | PRN

## 2017-07-14 MED ORDER — BUPROPION HCL ER (XL) 300 MG PO TB24: ORAL_TABLET | ORAL | 1 refills | Status: DC

## 2017-07-14 MED ORDER — METOCLOPRAMIDE HCL 5 MG/ML IJ SOLN
10.0000 mg | Freq: Once | INTRAMUSCULAR | Status: AC
  Administered 2017-07-14: 10 mg via INTRAVENOUS
  Filled 2017-07-14: qty 2

## 2017-07-14 MED ORDER — NAPROXEN 500 MG PO TABS: 500.0000 mg | ORAL_TABLET | Freq: Two times a day (BID) | ORAL | 1 refills | Status: DC

## 2017-07-14 MED ORDER — ONDANSETRON HCL 4 MG PO TABS: 4.0000 mg | ORAL_TABLET | Freq: Every day | ORAL | 0 refills | Status: DC | PRN

## 2017-07-14 MED ORDER — DIPHENHYDRAMINE HCL 50 MG/ML IJ SOLN
12.5000 mg | Freq: Once | INTRAMUSCULAR | Status: AC
Start: 2017-07-14 — End: 2017-07-14
  Administered 2017-07-14: 12.5 mg via INTRAVENOUS

## 2017-07-14 MED ORDER — SODIUM CHLORIDE 0.9 % IV BOLUS
1000.0000 mL | Freq: Once | INTRAVENOUS | Status: AC
  Administered 2017-07-14: 1000 mL via INTRAVENOUS

## 2017-07-14 MED ORDER — ALBUTEROL SULFATE HFA 108 (90 BASE) MCG/ACT IN AERS: 2.0000 | INHALATION_SPRAY | Freq: Four times a day (QID) | RESPIRATORY_TRACT | 6 refills | Status: DC | PRN

## 2017-07-14 MED ORDER — VENLAFAXINE HCL ER 150 MG PO CP24: 150.0000 mg | ORAL_CAPSULE | Freq: Every day | ORAL | 1 refills | Status: DC

## 2017-07-14 MED ORDER — HYDROCHLOROTHIAZIDE 25 MG PO TABS: 25.0000 mg | ORAL_TABLET | Freq: Every day | ORAL | 1 refills | Status: DC

## 2017-07-14 MED ORDER — BUSPIRONE HCL 5 MG PO TABS: 5.0000 mg | ORAL_TABLET | Freq: Three times a day (TID) | ORAL | 6 refills | Status: DC

## 2017-07-14 MED ORDER — DIPHENHYDRAMINE HCL 50 MG/ML IJ SOLN
INTRAMUSCULAR | Status: AC
  Filled 2017-07-14: qty 1

## 2017-07-14 NOTE — ED Notes (Signed)
Pt in xray

## 2017-07-14 NOTE — ED Notes (Signed)
Pt ambulatory to treatment room with family member. No distress noted.

## 2017-07-14 NOTE — ED Triage Notes (Signed)
Patient reports she has been vomiting/nausea for several days. A couple of weeks ago, patient went on a bus trip 19-26 of May to [place] and got sick while on the trip with emesis and diarrhea. Has been to see PCP Laural BenesJohnson every week since she got back from the trip. While at PCP today they told pt to come to ER for fluids from possible dehydration.

## 2017-07-14 NOTE — Assessment & Plan Note (Signed)
Rechecking levels today. Await results. Call with any concerns.  

## 2017-07-14 NOTE — Assessment & Plan Note (Signed)
Not fully discussed today as she is sick. Will refill her medicine and follow up on this more fully after her ER visit.

## 2017-07-14 NOTE — Assessment & Plan Note (Signed)
Stable. Continue diet and exercise. Call with any concerns.  

## 2017-07-14 NOTE — Progress Notes (Signed)
BP (!) 155/88 (BP Location: Left Arm, Patient Position: Sitting, Cuff Size: Normal)   Pulse 65   Temp 97.7 F (36.5 C)   Wt 200 lb 4 oz (90.8 kg)   SpO2 99%   BMI 35.47 kg/m    Subjective:    Patient ID: [name] [name], female    DOB: 01/16/1951, 67 y.o.   MRN: [id number]  HPI: [name] [name] is a 67 y.o. female  No chief complaint on file.  Has not stopped throwing up. Diarrhea has stopped. Did not call after last visit because her son had surgery. She has lost 12lbs since her last visit. Very fatigued. Trying to keep food down, but throwing up almost everything she eats.    HYPERTENSION / HYPERLIPIDEMIA Satisfied with current treatment? yes Duration of hypertension: chronic BP monitoring frequency: not checking BP medication side effects: no Past BP meds: hctz Duration of hyperlipidemia: chronic Cholesterol medication side effects: Not on anything Cholesterol supplements: none Medication compliance: good compliance Aspirin: no Recent stressors: yes Recurrent headaches: no Visual changes: no Palpitations: no Dyspnea: no Chest pain: no Lower extremity edema: no Dizzy/lightheaded: yes  Impaired Fasting Glucose HbA1C:  Lab Results  Component Value Date   HGBA1C 5.7 (H) 01/06/2015   Duration of elevated blood sugar:  Polydipsia: no Polyuria: no Weight change: yes Visual disturbance: no Glucose Monitoring: no   Diabetic Education: Not Completed Family history of diabetes: yes   Relevant past medical, surgical, family and social history reviewed and updated as indicated. Interim medical history since our last visit reviewed. Allergies and medications reviewed and updated.  Review of Systems  Constitutional: Positive for activity change, appetite change, chills, diaphoresis, fatigue and unexpected weight change. Negative for fever.  HENT: Negative.   Eyes: Negative.   Respiratory: Positive for choking and shortness of breath. Negative for apnea, cough, chest  tightness, wheezing and stridor.   Cardiovascular: Negative.   Gastrointestinal: Positive for abdominal pain, diarrhea, nausea and vomiting. Negative for abdominal distention, anal bleeding, blood in stool, constipation and rectal pain.  Endocrine: Negative.   Genitourinary: Negative.   Musculoskeletal: Negative.   Skin: Positive for pallor. Negative for color change, rash and wound.  Neurological: Negative.   Psychiatric/Behavioral: Negative.     Per HPI unless specifically indicated above     Objective:    BP (!) 155/88 (BP Location: Left Arm, Patient Position: Sitting, Cuff Size: Normal)   Pulse 65   Temp 97.7 F (36.5 C)   Wt 200 lb 4 oz (90.8 kg)   SpO2 99%   BMI 35.47 kg/m   Wt Readings from Last 3 Encounters:  07/14/17 200 lb 4 oz (90.8 kg)  04/25/17 212 lb (96.2 kg)  01/19/17 208 lb 4 oz (94.5 kg)    Physical Exam  Constitutional: She is oriented to person, place, and time. She appears well-developed and well-nourished. She appears ill. She appears distressed.  HENT:  Head: Normocephalic and atraumatic.  Right Ear: Hearing normal.  Left Ear: Hearing normal.  Nose: Nose normal.  Eyes: Conjunctivae and lids are normal. Right eye exhibits no discharge. Left eye exhibits no discharge. No scleral icterus.  Cardiovascular: Normal rate, regular rhythm, normal heart sounds and intact distal pulses. Exam reveals no gallop and no friction rub.  No murmur heard. Pulmonary/Chest: Effort normal and breath sounds normal. No stridor. No respiratory distress. She has no wheezes. She has no rales. She exhibits no tenderness.  Abdominal: Soft. She exhibits no distension and no mass. There  is tenderness. There is no rebound and no guarding.  Musculoskeletal: Normal range of motion.  Neurological: She is alert and oriented to person, place, and time.  Skin: Skin is warm and intact. No rash noted. She is not diaphoretic. No erythema. There is pallor.  Poor turgor, very dry appearing     Psychiatric: She has a normal mood and affect. Her speech is normal and behavior is normal. Judgment and thought content normal. Cognition and memory are normal.  Nursing note and vitals reviewed.   Results for orders placed or performed in visit on 06/30/17  Comprehensive metabolic panel  Result Value Ref Range   Glucose 107 (H) 65 - 99 mg/dL   BUN 14 8 - 27 mg/dL   Creatinine, Ser 1.61 0.57 - 1.00 mg/dL   GFR calc non Af Amer 73 >59 mL/min/1.73   GFR calc Af Amer 84 >59 mL/min/1.73   BUN/Creatinine Ratio 17 12 - 28   Sodium 143 134 - 144 mmol/L   Potassium 3.7 3.5 - 5.2 mmol/L   Chloride 104 96 - 106 mmol/L   CO2 22 20 - 29 mmol/L   Calcium 9.6 8.7 - 10.3 mg/dL   Total Protein 6.8 6.0 - 8.5 g/dL   Albumin 4.5 3.6 - 4.8 g/dL   Globulin, Total 2.3 1.5 - 4.5 g/dL   Albumin/Globulin Ratio 2.0 1.2 - 2.2   Bilirubin Total 0.6 0.0 - 1.2 mg/dL   Alkaline Phosphatase 79 39 - 117 IU/L   AST 43 (H) 0 - 40 IU/L   ALT 57 (H) 0 - 32 IU/L  CBC With Differential/Platelet  Result Value Ref Range   WBC 8.4 3.4 - 10.8 x10E3/uL   RBC 5.23 x10E6/uL   Hemoglobin 15.2 g/dL   Hematocrit 09.6 %   MCV 86 fL   MCH 29.1 pg   MCHC 33.7 g/dL   RDW 04.5 %   Platelets 259 150 - 450 x10E3/uL   Neutrophils 79 Not Estab. %   Lymphs 17 Not Estab. %   MID 4 Not Estab. %   Neutrophils Absolute 6.6 x10E3/uL   Lymphocytes Absolute 1.5 x10E3/uL   MID (Absolute) 0.3 X10E3/uL      Assessment & Plan:   Problem List Items Addressed This Visit      Cardiovascular and Mediastinum   Essential (primary) hypertension    Elevated today, but sick. Will refill her medicines and recheck after ER visit.       Relevant Medications   hydrochlorothiazide (HYDRODIURIL) 25 MG tablet   Other Relevant Orders   Comprehensive metabolic panel     Endocrine   Impaired fasting glucose    Stable. Continue diet and exercise. Call with any concerns.       Relevant Orders   Bayer DCA Hb A1c Waived   Comprehensive  metabolic panel     Other   Depression    Not fully discussed today as she is sick. Will refill her medicine and follow up on this more fully after her ER visit.       Relevant Medications   venlafaxine XR (EFFEXOR-XR) 150 MG 24 hr capsule   hydrOXYzine (ATARAX/VISTARIL) 25 MG tablet   busPIRone (BUSPAR) 5 MG tablet   buPROPion (WELLBUTRIN XL) 300 MG 24 hr tablet   Other Relevant Orders   Comprehensive metabolic panel   Hyperlipidemia   Relevant Medications   hydrochlorothiazide (HYDRODIURIL) 25 MG tablet   Other Relevant Orders   Comprehensive metabolic panel   Lipid Panel w/o Chol/HDL  Ratio   Vitamin D deficiency    Rechecking levels today. Await results. Call with any concerns.       Relevant Orders   Comprehensive metabolic panel   VITAMIN D 25 Hydroxy (Vit-D Deficiency, Fractures)    Other Visit Diagnoses    Gastroenteritis    -  Primary   Still throwing up. Very sick appearing today. Dehydrated. Has lost 12lbs. Will get her to the ER for further evaluation and fluids. To go there now. Triage call   Relevant Orders   CBC With Differential/Platelet       Follow up plan: Return After ER visit.

## 2017-07-14 NOTE — ED Notes (Signed)
Pt became very anxious after compazine, will not sit still or stay in bed. Informed to stay in bed. Adjusted bed for better position. Informed to take slow deep breaths. Pt following directions. Still very anxious.

## 2017-07-14 NOTE — ED Notes (Signed)
Patient whining, attempting to get out of bed. When asked what is wrong patient states "I'm claustrophobic". Patient repositioned in bed. Reoriented. Door left open to help with patients claustrophobia and for patient safety. MD notified and aware.

## 2017-07-14 NOTE — Telephone Encounter (Signed)
Husband came in patient was discharged from the ER. He notes that she is really having a lot of issues with her memory- this is something that runs in her family and he doesn't feel like it's necessary for her to see neurology because he doesn't think it would help. He is concerned that he doesn't know what medicines she's on. List given today. We will keep him updated on her changes in medication. Will see her again in 1-2 weeks.

## 2017-07-14 NOTE — Assessment & Plan Note (Signed)
Elevated today, but sick. Will refill her medicines and recheck after ER visit.

## 2017-07-14 NOTE — ED Provider Notes (Signed)
[facility] Emergency Department Provider Note    First MD Initiated Contact with Patient 07/14/17 1148     (approximate)  I have reviewed the triage vital signs and the nursing notes.   HISTORY  Chief Complaint Dehydration    HPI [name] [name] is a 67 y.o. female with several days of persistent nausea vomiting some loose stool.  Patient was recently traveling in [place] at the end of May last 2 days that trip is when she first got sick.  She followed up with her PCP and was found to be concern for dehydration.  Was given a shot for antinausea medication given prescription for antiemetics but patient did not get this picked up.  She denies any fevers.  No abdominal pain.  States that symptoms will improve and she thinks that things have gotten better and then they will get worse.  States that she has had roughly 10 to 12 pound weight loss in the past several weeks.  Patient saw her PCP today who sent her to the ER due to concern for dehydration.    Past Medical History:  Diagnosis Date  . Migraines    Family History  Problem Relation Age of Onset  . Heart disease Mother   . Heart attack Father   . Hypertension Father   . Cancer Paternal Aunt   . Breast cancer Paternal Aunt    Past Surgical History:  Procedure Laterality Date  . ABDOMINAL HYSTERECTOMY    . CARPAL TUNNEL RELEASE    . CESAREAN SECTION    . CHOLECYSTECTOMY    . HAMMER TOE SURGERY Bilateral    Patient Active Problem List   Diagnosis Date Noted  . Intertrigo 04/25/2017  . OSA (obstructive sleep apnea) 05/26/2015  . Atrophic vaginitis 01/13/2015  . Memory loss 01/13/2015  . Impaired fasting glucose 01/08/2015  . Vitamin D deficiency 01/08/2015  . Hearing loss of left ear 01/06/2015  . History of heat stroke 07/29/2014  . Other specified personal risk factors, not elsewhere classified 07/29/2014  . Depression 07/16/2014  . Essential (primary) hypertension 07/16/2014  .  Hyperlipidemia 07/16/2014  . Allergic rhinitis 07/16/2014  . Overactive bladder 07/16/2014  . Osteoporosis 07/16/2014  . Age-related osteoporosis without current pathological fracture 07/16/2014      Prior to Admission medications   Medication Sig Start Date End Date Taking? Authorizing Provider  albuterol (PROVENTIL HFA;VENTOLIN HFA) 108 (90 Base) MCG/ACT inhaler Inhale 2 puffs into the lungs every 6 (six) hours as needed for wheezing or shortness of breath. 07/14/17   Johnson, [name], DO  buPROPion (WELLBUTRIN XL) 300 MG 24 hr tablet Take 1 tablet (300 mg total) by mouth daily. 07/14/17   Johnson, [name], DO  busPIRone (BUSPAR) 5 MG tablet Take 1 tablet (5 mg total) by mouth 3 (three) times daily. 07/14/17   Johnson, [name], DO  clotrimazole-betamethasone (LOTRISONE) cream Apply 1 application topically 2 (two) times daily. 04/25/17   Steele Sizerrissman, Mark A, MD  hydrochlorothiazide (HYDRODIURIL) 25 MG tablet Take 1 tablet (25 mg total) by mouth daily. 07/14/17   Johnson, [name], DO  hydrOXYzine (ATARAX/VISTARIL) 25 MG tablet Take 1 tablet (25 mg total) by mouth 3 (three) times daily as needed. 07/14/17   Johnson, [name], DO  naproxen (NAPROSYN) 500 MG tablet Take 1 tablet (500 mg total) by mouth 2 (two) times daily with a meal. 07/14/17   Johnson, [name], DO  ondansetron (ZOFRAN ODT) 4 MG disintegrating tablet Take 1  tablet (4 mg total) by mouth every 8 (eight) hours as needed. 06/30/17   Johnson, [name], DO  promethazine (PHENERGAN) 25 MG tablet Take 1 tablet (25 mg total) by mouth every 6 (six) hours as needed for nausea or vomiting. 11/05/16   Particia Nearing, PA-C  SUMAtriptan (IMITREX) 50 MG tablet Take 1 tablet (50 mg total) by mouth every 2 (two) hours as needed for migraine. May repeat in 2 hours if headache persists or recurs. 10/08/16   Particia Nearing, PA-C  venlafaxine XR (EFFEXOR-XR) 150 MG 24 hr capsule Take 1 capsule (150 mg total) by mouth at bedtime. 07/14/17   Olevia Perches P, DO  Vitamin D, Ergocalciferol, (DRISDOL) 50000 units CAPS capsule Take 1 capsule (50,000 Units total) by mouth every 7 (seven) days. 07/05/17   Olevia Perches P, DO    Allergies Codeine and Tussionex pennkinetic er [hydrocod polst-cpm polst er]    Social History Social History   Tobacco Use  . Smoking status: Never Smoker  . Smokeless tobacco: Never Used  Substance Use Topics  . Alcohol use: No    Alcohol/week: 0.0 oz  . Drug use: No    Review of Systems Patient denies headaches, rhinorrhea, blurry vision, numbness, shortness of breath, chest pain, edema, cough, abdominal pain, nausea, vomiting, diarrhea, dysuria, fevers, rashes or hallucinations unless otherwise stated above in HPI. ____________________________________________   PHYSICAL EXAM:  VITAL SIGNS: Vitals:   07/14/17 1111  BP: (!) 129/103  Pulse: 75  Resp: 18  Temp: 97.9 F (36.6 C)  SpO2: 98%    Constitutional: Alert and oriented.  Eyes: Conjunctivae are normal.  Head: Atraumatic. Nose: No congestion/rhinnorhea. Mouth/Throat: Mucous membranes are moist.   Neck: No stridor. Painless ROM.  Cardiovascular: Normal rate, regular rhythm. Grossly normal heart sounds.  Good peripheral circulation. Respiratory: Normal respiratory effort.  No retractions. Lungs CTAB. Gastrointestinal: Soft and nontender. No distention. No abdominal bruits. No CVA tenderness. Genitourinary:  Musculoskeletal: No lower extremity tenderness nor edema.  No joint effusions. Neurologic:  Normal speech and language. No gross focal neurologic deficits are appreciated. No facial droop Skin:  Skin is warm, dry and intact. No rash noted. Psychiatric: Mood and affect are normal. Speech and behavior are normal.  ____________________________________________   LABS (all labs ordered are listed, but only abnormal results are displayed)  Results for orders placed or performed during the hospital encounter of 07/14/17 (from the past 24  hour(s))  Lipase, blood     Status: None   Collection Time: 07/14/17 11:19 AM  Result Value Ref Range   Lipase 26 11 - 51 U/L  Comprehensive metabolic panel     Status: Abnormal   Collection Time: 07/14/17 11:19 AM  Result Value Ref Range   Sodium 138 135 - 145 mmol/L   Potassium 3.9 3.5 - 5.1 mmol/L   Chloride 103 101 - 111 mmol/L   CO2 21 (L) 22 - 32 mmol/L   Glucose, Bld 106 (H) 65 - 99 mg/dL   BUN 16 6 - 20 mg/dL   Creatinine, Ser 1.61 0.44 - 1.00 mg/dL   Calcium 9.6 8.9 - 09.6 mg/dL   Total Protein 7.5 6.5 - 8.1 g/dL   Albumin 4.5 3.5 - 5.0 g/dL   AST 67 (H) 15 - 41 U/L   ALT 69 (H) 14 - 54 U/L   Alkaline Phosphatase 74 38 - 126 U/L   Total Bilirubin 1.2 0.3 - 1.2 mg/dL   GFR calc non Af Amer >60 >60  mL/min   GFR calc Af Amer >60 >60 mL/min   Anion gap 14 5 - 15  CBC     Status: Abnormal   Collection Time: 07/14/17 11:19 AM  Result Value Ref Range   WBC 8.3 3.6 - 11.0 K/uL   RBC 5.23 (H) 3.80 - 5.20 MIL/uL   Hemoglobin 15.3 12.0 - 16.0 g/dL   HCT 16.1 09.6 - 04.5 %   MCV 85.8 80.0 - 100.0 fL   MCH 29.3 26.0 - 34.0 pg   MCHC 34.1 32.0 - 36.0 g/dL   RDW 40.9 81.1 - 91.4 %   Platelets 238 150 - 440 K/uL   ____________________________________________ ____________________________________________  RADIOLOGY  I personally reviewed all radiographic images ordered to evaluate for the above acute complaints and reviewed radiology reports and findings.  These findings were personally discussed with the patient.  Please see medical record for radiology report.  ____________________________________________   PROCEDURES  Procedure(s) performed:  Procedures    Critical Care performed: no ____________________________________________   INITIAL IMPRESSION / ASSESSMENT AND PLAN / ED COURSE  Pertinent labs & imaging results that were available during my care of the patient were reviewed by me and considered in my medical decision making (see chart for details).   DDX:  dehdyration, enteritis, pna, electrolyte abn, metabolic, abn  Gisele L Binns is a 67 y.o. who presents to the ED with symptoms as described above.  Patient nontoxic-appearing.  Patient will be given IV fluids due to concern for dehydration.  Renal function normal.  No evidence of infectious process at this time.  Patient unable to provide any stool sample therefore not consistent with C. difficile.  Her abdominal exam soft and benign.  Radiograph showed evidence of obstructive pattern free air.  No evidence of pneumonia.  Patient given IV fluids with improvement in symptoms.  Will discharge home with prescription for Zofran.      As part of my medical decision making, I reviewed the following data within the electronic MEDICAL RECORD NUMBER Nursing notes reviewed and incorporated, Labs reviewed, notes from prior ED visits.   ____________________________________________   FINAL CLINICAL IMPRESSION(S) / ED DIAGNOSES  Final diagnoses:  Dehydration      NEW MEDICATIONS STARTED DURING THIS VISIT:  New Prescriptions   No medications on file     Note:  This document was prepared using Dragon voice recognition software and may include unintentional dictation errors.    Willy Eddy, MD 07/14/17 1353

## 2017-07-15 LAB — COMPREHENSIVE METABOLIC PANEL
ALBUMIN: 4.6 g/dL (ref 3.6–4.8)
ALT: 67 IU/L — AB (ref 0–32)
AST: 55 IU/L — AB (ref 0–40)
Albumin/Globulin Ratio: 2 (ref 1.2–2.2)
Alkaline Phosphatase: 88 IU/L (ref 39–117)
BUN/Creatinine Ratio: 19 (ref 12–28)
BUN: 14 mg/dL (ref 8–27)
Bilirubin Total: 0.7 mg/dL (ref 0.0–1.2)
CO2: 21 mmol/L (ref 20–29)
CREATININE: 0.74 mg/dL (ref 0.57–1.00)
Calcium: 9.8 mg/dL (ref 8.7–10.3)
Chloride: 102 mmol/L (ref 96–106)
GFR calc Af Amer: 96 mL/min/{1.73_m2} (ref >59)
GFR, EST NON AFRICAN AMERICAN: 84 mL/min/{1.73_m2} (ref >59)
Globulin, Total: 2.3 g/dL (ref 1.5–4.5)
Glucose: 95 mg/dL (ref 65–99)
Potassium: 3.8 mmol/L (ref 3.5–5.2)
Sodium: 142 mmol/L (ref 134–144)
TOTAL PROTEIN: 6.9 g/dL (ref 6.0–8.5)

## 2017-07-15 LAB — VITAMIN D 25 HYDROXY (VIT D DEFICIENCY, FRACTURES): VIT D 25 HYDROXY: 23.8 ng/mL — AB (ref 30.0–100.0)

## 2017-07-15 LAB — LIPID PANEL W/O CHOL/HDL RATIO
CHOLESTEROL TOTAL: 231 mg/dL — AB (ref 100–199)
HDL: 40 mg/dL (ref >39)
LDL CALC: 164 mg/dL — AB (ref 0–99)
TRIGLYCERIDES: 133 mg/dL (ref 0–149)
VLDL CHOLESTEROL CAL: 27 mg/dL (ref 5–40)

## 2017-07-18 ENCOUNTER — Telehealth: Payer: Self-pay | Admitting: Family Medicine

## 2017-07-18 NOTE — Telephone Encounter (Signed)
Patient notified

## 2017-07-18 NOTE — Telephone Encounter (Signed)
Please let her know that her labs came back normal except her vitamin D is still low- so keep taking it. Thanks!

## 2017-07-25 ENCOUNTER — Ambulatory Visit: Payer: Medicare Other | Admitting: Family Medicine

## 2017-08-22 ENCOUNTER — Other Ambulatory Visit: Payer: Self-pay | Admitting: Family Medicine

## 2017-08-22 DIAGNOSIS — R413 Other amnesia: Secondary | ICD-10-CM

## 2017-08-22 NOTE — Progress Notes (Signed)
Discussion with her husband memory loss getting worse will make referral to neurology to further evaluate.

## 2017-08-26 ENCOUNTER — Ambulatory Visit (INDEPENDENT_AMBULATORY_CARE_PROVIDER_SITE_OTHER): Payer: Medicare Other | Admitting: Family Medicine

## 2017-08-26 ENCOUNTER — Encounter: Payer: Self-pay | Admitting: Family Medicine

## 2017-08-26 ENCOUNTER — Other Ambulatory Visit: Payer: Self-pay

## 2017-08-26 VITALS — BP 131/86 | HR 81 | Temp 98.0°F | Ht 64.0 in | Wt 202.6 lb

## 2017-08-26 DIAGNOSIS — F324 Major depressive disorder, single episode, in partial remission: Secondary | ICD-10-CM

## 2017-08-26 DIAGNOSIS — R413 Other amnesia: Secondary | ICD-10-CM

## 2017-08-26 DIAGNOSIS — K529 Noninfective gastroenteritis and colitis, unspecified: Secondary | ICD-10-CM

## 2017-08-26 MED ORDER — VENLAFAXINE HCL ER 150 MG PO CP24: 150.0000 mg | ORAL_CAPSULE | Freq: Every day | ORAL | 1 refills | Status: DC

## 2017-08-26 NOTE — Assessment & Plan Note (Signed)
Seeing Dr. Malvin JohnsPotter on 09/12/17. Call with any concerns. Continue to monitor.

## 2017-08-26 NOTE — Patient Instructions (Addendum)
[facility] Address: [address], [place], [place] [address]  Phone: [phone] Dr. Malvin JohnsPotter August 12, 2:45PM Memory Doctor

## 2017-08-26 NOTE — Assessment & Plan Note (Signed)
Not under good control. Will restart effexor and recheck 1 month. Call with any concerns.

## 2017-08-26 NOTE — Progress Notes (Signed)
BP 131/86   Pulse 81   Temp 98 F (36.7 C) (Oral)   Ht 5\' 4"  (1.626 m)   Wt 202 lb 9.6 oz (91.9 kg)   SpO2 96%   BMI 34.78 kg/m    Subjective:    Patient ID: [name] [name], female    DOB: [birth year], 67 y.o.   MRN: [id number]  HPI: [name] [name] is a 67 y.o. female  Chief Complaint  Patient presents with  . Hospitalization Follow-up  . Dehydration   ER FOLLOW UP Time since discharge: 5 weeks Hospital/facility: [facility] Diagnosis: dehydration Procedures/tests: Labs, CXR Consultants: None New medications: zofran Discharge instructions:  None Status: better   Has not been having any nausea, no vomiting, no diarrhea. No more dizziness.  Tired because she is caring for her husband who just had a bypass.  DEPRESSION- stopped her effexor. Mood has been worse.  Mood status: uncontrolled Satisfied with current treatment?: no Symptom severity: moderate  Duration of current treatment : chronic Side effects: no Medication compliance: excellent compliance Psychotherapy/counseling: no  Previous psychiatric medications: wellbutrin, effexor (not taking), buspar Depressed mood: yes Anxious mood: yes Anhedonia: no Significant weight loss or gain: no Insomnia: yes hard to fall asleep Fatigue: yes Feelings of worthlessness or guilt: yes Impaired concentration/indecisiveness: yes Suicidal ideations: no Hopelessness: yes Crying spells: yes Depression screen [facility] 2/9 08/26/2017 01/19/2017 01/19/2017 02/03/2016 01/07/2016  Decreased Interest 2 1 0 1 1  Down, Depressed, Hopeless 3 1 0 0 0  PHQ - 2 Score 5 2 0 1 1  Altered sleeping 2 1 - - -  Tired, decreased energy 1 2 - - -  Change in appetite 0 0 - - -  Feeling bad or failure about yourself  2 1 - - -  Trouble concentrating 2 1 - - -  Moving slowly or fidgety/restless 0 1 - - -  Suicidal thoughts 0 0 - - -  PHQ-9 Score 12 8 - - -  Difficult doing work/chores Very difficult Not difficult at all - - -    Relevant past medical,  surgical, family and social history reviewed and updated as indicated. Interim medical history since our last visit reviewed. Allergies and medications reviewed and updated.  Review of Systems  Constitutional: Negative.   Respiratory: Negative.   Cardiovascular: Negative.   Neurological: Negative.   Psychiatric/Behavioral: Positive for dysphoric mood and sleep disturbance. Negative for agitation, behavioral problems, confusion, decreased concentration, hallucinations, self-injury and suicidal ideas. The patient is nervous/anxious. The patient is not hyperactive.     Per HPI unless specifically indicated above     Objective:    BP 131/86   Pulse 81   Temp 98 F (36.7 C) (Oral)   Ht 5\' 4"  (1.626 m)   Wt 202 lb 9.6 oz (91.9 kg)   SpO2 96%   BMI 34.78 kg/m   Wt Readings from Last 3 Encounters:  08/26/17 202 lb 9.6 oz (91.9 kg)  07/14/17 200 lb (90.7 kg)  07/14/17 200 lb 4 oz (90.8 kg)    Physical Exam  Constitutional: She is oriented to person, place, and time. She appears well-developed and well-nourished. No distress.  HENT:  Head: Normocephalic and atraumatic.  Right Ear: Hearing normal.  Left Ear: Hearing normal.  Nose: Nose normal.  Eyes: Conjunctivae and lids are normal. Right eye exhibits no discharge. Left eye exhibits no discharge. No scleral icterus.  Cardiovascular: Normal rate, regular rhythm, normal heart sounds and intact distal pulses. Exam reveals no  gallop and no friction rub.  No murmur heard. Pulmonary/Chest: Effort normal and breath sounds normal. No stridor. No respiratory distress. She has no wheezes. She has no rales. She exhibits no tenderness.  Musculoskeletal: Normal range of motion.  Neurological: She is alert and oriented to person, place, and time.  Skin: Skin is warm, dry and intact. No rash noted. She is not diaphoretic. No erythema. No pallor.  Psychiatric: She has a normal mood and affect. Her speech is normal and behavior is normal. Judgment  and thought content normal. Cognition and memory are normal.  Nursing note and vitals reviewed.   Results for orders placed or performed during the hospital encounter of 07/14/17  Lipase, blood  Result Value Ref Range   Lipase 26 11 - 51 U/L  Comprehensive metabolic panel  Result Value Ref Range   Sodium 138 135 - 145 mmol/L   Potassium 3.9 3.5 - 5.1 mmol/L   Chloride 103 101 - 111 mmol/L   CO2 21 (L) 22 - 32 mmol/L   Glucose, Bld 106 (H) 65 - 99 mg/dL   BUN 16 6 - 20 mg/dL   Creatinine, Ser 1.610.71 0.44 - 1.00 mg/dL   Calcium 9.6 8.9 - 09.610.3 mg/dL   Total Protein 7.5 6.5 - 8.1 g/dL   Albumin 4.5 3.5 - 5.0 g/dL   AST 67 (H) 15 - 41 U/L   ALT 69 (H) 14 - 54 U/L   Alkaline Phosphatase 74 38 - 126 U/L   Total Bilirubin 1.2 0.3 - 1.2 mg/dL   GFR calc non Af Amer >60 >60 mL/min   GFR calc Af Amer >60 >60 mL/min   Anion gap 14 5 - 15  CBC  Result Value Ref Range   WBC 8.3 3.6 - 11.0 K/uL   RBC 5.23 (H) 3.80 - 5.20 MIL/uL   Hemoglobin 15.3 12.0 - 16.0 g/dL   HCT 04.544.9 40.935.0 - 81.147.0 %   MCV 85.8 80.0 - 100.0 fL   MCH 29.3 26.0 - 34.0 pg   MCHC 34.1 32.0 - 36.0 g/dL   RDW 91.413.1 78.211.5 - 95.614.5 %   Platelets 238 150 - 440 K/uL  Urinalysis, Complete w Microscopic  Result Value Ref Range   Color, Urine YELLOW (A) YELLOW   APPearance CLEAR (A) CLEAR   Specific Gravity, Urine 1.010 1.005 - 1.030   pH 6.0 5.0 - 8.0   Glucose, UA NEGATIVE NEGATIVE mg/dL   Hgb urine dipstick NEGATIVE NEGATIVE   Bilirubin Urine NEGATIVE NEGATIVE   Ketones, ur 20 (A) NEGATIVE mg/dL   Protein, ur NEGATIVE NEGATIVE mg/dL   Nitrite NEGATIVE NEGATIVE   Leukocytes, UA NEGATIVE NEGATIVE   WBC, UA 0-5 0 - 5 WBC/hpf   Bacteria, UA NONE SEEN NONE SEEN   Squamous Epithelial / LPF 0-5 0 - 5   Mucus PRESENT       Assessment & Plan:   Problem List Items Addressed This Visit      Other   Depression - Primary    Not under good control. Will restart effexor and recheck 1 month. Call with any concerns.        Relevant Medications   venlafaxine XR (EFFEXOR-XR) 150 MG 24 hr capsule   Memory loss    Seeing Dr. Malvin JohnsPotter on 09/12/17. Call with any concerns. Continue to monitor.        Other Visit Diagnoses    Gastroenteritis       Resolved. No more issues. Call with any concerns.  Follow up plan: Return in about 1 month (around 09/23/2017).

## 2017-09-20 IMAGING — DX DG RIBS 2V*R*
5 series · 5 of 5 positions shown · non-contrast
Comparison: 07/23/2014

CLINICAL DATA: Right rib pain.  Fall 6 days ago

EXAM:
RIGHT RIBS - 2 VIEW

[chest pa]
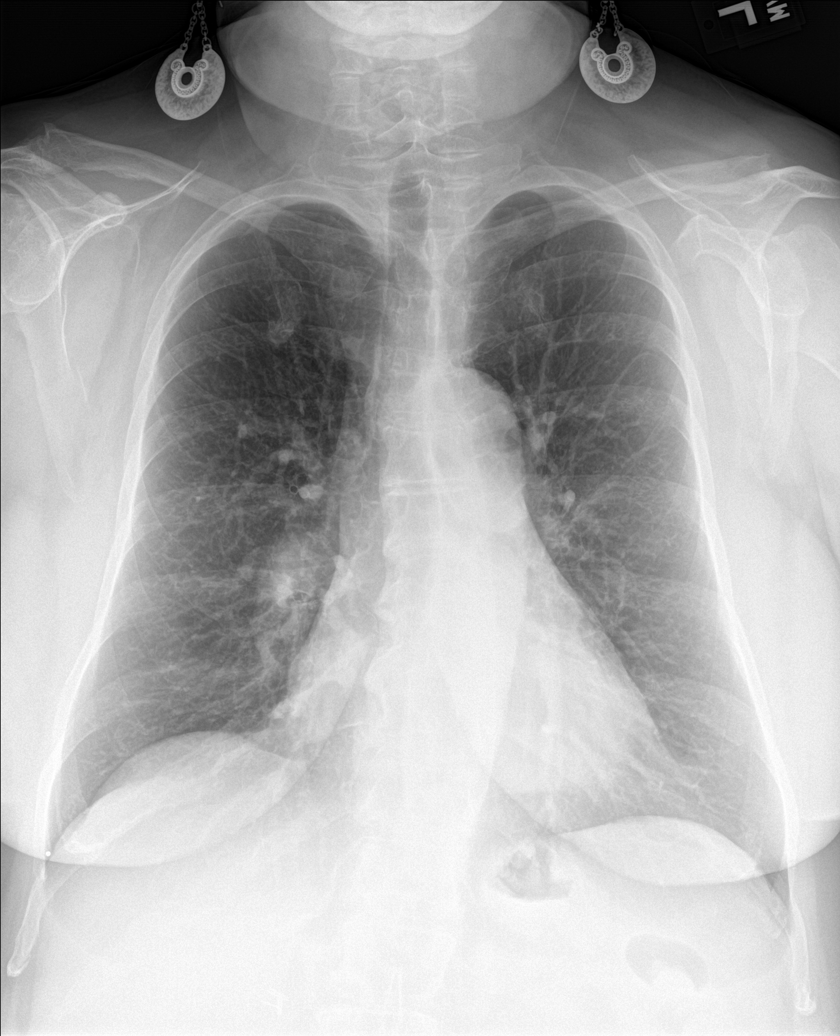

[rib pa (1 of 2)]
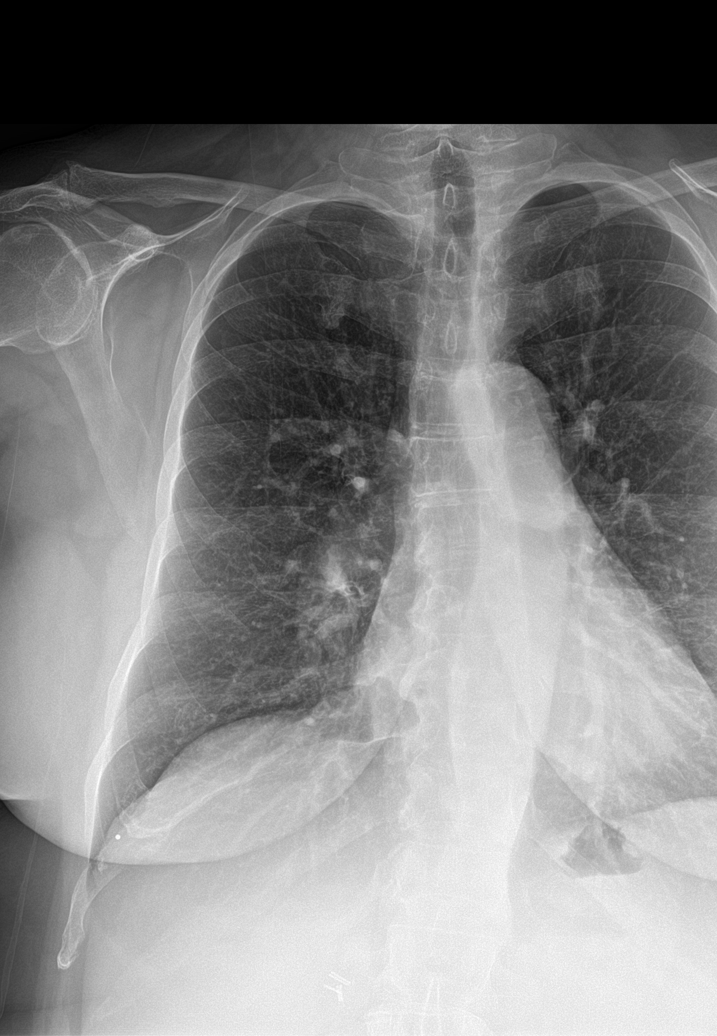

[rib pa (2 of 2)]
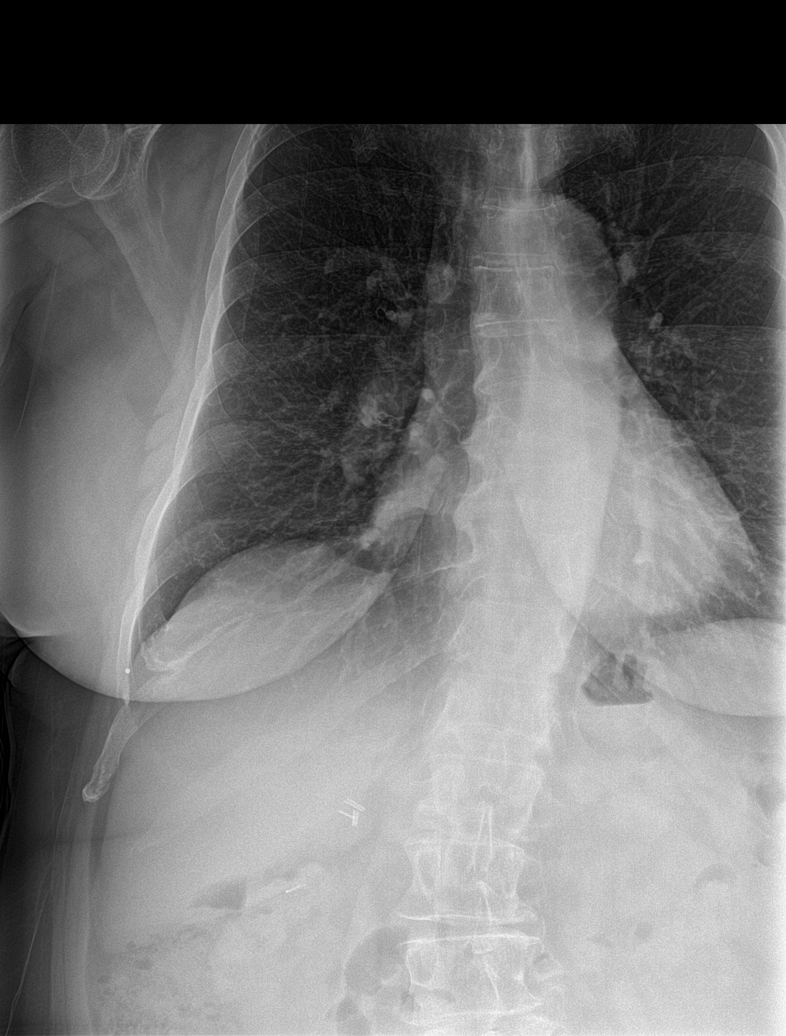

[rib obl (1 of 2)]
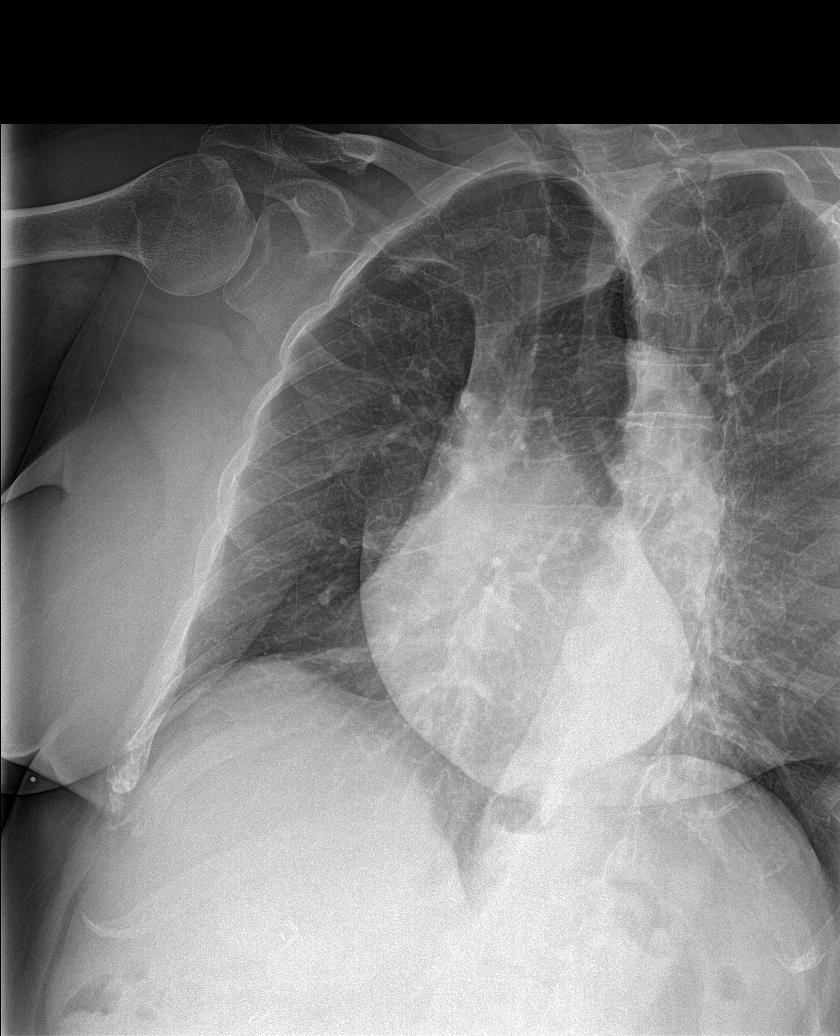

[rib obl (2 of 2)]
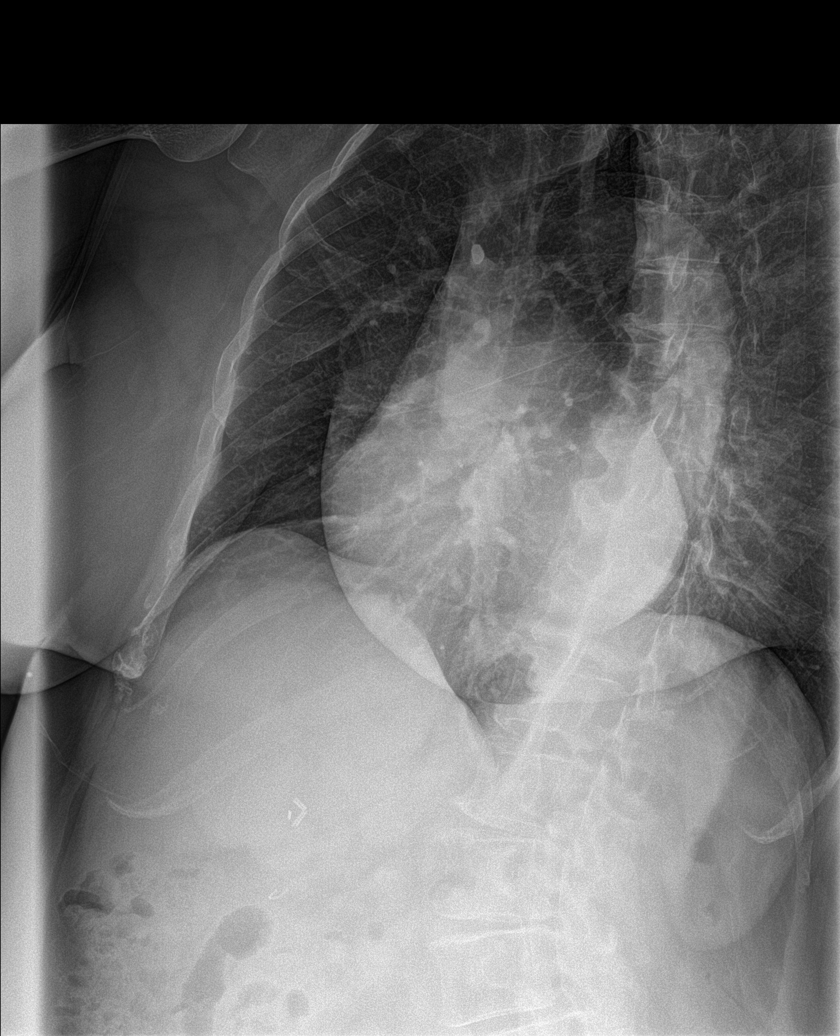

[5 of 5 positions shown; findings below may reference images not displayed]

FINDINGS: heart is borderline in size. Lungs are clear. No effusions or
pneumothorax. No acute bony abnormality. No visible rib fracture.
IMPRESSION: No active disease.

## 2017-09-23 ENCOUNTER — Other Ambulatory Visit: Payer: Self-pay | Admitting: Family Medicine

## 2017-09-23 NOTE — Telephone Encounter (Signed)
Vitamin D2 refill Last Refill:07/05/17 # 12 Last OV: 07/18/17 PCP: Dr Olevia PerchesMegan Johnson Pharmacy: [facility] [address] [place], [place]

## 2017-10-07 ENCOUNTER — Encounter: Payer: Self-pay | Admitting: Family Medicine

## 2017-10-07 ENCOUNTER — Ambulatory Visit (INDEPENDENT_AMBULATORY_CARE_PROVIDER_SITE_OTHER): Payer: Medicare Other | Admitting: Family Medicine

## 2017-10-07 ENCOUNTER — Other Ambulatory Visit: Payer: Self-pay

## 2017-10-07 VITALS — BP 132/68 | HR 86 | Temp 98.2°F | Ht 64.0 in | Wt 199.0 lb

## 2017-10-07 DIAGNOSIS — F324 Major depressive disorder, single episode, in partial remission: Secondary | ICD-10-CM

## 2017-10-07 DIAGNOSIS — Z23 Encounter for immunization: Secondary | ICD-10-CM | POA: Diagnosis not present

## 2017-10-07 MED ORDER — ARIPIPRAZOLE 5 MG PO TABS: 5.0000 mg | ORAL_TABLET | Freq: Every day | ORAL | 3 refills | Status: DC

## 2017-10-07 NOTE — Assessment & Plan Note (Signed)
Not under good control. PHQ9 getting worse. Offered referral to psychiatry- declined. Will continue wellbutrin and effexor and start abilify. Recheck 1 month. Call with any concerns.

## 2017-10-07 NOTE — Progress Notes (Signed)
BP 132/68 (BP Location: Left Arm, Cuff Size: Normal)   Pulse 86   Temp 98.2 F (36.8 C) (Oral)   Ht 5\' 4"  (1.626 m)   Wt 199 lb (90.3 kg)   SpO2 96%   BMI 34.16 kg/m    Subjective:    Patient ID: [name] [name], female    DOB: 1950-05-15, 67 y.o.   MRN: [id number]  HPI: [name] [name] is a 67 y.o. female  Chief Complaint  Patient presents with  . Depression    1 m f/u   Saw Dr. Malvin Johns in August. He doe  DEPRESSION Mood status: worse Satisfied with current treatment?: no Symptom severity: moderate  Duration of current treatment : chronic Side effects: no Medication compliance: good compliance Psychotherapy/counseling: no  Previous psychiatric medications: wellbutrin, venlafaxine, buspar Depressed mood: yes Anxious mood: yes Anhedonia: no Significant weight loss or gain: no Insomnia: no  Fatigue: yes Feelings of worthlessness or guilt: yes Impaired concentration/indecisiveness: yes Suicidal ideations: no Hopelessness: yes Crying spells: yes Depression screen [facility] 2/9 10/07/2017 08/26/2017 01/19/2017 01/19/2017 02/03/2016  Decreased Interest 2 2 1  0 1  Down, Depressed, Hopeless 2 3 1  0 0  PHQ - 2 Score 4 5 2  0 1  Altered sleeping 2 2 1  - -  Tired, decreased energy 2 1 2  - -  Change in appetite 2 0 0 - -  Feeling bad or failure about yourself  1 2 1  - -  Trouble concentrating 2 2 1  - -  Moving slowly or fidgety/restless 2 0 1 - -  Suicidal thoughts 0 0 0 - -  PHQ-9 Score 15 12 8  - -  Difficult doing work/chores Very difficult Very difficult Not difficult at all - -    Relevant past medical, surgical, family and social history reviewed and updated as indicated. Interim medical history since our last visit reviewed. Allergies and medications reviewed and updated.  Review of Systems  Constitutional: Negative.   Respiratory: Negative.   Cardiovascular: Negative.   Skin: Negative.   Neurological: Negative.   Psychiatric/Behavioral: Positive for behavioral  problems, confusion, decreased concentration, dysphoric mood and sleep disturbance. Negative for agitation, hallucinations, self-injury and suicidal ideas. The patient is nervous/anxious. The patient is not hyperactive.     Per HPI unless specifically indicated above     Objective:    BP 132/68 (BP Location: Left Arm, Cuff Size: Normal)   Pulse 86   Temp 98.2 F (36.8 C) (Oral)   Ht 5\' 4"  (1.626 m)   Wt 199 lb (90.3 kg)   SpO2 96%   BMI 34.16 kg/m   Wt Readings from Last 3 Encounters:  10/07/17 199 lb (90.3 kg)  08/26/17 202 lb 9.6 oz (91.9 kg)  07/14/17 200 lb (90.7 kg)    Physical Exam  Constitutional: She is oriented to person, place, and time. She appears well-developed and well-nourished. No distress.  HENT:  Head: Normocephalic and atraumatic.  Right Ear: Hearing normal.  Left Ear: Hearing normal.  Nose: Nose normal.  Eyes: Conjunctivae and lids are normal. Right eye exhibits no discharge. Left eye exhibits no discharge. No scleral icterus.  Cardiovascular: Normal rate, regular rhythm, normal heart sounds and intact distal pulses. Exam reveals no gallop and no friction rub.  No murmur heard. Pulmonary/Chest: Effort normal and breath sounds normal. No stridor. No respiratory distress. She has no wheezes. She has no rales. She exhibits no tenderness.  Musculoskeletal: Normal range of motion.  Neurological: She is alert and oriented  to person, place, and time.  Skin: Skin is warm, dry and intact. Capillary refill takes less than 2 seconds. No rash noted. She is not diaphoretic. No erythema. No pallor.  Psychiatric: She has a normal mood and affect. Her speech is normal and behavior is normal. Judgment and thought content normal. Cognition and memory are normal.  Nursing note and vitals reviewed.   Results for orders placed or performed during the hospital encounter of 07/14/17  Lipase, blood  Result Value Ref Range   Lipase 26 11 - 51 U/L  Comprehensive metabolic panel    Result Value Ref Range   Sodium 138 135 - 145 mmol/L   Potassium 3.9 3.5 - 5.1 mmol/L   Chloride 103 101 - 111 mmol/L   CO2 21 (L) 22 - 32 mmol/L   Glucose, Bld 106 (H) 65 - 99 mg/dL   BUN 16 6 - 20 mg/dL   Creatinine, Ser 7.26 0.44 - 1.00 mg/dL   Calcium 9.6 8.9 - 20.3 mg/dL   Total Protein 7.5 6.5 - 8.1 g/dL   Albumin 4.5 3.5 - 5.0 g/dL   AST 67 (H) 15 - 41 U/L   ALT 69 (H) 14 - 54 U/L   Alkaline Phosphatase 74 38 - 126 U/L   Total Bilirubin 1.2 0.3 - 1.2 mg/dL   GFR calc non Af Amer >60 >60 mL/min   GFR calc Af Amer >60 >60 mL/min   Anion gap 14 5 - 15  CBC  Result Value Ref Range   WBC 8.3 3.6 - 11.0 K/uL   RBC 5.23 (H) 3.80 - 5.20 MIL/uL   Hemoglobin 15.3 12.0 - 16.0 g/dL   HCT 55.9 74.1 - 63.8 %   MCV 85.8 80.0 - 100.0 fL   MCH 29.3 26.0 - 34.0 pg   MCHC 34.1 32.0 - 36.0 g/dL   RDW 45.3 64.6 - 80.3 %   Platelets 238 150 - 440 K/uL  Urinalysis, Complete w Microscopic  Result Value Ref Range   Color, Urine YELLOW (A) YELLOW   APPearance CLEAR (A) CLEAR   Specific Gravity, Urine 1.010 1.005 - 1.030   pH 6.0 5.0 - 8.0   Glucose, UA NEGATIVE NEGATIVE mg/dL   Hgb urine dipstick NEGATIVE NEGATIVE   Bilirubin Urine NEGATIVE NEGATIVE   Ketones, ur 20 (A) NEGATIVE mg/dL   Protein, ur NEGATIVE NEGATIVE mg/dL   Nitrite NEGATIVE NEGATIVE   Leukocytes, UA NEGATIVE NEGATIVE   WBC, UA 0-5 0 - 5 WBC/hpf   Bacteria, UA NONE SEEN NONE SEEN   Squamous Epithelial / LPF 0-5 0 - 5   Mucus PRESENT       Assessment & Plan:   Problem List Items Addressed This Visit      Other   Depression - Primary    Not under good control. PHQ9 getting worse. Offered referral to psychiatry- declined. Will continue wellbutrin and effexor and start abilify. Recheck 1 month. Call with any concerns.       Other Visit Diagnoses    Flu vaccine need       Flu shot given today.   Relevant Orders   Flu vaccine HIGH DOSE PF (Completed)       Follow up plan: Return in about 4 weeks (around  11/04/2017) for Follow up mood.

## 2017-11-07 ENCOUNTER — Ambulatory Visit: Payer: Medicare Other | Admitting: Family Medicine

## 2017-11-14 ENCOUNTER — Ambulatory Visit: Payer: Medicare Other | Admitting: Family Medicine

## 2017-11-14 ENCOUNTER — Encounter: Payer: Self-pay | Admitting: Family Medicine

## 2017-11-14 VITALS — BP 149/88 | HR 86 | Temp 97.9°F | Ht 64.0 in | Wt 198.4 lb

## 2017-11-14 DIAGNOSIS — N390 Urinary tract infection, site not specified: Secondary | ICD-10-CM

## 2017-11-14 LAB — UA/M W/RFLX CULTURE, ROUTINE
BILIRUBIN UA: NEGATIVE
Glucose, UA: NEGATIVE
Ketones, UA: NEGATIVE
Nitrite, UA: NEGATIVE
Specific Gravity, UA: 1.02 (ref 1.005–1.030)
UUROB: 0.2 mg/dL (ref 0.2–1.0)
pH, UA: 6 (ref 5.0–7.5)

## 2017-11-14 LAB — MICROSCOPIC EXAMINATION

## 2017-11-14 MED ORDER — SULFAMETHOXAZOLE-TRIMETHOPRIM 800-160 MG PO TABS: 1.0000 | ORAL_TABLET | Freq: Two times a day (BID) | ORAL | 0 refills | Status: DC

## 2017-11-14 NOTE — Patient Instructions (Signed)
Follow up as scheduled.  

## 2017-11-14 NOTE — Progress Notes (Signed)
BP (!) 149/88 (BP Location: Left Arm, Patient Position: Sitting, Cuff Size: Normal)   Pulse 86   Temp 97.9 F (36.6 C)   Ht 5\' 4"  (1.626 m)   Wt 198 lb 7 oz (90 kg)   SpO2 93%   BMI 34.06 kg/m    Subjective:    Patient ID: [name] [name], female    DOB: 19-Sep-1950, 67 y.o.   MRN: [id number]  HPI: [name] [name] is a 67 y.o. female  Chief Complaint  Patient presents with  . Urinary Frequency   Started 4 days ago with urinary frequency, dribbling stream, and urgency. Started while she was at the beach last week. Not trying anything OTC for sxs. No fevers, chills, back pain, N/V/D.   Relevant past medical, surgical, family and social history reviewed and updated as indicated. Interim medical history since our last visit reviewed. Allergies and medications reviewed and updated.  Review of Systems  Per HPI unless specifically indicated above     Objective:    BP (!) 149/88 (BP Location: Left Arm, Patient Position: Sitting, Cuff Size: Normal)   Pulse 86   Temp 97.9 F (36.6 C)   Ht 5\' 4"  (1.626 m)   Wt 198 lb 7 oz (90 kg)   SpO2 93%   BMI 34.06 kg/m   Wt Readings from Last 3 Encounters:  11/14/17 198 lb 7 oz (90 kg)  10/07/17 199 lb (90.3 kg)  08/26/17 202 lb 9.6 oz (91.9 kg)    Physical Exam  Constitutional: She is oriented to person, place, and time. She appears well-developed and well-nourished. No distress.  HENT:  Head: Atraumatic.  Eyes: Conjunctivae and EOM are normal.  Neck: Normal range of motion. Neck supple.  Cardiovascular: Normal rate, regular rhythm and normal heart sounds.  Pulmonary/Chest: Effort normal and breath sounds normal.  Abdominal: Soft. Bowel sounds are normal. There is no tenderness.  Musculoskeletal: Normal range of motion. She exhibits no tenderness (No CVA tenderness b/l).  Neurological: She is alert and oriented to person, place, and time.  Skin: Skin is warm and dry.  Psychiatric: She has a normal mood and affect. Her behavior is  normal.  Nursing note and vitals reviewed.   Results for orders placed or performed during the hospital encounter of 07/14/17  Lipase, blood  Result Value Ref Range   Lipase 26 11 - 51 U/L  Comprehensive metabolic panel  Result Value Ref Range   Sodium 138 135 - 145 mmol/L   Potassium 3.9 3.5 - 5.1 mmol/L   Chloride 103 101 - 111 mmol/L   CO2 21 (L) 22 - 32 mmol/L   Glucose, Bld 106 (H) 65 - 99 mg/dL   BUN 16 6 - 20 mg/dL   Creatinine, Ser 9.52 0.44 - 1.00 mg/dL   Calcium 9.6 8.9 - 84.1 mg/dL   Total Protein 7.5 6.5 - 8.1 g/dL   Albumin 4.5 3.5 - 5.0 g/dL   AST 67 (H) 15 - 41 U/L   ALT 69 (H) 14 - 54 U/L   Alkaline Phosphatase 74 38 - 126 U/L   Total Bilirubin 1.2 0.3 - 1.2 mg/dL   GFR calc non Af Amer >60 >60 mL/min   GFR calc Af Amer >60 >60 mL/min   Anion gap 14 5 - 15  CBC  Result Value Ref Range   WBC 8.3 3.6 - 11.0 K/uL   RBC 5.23 (H) 3.80 - 5.20 MIL/uL   Hemoglobin 15.3 12.0 - 16.0 g/dL  HCT 44.9 35.0 - 47.0 %   MCV 85.8 80.0 - 100.0 fL   MCH 29.3 26.0 - 34.0 pg   MCHC 34.1 32.0 - 36.0 g/dL   RDW 16.1 09.6 - 04.5 %   Platelets 238 150 - 440 K/uL  Urinalysis, Complete w Microscopic  Result Value Ref Range   Color, Urine YELLOW (A) YELLOW   APPearance CLEAR (A) CLEAR   Specific Gravity, Urine 1.010 1.005 - 1.030   pH 6.0 5.0 - 8.0   Glucose, UA NEGATIVE NEGATIVE mg/dL   Hgb urine dipstick NEGATIVE NEGATIVE   Bilirubin Urine NEGATIVE NEGATIVE   Ketones, ur 20 (A) NEGATIVE mg/dL   Protein, ur NEGATIVE NEGATIVE mg/dL   Nitrite NEGATIVE NEGATIVE   Leukocytes, UA NEGATIVE NEGATIVE   WBC, UA 0-5 0 - 5 WBC/hpf   Bacteria, UA NONE SEEN NONE SEEN   Squamous Epithelial / LPF 0-5 0 - 5   Mucus PRESENT       Assessment & Plan:   Problem List Items Addressed This Visit    None    Visit Diagnoses    Acute lower UTI    -  Primary   U/A +, await cx and tx with bactrim. Probiotics, push fluids, urinate fully and frequently. F/u if not improving   Relevant  Medications   sulfamethoxazole-trimethoprim (BACTRIM DS,SEPTRA DS) 800-160 MG tablet   Other Relevant Orders   UA/M w/rflx Culture, Routine   Urine Culture       Follow up plan: Return for as scheduled next week.

## 2017-11-17 LAB — URINE CULTURE

## 2017-11-22 ENCOUNTER — Ambulatory Visit: Payer: Medicare Other | Admitting: Family Medicine

## 2017-11-24 ENCOUNTER — Ambulatory Visit (INDEPENDENT_AMBULATORY_CARE_PROVIDER_SITE_OTHER): Payer: Medicare Other | Admitting: Family Medicine

## 2017-11-24 ENCOUNTER — Encounter: Payer: Self-pay | Admitting: Family Medicine

## 2017-11-24 VITALS — BP 128/84 | HR 84 | Temp 98.6°F | Ht 64.0 in | Wt 200.0 lb

## 2017-11-24 DIAGNOSIS — I10 Essential (primary) hypertension: Secondary | ICD-10-CM | POA: Diagnosis not present

## 2017-11-24 DIAGNOSIS — F324 Major depressive disorder, single episode, in partial remission: Secondary | ICD-10-CM | POA: Diagnosis not present

## 2017-11-24 MED ORDER — ARIPIPRAZOLE 5 MG PO TABS: 5.0000 mg | ORAL_TABLET | Freq: Every day | ORAL | 3 refills | Status: DC

## 2017-11-24 MED ORDER — HYDROXYZINE HCL 25 MG PO TABS: 25.0000 mg | ORAL_TABLET | Freq: Three times a day (TID) | ORAL | 6 refills | Status: DC | PRN

## 2017-11-24 MED ORDER — BUPROPION HCL ER (XL) 300 MG PO TB24: ORAL_TABLET | ORAL | 1 refills | Status: DC

## 2017-11-24 MED ORDER — NAPROXEN 500 MG PO TABS: 500.0000 mg | ORAL_TABLET | Freq: Two times a day (BID) | ORAL | 1 refills | Status: DC

## 2017-11-24 MED ORDER — VENLAFAXINE HCL ER 150 MG PO CP24: 150.0000 mg | ORAL_CAPSULE | Freq: Every day | ORAL | 1 refills | Status: DC

## 2017-11-24 MED ORDER — HYDROCHLOROTHIAZIDE 25 MG PO TABS: 25.0000 mg | ORAL_TABLET | Freq: Every day | ORAL | 1 refills | Status: DC

## 2017-11-24 NOTE — Assessment & Plan Note (Signed)
Under good control on current regimen. Continue current regimen. Continue to monitor. Call with any concerns. Refills given.   

## 2017-11-24 NOTE — Assessment & Plan Note (Signed)
Doing better. Continue current regimen. Continue to monitor. Call with any concerns. Refills given today.  

## 2017-11-24 NOTE — Progress Notes (Signed)
BP 128/84 (BP Location: Left Arm, Patient Position: Sitting, Cuff Size: Normal)   Pulse 84   Temp 98.6 F (37 C) (Oral)   Ht 5\' 4"  (1.626 m)   Wt 200 lb (90.7 kg)   SpO2 97%   BMI 34.33 kg/m    Subjective:    Patient ID: [name] [name], female    DOB: March 14, 1950, 67 y.o.   MRN: [id number]  HPI: [name] [name] is a 67 y.o. female  Chief Complaint  Patient presents with  . Depression    1 month F/U     DEPRESSION Mood status: better Satisfied with current treatment?: yes Symptom severity: mild  Duration of current treatment : chronic Side effects: no Medication compliance: excellent compliance Psychotherapy/counseling: no  Previous psychiatric medications: abilify, wellbutrin, effexor Depressed mood: no Anxious mood: no Anhedonia: no Significant weight loss or gain: no Insomnia: no  Fatigue: no Feelings of worthlessness or guilt: no Impaired concentration/indecisiveness: no Suicidal ideations: no Hopelessness: no Crying spells: no Depression screen [facility] 2/9 11/24/2017 10/07/2017 08/26/2017 01/19/2017 01/19/2017  Decreased Interest 1 2 2 1  0  Down, Depressed, Hopeless 1 2 3 1  0  PHQ - 2 Score 2 4 5 2  0  Altered sleeping 0 2 2 1  -  Tired, decreased energy 1 2 1 2  -  Change in appetite 0 2 0 0 -  Feeling bad or failure about yourself  0 1 2 1  -  Trouble concentrating 0 2 2 1  -  Moving slowly or fidgety/restless 1 2 0 1 -  Suicidal thoughts 0 0 0 0 -  PHQ-9 Score 4 15 12 8  -  Difficult doing work/chores Somewhat difficult Very difficult Very difficult Not difficult at all -   HYPERTENSION Hypertension status: controlled  Satisfied with current treatment? yes Duration of hypertension: chronic BP monitoring frequency:  not checking BP medication side effects:  no Medication compliance: excellent compliance Previous BP meds: hctz Aspirin: no Recurrent headaches: no Visual changes: no Palpitations: no Dyspnea: no Chest pain: no Lower extremity edema:  no Dizzy/lightheaded: no  Relevant past medical, surgical, family and social history reviewed and updated as indicated. Interim medical history since our last visit reviewed. Allergies and medications reviewed and updated.  Review of Systems  Constitutional: Negative.   Respiratory: Negative.   Cardiovascular: Negative.   Neurological: Negative.   Psychiatric/Behavioral: Negative.     Per HPI unless specifically indicated above     Objective:    BP 128/84 (BP Location: Left Arm, Patient Position: Sitting, Cuff Size: Normal)   Pulse 84   Temp 98.6 F (37 C) (Oral)   Ht 5\' 4"  (1.626 m)   Wt 200 lb (90.7 kg)   SpO2 97%   BMI 34.33 kg/m   Wt Readings from Last 3 Encounters:  11/24/17 200 lb (90.7 kg)  11/14/17 198 lb 7 oz (90 kg)  10/07/17 199 lb (90.3 kg)    Physical Exam  Constitutional: She is oriented to person, place, and time. She appears well-developed and well-nourished. No distress.  HENT:  Head: Normocephalic and atraumatic.  Right Ear: Hearing normal.  Left Ear: Hearing normal.  Nose: Nose normal.  Eyes: Conjunctivae and lids are normal. Right eye exhibits no discharge. Left eye exhibits no discharge. No scleral icterus.  Cardiovascular: Normal rate, regular rhythm, normal heart sounds and intact distal pulses. Exam reveals no gallop and no friction rub.  No murmur heard. Pulmonary/Chest: Effort normal and breath sounds normal. No stridor. No respiratory distress. She  has no wheezes. She has no rales. She exhibits no tenderness.  Musculoskeletal: Normal range of motion.  Neurological: She is alert and oriented to person, place, and time.  Skin: Skin is warm, dry and intact. Capillary refill takes less than 2 seconds. No rash noted. She is not diaphoretic. No erythema. No pallor.  Psychiatric: She has a normal mood and affect. Her speech is normal and behavior is normal. Judgment and thought content normal. Cognition and memory are normal.  Nursing note and  vitals reviewed.   Results for orders placed or performed in visit on 11/14/17  Urine Culture  Result Value Ref Range   Urine Culture, Routine Final report (A)    Organism ID, Bacteria Comment (A)    Antimicrobial Susceptibility Comment   Microscopic Examination  Result Value Ref Range   WBC, UA >30 (A) 0 - 5 /hpf   RBC, UA 3-10 (A) 0 - 2 /hpf   Epithelial Cells (non renal) 0-10 0 - 10 /hpf   Bacteria, UA Few None seen/Few  UA/M w/rflx Culture, Routine  Result Value Ref Range   Specific Gravity, UA 1.020 1.005 - 1.030   pH, UA 6.0 5.0 - 7.5   Color, UA Yellow Yellow   Appearance Ur Cloudy (A) Clear   Leukocytes, UA 1+ (A) Negative   Protein, UA 2+ (A) Negative/Trace   Glucose, UA Negative Negative   Ketones, UA Negative Negative   RBC, UA 2+ (A) Negative   Bilirubin, UA Negative Negative   Urobilinogen, Ur 0.2 0.2 - 1.0 mg/dL   Nitrite, UA Negative Negative   Microscopic Examination See below:       Assessment & Plan:   Problem List Items Addressed This Visit      Cardiovascular and Mediastinum   Essential (primary) hypertension    Under good control on current regimen. Continue current regimen. Continue to monitor. Call with any concerns. Refills given.        Relevant Medications   hydrochlorothiazide (HYDRODIURIL) 25 MG tablet   Other Relevant Orders   Basic metabolic panel     Other   Depression - Primary    Doing better. Continue current regimen. Continue to monitor. Call with any concerns. Refills given today.      Relevant Medications   venlafaxine XR (EFFEXOR-XR) 150 MG 24 hr capsule   buPROPion (WELLBUTRIN XL) 300 MG 24 hr tablet   hydrOXYzine (ATARAX/VISTARIL) 25 MG tablet       Follow up plan: Return Around the first of the year, for Physical/wellness.

## 2017-11-25 ENCOUNTER — Encounter: Payer: Self-pay | Admitting: Family Medicine

## 2017-11-25 LAB — BASIC METABOLIC PANEL
BUN / CREAT RATIO: 21 (ref 12–28)
BUN: 17 mg/dL (ref 8–27)
CHLORIDE: 100 mmol/L (ref 96–106)
CO2: 24 mmol/L (ref 20–29)
Calcium: 9.2 mg/dL (ref 8.7–10.3)
Creatinine, Ser: 0.82 mg/dL (ref 0.57–1.00)
GFR calc non Af Amer: 74 mL/min/{1.73_m2} (ref >59)
GFR, EST AFRICAN AMERICAN: 85 mL/min/{1.73_m2} (ref >59)
Glucose: 88 mg/dL (ref 65–99)
Potassium: 3.9 mmol/L (ref 3.5–5.2)
Sodium: 139 mmol/L (ref 134–144)

## 2017-12-13 ENCOUNTER — Other Ambulatory Visit: Payer: Self-pay | Admitting: Family Medicine

## 2017-12-13 NOTE — Telephone Encounter (Signed)
Requested medication (s) are due for refill today: yes  Requested medication (s) are on the active medication list: yes  Last refill:  09/23/17 #12  Future visit scheduled: yes, 02/24/18  Notes to clinic:  Unable to fill per protocol    Requested Prescriptions  Pending Prescriptions Disp Refills   Vitamin D, Ergocalciferol, (DRISDOL) 1.25 MG (50000 UT) CAPS capsule [Pharmacy Med Name: VITAMIN D2 1.25MG (50,000 UNIT)] 12 capsule 0    Sig: Take 1 capsule (50,000 Units total) by mouth every 7 (seven) days.     Endocrinology:  Vitamins - Vitamin D Supplementation Failed - 12/13/2017  3:39 PM      Failed - 50,000 IU strengths are not delegated      Failed - Phosphate in normal range and within 360 days    No results found for: PHOS       Failed - Vit D in normal range and within 360 days    Vit D, 25-Hydroxy  Date Value Ref Range Status  07/14/2017 23.8 (L) 30.0 - 100.0 ng/mL Final    Comment:    Vitamin D deficiency has been defined by the Institute of Medicine and an Endocrine Society practice guideline as a level of serum 25-OH vitamin D less than 20 ng/mL (1,2). The Endocrine Society went on to further define vitamin D insufficiency as a level between 21 and 29 ng/mL (2). 1. IOM (Institute of Medicine). 2010. Dietary reference    intakes for calcium and D. [place]: The    [employer]. 2. Holick MF, Binkley [place], Bischoff-Ferrari HA, et al.    Evaluation, treatment, and prevention of vitamin D    deficiency: an Endocrine Society clinical practice    guideline. JCEM. 2011 Jul; 96(7):1911-30.          Passed - Ca in normal range and within 360 days    Calcium  Date Value Ref Range Status  11/24/2017 9.2 8.7 - 10.3 mg/dL Final         Passed - Valid encounter within last 12 months    Recent Outpatient Visits          2 weeks ago Major depressive disorder with single episode, in partial remission (HCC)   [facility] [place] [name] [place]   4  weeks ago Acute lower UTI   [facility] [address], [place]   2 months ago Major depressive disorder with single episode, in partial remission [facility](HCC)   [facility] [name]   3 months ago Major depressive disorder with single episode, in partial remission [facility](HCC)   [facility] [place]   5 months ago Gastroenteritis   [facility] [place]      Future Appointments            In 2 months Johnson[name][facility]

## 2018-01-26 ENCOUNTER — Ambulatory Visit: Payer: Medicare Other | Admitting: Family Medicine

## 2018-01-26 ENCOUNTER — Ambulatory Visit: Payer: Medicare Other

## 2018-01-27 ENCOUNTER — Ambulatory Visit (INDEPENDENT_AMBULATORY_CARE_PROVIDER_SITE_OTHER): Payer: Medicare Other | Admitting: Family Medicine

## 2018-01-27 ENCOUNTER — Encounter: Payer: Self-pay | Admitting: Family Medicine

## 2018-01-27 VITALS — BP 141/85 | HR 82 | Temp 98.7°F | Wt 201.5 lb

## 2018-01-27 DIAGNOSIS — J069 Acute upper respiratory infection, unspecified: Secondary | ICD-10-CM

## 2018-01-27 MED ORDER — BENZONATATE 200 MG PO CAPS: 200.0000 mg | ORAL_CAPSULE | Freq: Three times a day (TID) | ORAL | 0 refills | Status: DC | PRN

## 2018-01-27 MED ORDER — PROMETHAZINE-DM 6.25-15 MG/5ML PO SYRP: 2.5000 mL | ORAL_SOLUTION | Freq: Four times a day (QID) | ORAL | 0 refills | Status: DC | PRN

## 2018-01-27 MED ORDER — AZITHROMYCIN 250 MG PO TABS: ORAL_TABLET | ORAL | 0 refills | Status: DC

## 2018-01-27 NOTE — Progress Notes (Signed)
BP (!) 141/85   Pulse 82   Temp 98.7 F (37.1 C) (Oral)   Wt 201 lb 8 oz (91.4 kg)   SpO2 97%   BMI 34.59 kg/m    Subjective:    Patient ID: [name] [name], female    DOB: 10-04-1950, 67 y.o.   MRN: [id number]  HPI: [name] [name] is a 67 y.o. female  Chief Complaint  Patient presents with  . URI    pt states she has had a cough, congesiton, and sinus pressure for the past 3 weeks    3 weeks of cough, congestion, sinus pain and pressure, fatigue, wheezing, SOB. Coughing so hard she's urinating on herself and not able to sleep. Taking nyquil, cough syrups, and mucinex with no relief. Several sick contacts at home. Denies known fevers, sweats, CP, N/V/D. No hx of pulmonary issues, does have allergic rhinitis.   Relevant past medical, surgical, family and social history reviewed and updated as indicated. Interim medical history since our last visit reviewed. Allergies and medications reviewed and updated.  Review of Systems  Per HPI unless specifically indicated above     Objective:    BP (!) 141/85   Pulse 82   Temp 98.7 F (37.1 C) (Oral)   Wt 201 lb 8 oz (91.4 kg)   SpO2 97%   BMI 34.59 kg/m   Wt Readings from Last 3 Encounters:  01/27/18 201 lb 8 oz (91.4 kg)  11/24/17 200 lb (90.7 kg)  11/14/17 198 lb 7 oz (90 kg)    Physical Exam Vitals signs and nursing note reviewed.  Constitutional:      Appearance: Normal appearance.  HENT:     Head: Atraumatic.     Right Ear: Tympanic membrane and external ear normal.     Left Ear: Tympanic membrane and external ear normal.     Nose: Congestion present.     Mouth/Throat:     Mouth: Mucous membranes are moist.     Pharynx: Posterior oropharyngeal erythema present.  Eyes:     Extraocular Movements: Extraocular movements intact.     Conjunctiva/sclera: Conjunctivae normal.  Neck:     Musculoskeletal: Normal range of motion and neck supple.  Cardiovascular:     Rate and Rhythm: Normal rate and regular rhythm.   Heart sounds: Normal heart sounds.  Pulmonary:     Effort: Pulmonary effort is normal.     Breath sounds: Wheezing (minimal) present.  Musculoskeletal: Normal range of motion.  Skin:    General: Skin is warm and dry.  Neurological:     Mental Status: She is alert and oriented to person, place, and time.  Psychiatric:        Mood and Affect: Mood normal.        Thought Content: Thought content normal.     Results for orders placed or performed in visit on 11/24/17  Basic metabolic panel  Result Value Ref Range   Glucose 88 65 - 99 mg/dL   BUN 17 8 - 27 mg/dL   Creatinine, Ser 4.090.82 0.57 - 1.00 mg/dL   GFR calc non Af Amer 74 >59 mL/min/1.73   GFR calc Af Amer 85 >59 mL/min/1.73   BUN/Creatinine Ratio 21 12 - 28   Sodium 139 134 - 144 mmol/L   Potassium 3.9 3.5 - 5.2 mmol/L   Chloride 100 96 - 106 mmol/L   CO2 24 20 - 29 mmol/L   Calcium 9.2 8.7 - 10.3 mg/dL  Assessment & Plan:   Problem List Items Addressed This Visit    None    Visit Diagnoses    Upper respiratory tract infection, unspecified type    -  Primary   Tx with zpak, tessalon, promethazine DM, mucinex, flonase, supportive care. F/u if worsening or not improving.    Relevant Medications   azithromycin (ZITHROMAX) 250 MG tablet       Follow up plan: Return if symptoms worsen or fail to improve.

## 2018-02-09 ENCOUNTER — Ambulatory Visit: Payer: Medicare Other

## 2018-02-24 ENCOUNTER — Other Ambulatory Visit: Payer: Self-pay

## 2018-02-24 ENCOUNTER — Encounter: Payer: Self-pay | Admitting: Family Medicine

## 2018-02-24 ENCOUNTER — Ambulatory Visit (INDEPENDENT_AMBULATORY_CARE_PROVIDER_SITE_OTHER): Payer: Medicare Other | Admitting: Family Medicine

## 2018-02-24 VITALS — BP 136/84 | HR 79 | Temp 98.4°F | Ht 64.0 in | Wt 199.0 lb

## 2018-02-24 DIAGNOSIS — M810 Age-related osteoporosis without current pathological fracture: Secondary | ICD-10-CM

## 2018-02-24 DIAGNOSIS — I10 Essential (primary) hypertension: Secondary | ICD-10-CM | POA: Diagnosis not present

## 2018-02-24 DIAGNOSIS — G4733 Obstructive sleep apnea (adult) (pediatric): Secondary | ICD-10-CM

## 2018-02-24 DIAGNOSIS — F324 Major depressive disorder, single episode, in partial remission: Secondary | ICD-10-CM

## 2018-02-24 DIAGNOSIS — E782 Mixed hyperlipidemia: Secondary | ICD-10-CM

## 2018-02-24 DIAGNOSIS — R413 Other amnesia: Secondary | ICD-10-CM

## 2018-02-24 DIAGNOSIS — N3281 Overactive bladder: Secondary | ICD-10-CM

## 2018-02-24 DIAGNOSIS — R7301 Impaired fasting glucose: Secondary | ICD-10-CM

## 2018-02-24 DIAGNOSIS — E559 Vitamin D deficiency, unspecified: Secondary | ICD-10-CM

## 2018-02-24 DIAGNOSIS — Z1239 Encounter for other screening for malignant neoplasm of breast: Secondary | ICD-10-CM

## 2018-02-24 DIAGNOSIS — Z0000 Encounter for general adult medical examination without abnormal findings: Secondary | ICD-10-CM | POA: Diagnosis not present

## 2018-02-24 LAB — UA/M W/RFLX CULTURE, ROUTINE
Bilirubin, UA: NEGATIVE
Glucose, UA: NEGATIVE
Ketones, UA: NEGATIVE
LEUKOCYTES UA: NEGATIVE
NITRITE UA: NEGATIVE
PH UA: 6.5 (ref 5.0–7.5)
Protein, UA: NEGATIVE
RBC, UA: NEGATIVE
Specific Gravity, UA: 1.025 (ref 1.005–1.030)
UUROB: 0.2 mg/dL (ref 0.2–1.0)

## 2018-02-24 LAB — BAYER DCA HB A1C WAIVED: HB A1C (BAYER DCA - WAIVED): 5.3 % (ref <7.0)

## 2018-02-24 LAB — MICROALBUMIN, URINE WAIVED
Creatinine, Urine Waived: 200 mg/dL (ref 10–300)
MICROALB, UR WAIVED: 30 mg/L — AB (ref 0–19)
Microalb/Creat Ratio: <30 mg/g (ref <30)

## 2018-02-24 NOTE — Assessment & Plan Note (Signed)
Under good control on current regimen. Continue current regimen. Continue to monitor. Call with any concerns. Refills given. Labs drawn today.   

## 2018-02-24 NOTE — Assessment & Plan Note (Signed)
Not on CPAP. Continue to monitor. Call with any concerns.

## 2018-02-24 NOTE — Assessment & Plan Note (Signed)
Rechecking levels today. Await results.  

## 2018-02-24 NOTE — Patient Instructions (Addendum)
Call to schedule your bone density and mammogram: [facility]  Address: [address], [place], [place] [address]  Phone: [phone]   Preventative Services:  Health Risk Assessment and Personalized Prevention Plan: Done today Bone Mass Measurements: Ordered today Breast Cancer Screening: Ordered today CVD Screening: Done today Cervical Cancer Screening: N/A Colon Cancer Screening: Up to date Depression Screening: Done today Diabetes Screening: Done today Glaucoma Screening: See your eye doctor Hepatitis B vaccine: N/A Hepatitis C screening: Up to date HIV Screening: Up to date Flu Vaccine: Up to date Lung cancer Screening: N/A Obesity Screening: Done today Pneumonia Vaccines (2): Up to date STI Screening: N/A   Health Maintenance After Age 69 After age 69, you are at a higher risk for certain long-term diseases and infections as well as injuries from falls. Falls are a major cause of broken bones and head injuries in people who are older than age 69. Getting regular preventive care can help to keep you healthy and well. Preventive care includes getting regular testing and making lifestyle changes as recommended by your health care provider. Talk with your health care provider about:  Which screenings and tests you should have. A screening is a test that checks for a disease when you have no symptoms.  A diet and exercise plan that is right for you. What should I know about screenings and tests to prevent falls? Screening and testing are the best ways to find a health problem early. Early diagnosis and treatment give you the best chance of managing medical conditions that are common after age 69. Certain conditions and lifestyle choices may make you more likely to have a fall. Your health care provider may recommend:  Regular vision checks. Poor vision and conditions such as cataracts can make you more likely to have a fall. If you wear  glasses, make sure to get your prescription updated if your vision changes.  Medicine review. Work with your health care provider to regularly review all of the medicines you are taking, including over-the-counter medicines. Ask your health care provider about any side effects that may make you more likely to have a fall. Tell your health care provider if any medicines that you take make you feel dizzy or sleepy.  Osteoporosis screening. Osteoporosis is a condition that causes the bones to get weaker. This can make the bones weak and cause them to break more easily.  Blood pressure screening. Blood pressure changes and medicines to control blood pressure can make you feel dizzy.  Strength and balance checks. Your health care provider may recommend certain tests to check your strength and balance while standing, walking, or changing positions.  Foot health exam. Foot pain and numbness, as well as not wearing proper footwear, can make you more likely to have a fall.  Depression screening. You may be more likely to have a fall if you have a fear of falling, feel emotionally low, or feel unable to do activities that you used to do.  Alcohol use screening. Using too much alcohol can affect your balance and may make you more likely to have a fall. What actions can I take to lower my risk of falls? General instructions  Talk with your health care provider about your risks for falling. Tell your health care provider if: ? You fall. Be sure to tell your health care provider about all falls, even ones that seem minor. ? You feel dizzy, sleepy, or off-balance.  Take over-the-counter  and prescription medicines only as told by your health care provider. These include any supplements.  Eat a healthy diet and maintain a healthy weight. A healthy diet includes low-fat dairy products, low-fat (lean) meats, and fiber from whole grains, beans, and lots of fruits and vegetables. Home safety  Remove any  tripping hazards, such as rugs, cords, and clutter.  Install safety equipment such as grab bars in bathrooms and safety rails on stairs.  Keep rooms and walkways well-lit. Activity   Follow a regular exercise program to stay fit. This will help you maintain your balance. Ask your health care provider what types of exercise are appropriate for you.  If you need a cane or walker, use it as recommended by your health care provider.  Wear supportive shoes that have nonskid soles. Lifestyle  Do not drink alcohol if your health care provider tells you not to drink.  If you drink alcohol, limit how much you have: ? 0-1 drink a day for women. ? 0-2 drinks a day for men.  Be aware of how much alcohol is in your drink. In the [place] one drink equals one typical bottle of beer (12 oz), one-half glass of wine (5 oz), or one shot of hard liquor (1 oz).  Do not use any products that contain nicotine or tobacco, such as cigarettes and e-cigarettes. If you need help quitting, ask your health care provider. Summary  Having a healthy lifestyle and getting preventive care can help to protect your health and wellness after age 37.  Screening and testing are the best way to find a health problem early and help you avoid having a fall. Early diagnosis and treatment give you the best chance for managing medical conditions that are more common for people who are older than age 45.  Falls are a major cause of broken bones and head injuries in people who are older than age 19. Take precautions to prevent a fall at home.  Work with your health care provider to learn what changes you can make to improve your health and wellness and to prevent falls. This information is not intended to replace advice given to you by your health care provider. Make sure you discuss any questions you have with your health care provider. Document Released: 12/01/2016 Document Revised: 12/01/2016 Document Reviewed:  12/01/2016 Elsevier Interactive Patient Education  2019 [employer].

## 2018-02-24 NOTE — Assessment & Plan Note (Signed)
Doing well with A1c of 5.3. Continue diet and exercise. Call with any concerns.

## 2018-02-24 NOTE — Assessment & Plan Note (Signed)
Continue to follow with neurology. Call with any concerns. Continue to monitor.  

## 2018-02-24 NOTE — Assessment & Plan Note (Signed)
Due for recheck on her DEXA- ordered today. 

## 2018-02-24 NOTE — Assessment & Plan Note (Signed)
Checking UA today. Await results. Call with any concerns.  

## 2018-02-24 NOTE — Assessment & Plan Note (Signed)
Not under good control. Does not want to see psychiatrist or counselor. Just started taking her medicine appropriately- will give her another 6 weeks to get into her system and recheck then.

## 2018-02-24 NOTE — Progress Notes (Signed)
BP 136/84   Pulse 79   Temp 98.4 F (36.9 C) (Oral)   Ht 5\' 4"  (1.626 m)   Wt 199 lb (90.3 kg)   SpO2 95%   BMI 34.16 kg/m    Subjective:    Patient ID: [name] [name], female    DOB: 05/25/1950, 68 y.o.   MRN: [id number]  HPI: [name] [name] is a 68 y.o. female presenting on 02/24/2018 for comprehensive medical examination. Current medical complaints include:  HYPERTENSION / HYPERLIPIDEMIA Satisfied with current treatment? yes Duration of hypertension: chronic BP monitoring frequency: not checking BP medication side effects: no Past BP meds: HCTZ Duration of hyperlipidemia: chronic Cholesterol medication side effects: Not on anything- went up last 6 months, here for recheck on it today Cholesterol supplements: none Past cholesterol medications: none Medication compliance: good compliance Aspirin: no Recent stressors: yes Recurrent headaches: no Visual changes: no Palpitations: no Dyspnea: no Chest pain: no Lower extremity edema: no Dizzy/lightheaded: no  Impaired Fasting Glucose HbA1C:  Lab Results  Component Value Date   HGBA1C 5.5 07/14/2017   Duration of elevated blood sugar: chronic Polydipsia: no Polyuria: no Weight change: no Visual disturbance: no Glucose Monitoring: no Diabetic Education: Not Completed Family history of diabetes: yes  DEPRESSION- has been back on her medicine for about 2 weeks. Has been doing really poorly. Mood has been terrible. Not sure what's going on Mood status: exacerbated Satisfied with current treatment?: no Symptom severity: moderate  Duration of current treatment : chronic Side effects: no Medication compliance: fair compliance Psychotherapy/counseling: no  Previous psychiatric medications: effexor, wellbutrin, abilify Depressed mood: yes Anxious mood: yes Anhedonia: no Significant weight loss or gain: no Insomnia: yes  Fatigue: yes Feelings of worthlessness or guilt: yes Impaired concentration/indecisiveness:  no Suicidal ideations: no Hopelessness: no Crying spells: no Depression screen [facility] 2/9 02/24/2018 11/24/2017 10/07/2017 08/26/2017 01/19/2017  Decreased Interest 1 1 2 2 1   Down, Depressed, Hopeless 2 1 2 3 1   PHQ - 2 Score 3 2 4 5 2   Altered sleeping 2 0 2 2 1   Tired, decreased energy 2 1 2 1 2   Change in appetite 0 0 2 0 0  Feeling bad or failure about yourself  1 0 1 2 1   Trouble concentrating 1 0 2 2 1   Moving slowly or fidgety/restless 2 1 2  0 1  Suicidal thoughts 1 0 0 0 0  PHQ-9 Score 12 4 15 12 8   Difficult doing work/chores - Somewhat difficult Very difficult Very difficult Not difficult at all   GAD 7 : Generalized Anxiety Score 02/03/2016 02/13/2015  Nervous, Anxious, on Edge 1 2  Control/stop worrying 1 2  Worry too much - different things 1 2  Trouble relaxing 1 2  Restless 1 0  Easily annoyed or irritable 1 2  Afraid - awful might happen 0 0  Total GAD 7 Score 6 10  Anxiety Difficulty Somewhat difficult Somewhat difficult    She currently lives with: husband Menopausal Symptoms: no- still some hot flashes  Functional Status Survey: Is the patient deaf or have difficulty hearing?: No Does the patient have difficulty seeing, even when wearing glasses/contacts?: No Does the patient have difficulty concentrating, remembering, or making decisions?: Yes Does the patient have difficulty walking or climbing stairs?: No Does the patient have difficulty dressing or bathing?: No Does the patient have difficulty doing errands alone such as visiting a doctor's office or shopping?: No  Fall Risk  02/24/2018 01/19/2017 01/19/2017 02/03/2016 01/07/2016  Falls in the past year? 0 Yes No Yes No  Number falls in past yr: - 2 or more - 1 -  Injury with Fall? - Yes - No -  Risk for fall due to : - History of fall(s);Impaired balance/gait - - -    Depression Screen Depression screen [facility] 2/9 02/24/2018 11/24/2017 10/07/2017 08/26/2017 01/19/2017  Decreased Interest 1 1 2 2 1   Down,  Depressed, Hopeless 2 1 2 3 1   PHQ - 2 Score 3 2 4 5 2   Altered sleeping 2 0 2 2 1   Tired, decreased energy 2 1 2 1 2   Change in appetite 0 0 2 0 0  Feeling bad or failure about yourself  1 0 1 2 1   Trouble concentrating 1 0 2 2 1   Moving slowly or fidgety/restless 2 1 2  0 1  Suicidal thoughts 1 0 0 0 0  PHQ-9 Score 12 4 15 12 8   Difficult doing work/chores - Somewhat difficult Very difficult Very difficult Not difficult at all   Advanced Directives Does patient have a HCPOA?    no Does patient have a living will or MOST form?  no  Past Medical History:  Past Medical History:  Diagnosis Date  . Migraines     Surgical History:  Past Surgical History:  Procedure Laterality Date  . ABDOMINAL HYSTERECTOMY    . CARPAL TUNNEL RELEASE    . CESAREAN SECTION    . CHOLECYSTECTOMY    . HAMMER TOE SURGERY Bilateral     Medications:  Current Outpatient Medications on File Prior to Visit  Medication Sig  . ARIPiprazole (ABILIFY) 5 MG tablet Take 1 tablet (5 mg total) by mouth daily.  . benzonatate (TESSALON) 200 MG capsule Take 1 capsule (200 mg total) by mouth 3 (three) times daily as needed.  Marland Kitchen buPROPion (WELLBUTRIN XL) 300 MG 24 hr tablet Take 1 tablet (300 mg total) by mouth daily.  Marland Kitchen donepezil (ARICEPT) 5 MG tablet Take by mouth.  . hydrochlorothiazide (HYDRODIURIL) 25 MG tablet Take 1 tablet (25 mg total) by mouth daily.  . hydrOXYzine (ATARAX/VISTARIL) 25 MG tablet Take 1 tablet (25 mg total) by mouth 3 (three) times daily as needed.  . naproxen (NAPROSYN) 500 MG tablet Take 1 tablet (500 mg total) by mouth 2 (two) times daily with a meal.  . venlafaxine XR (EFFEXOR-XR) 150 MG 24 hr capsule Take 1 capsule (150 mg total) by mouth at bedtime.  . Vitamin D, Ergocalciferol, (DRISDOL) 1.25 MG (50000 UT) CAPS capsule Take 1 capsule (50,000 Units total) by mouth every 7 (seven) days. (Patient not taking: Reported on 01/27/2018)   No current facility-administered medications on file  prior to visit.     Allergies:  Allergies  Allergen Reactions  . Codeine Nausea And Vomiting and Nausea Only  . Tussionex Pennkinetic Er [Hydrocod Polst-Cpm Polst Er] Nausea And Vomiting    Social History:  Social History   Socioeconomic History  . Marital status: Married    Spouse name: Not on file  . Number of children: Not on file  . Years of education: Not on file  . Highest education level: Not on file  Occupational History  . Not on file  Social Needs  . Financial resource strain: Not on file  . Food insecurity:    Worry: Not on file    Inability: Not on file  . Transportation needs:    Medical: Not on file    Non-medical: Not on file  Tobacco  Use  . Smoking status: Never Smoker  . Smokeless tobacco: Never Used  Substance and Sexual Activity  . Alcohol use: No    Alcohol/week: 0.0 standard drinks  . Drug use: No  . Sexual activity: Not Currently  Lifestyle  . Physical activity:    Days per week: Not on file    Minutes per session: Not on file  . Stress: Not on file  Relationships  . Social connections:    Talks on phone: Not on file    Gets together: Not on file    Attends religious service: Not on file    Active member of club or organization: Not on file    Attends meetings of clubs or organizations: Not on file    Relationship status: Not on file  . Intimate partner violence:    Fear of current or ex partner: Not on file    Emotionally abused: Not on file    Physically abused: Not on file    Forced sexual activity: Not on file  Other Topics Concern  . Not on file  Social History Narrative   No exercise   Social History   Tobacco Use  Smoking Status Never Smoker  Smokeless Tobacco Never Used   Social History   Substance and Sexual Activity  Alcohol Use No  . Alcohol/week: 0.0 standard drinks    Family History:  Family History  Problem Relation Age of Onset  . Heart disease Mother   . Heart attack Father   . Hypertension Father   .  Cancer Paternal Aunt   . Breast cancer Paternal Aunt     Past medical history, surgical history, medications, allergies, family history and social history reviewed with patient today and changes made to appropriate areas of the chart.   Review of Systems  Constitutional: Positive for diaphoresis. Negative for chills, fever, malaise/fatigue and weight loss.  HENT: Negative.   Eyes: Negative.   Respiratory: Negative.   Cardiovascular: Negative.   Gastrointestinal: Negative.   Genitourinary: Negative.   Musculoskeletal: Negative.   Skin: Negative.   Neurological: Negative.   Endo/Heme/Allergies: Positive for polydipsia. Negative for environmental allergies. Does not bruise/bleed easily.  Psychiatric/Behavioral: Positive for depression and memory loss. Negative for hallucinations, substance abuse and suicidal ideas. The patient is nervous/anxious and has insomnia.     All other ROS negative except what is listed above and in the HPI.      Objective:    BP 136/84   Pulse 79   Temp 98.4 F (36.9 C) (Oral)   Ht 5\' 4"  (1.626 m)   Wt 199 lb (90.3 kg)   SpO2 95%   BMI 34.16 kg/m   Wt Readings from Last 3 Encounters:  02/24/18 199 lb (90.3 kg)  01/27/18 201 lb 8 oz (91.4 kg)  11/24/17 200 lb (90.7 kg)    Physical Exam Vitals signs and nursing note reviewed.  Constitutional:      General: She is not in acute distress.    Appearance: Normal appearance. She is not ill-appearing, toxic-appearing or diaphoretic.  HENT:     Head: Normocephalic and atraumatic.     Right Ear: Tympanic membrane, ear canal and external ear normal. There is no impacted cerumen.     Left Ear: Tympanic membrane, ear canal and external ear normal. There is no impacted cerumen.     Nose: Nose normal. No congestion or rhinorrhea.     Mouth/Throat:     Mouth: Mucous membranes are moist.  Pharynx: Oropharynx is clear. No oropharyngeal exudate or posterior oropharyngeal erythema.  Eyes:     General: No  scleral icterus.       Right eye: No discharge.        Left eye: No discharge.     Extraocular Movements: Extraocular movements intact.     Conjunctiva/sclera: Conjunctivae normal.     Pupils: Pupils are equal, round, and reactive to light.  Neck:     Musculoskeletal: Normal range of motion and neck supple. No neck rigidity or muscular tenderness.     Vascular: No carotid bruit.  Cardiovascular:     Rate and Rhythm: Normal rate and regular rhythm.     Pulses: Normal pulses.     Heart sounds: No murmur. No friction rub. No gallop.   Pulmonary:     Effort: Pulmonary effort is normal. No respiratory distress.     Breath sounds: Normal breath sounds. No stridor. No wheezing, rhonchi or rales.  Chest:     Chest wall: No tenderness.  Abdominal:     General: Abdomen is flat. Bowel sounds are normal. There is no distension.     Palpations: Abdomen is soft. There is no mass.     Tenderness: There is no abdominal tenderness. There is no right CVA tenderness, left CVA tenderness, guarding or rebound.     Hernia: No hernia is present.  Genitourinary:    Comments: Breast and pelvic exams deferred with shared decision making Musculoskeletal:        General: No swelling, tenderness, deformity or signs of injury.     Right lower leg: No edema.     Left lower leg: No edema.  Lymphadenopathy:     Cervical: No cervical adenopathy.  Skin:    General: Skin is warm and dry.     Capillary Refill: Capillary refill takes less than 2 seconds.     Coloration: Skin is not jaundiced or pale.     Findings: No bruising, erythema, lesion or rash.  Neurological:     General: No focal deficit present.     Mental Status: She is alert and oriented to person, place, and time. Mental status is at baseline.     Cranial Nerves: No cranial nerve deficit.     Sensory: No sensory deficit.     Motor: No weakness.     Coordination: Coordination normal.     Gait: Gait normal.     Deep Tendon Reflexes: Reflexes normal.   Psychiatric:        Mood and Affect: Mood normal.        Behavior: Behavior normal.        Thought Content: Thought content normal.        Judgment: Judgment normal.     6CIT Screen 02/24/2018 01/19/2017 01/07/2016  What Year? 0 points 0 points 0 points  What month? 0 points 0 points 0 points  What time? 0 points 0 points 0 points  Count back from 20 0 points 0 points 0 points  Months in reverse 0 points 0 points 0 points  Repeat phrase 6 points 2 points 2 points  Total Score 6 2 2     Results for orders placed or performed in visit on 11/24/17  Basic metabolic panel  Result Value Ref Range   Glucose 88 65 - 99 mg/dL   BUN 17 8 - 27 mg/dL   Creatinine, Ser 1.610.82 0.57 - 1.00 mg/dL   GFR calc non Af Amer 74 >59 mL/min/1.73  GFR calc Af Amer 85 >59 mL/min/1.73   BUN/Creatinine Ratio 21 12 - 28   Sodium 139 134 - 144 mmol/L   Potassium 3.9 3.5 - 5.2 mmol/L   Chloride 100 96 - 106 mmol/L   CO2 24 20 - 29 mmol/L   Calcium 9.2 8.7 - 10.3 mg/dL      Assessment & Plan:   Problem List Items Addressed This Visit      Cardiovascular and Mediastinum   Essential (primary) hypertension    Under good control on current regimen. Continue current regimen. Continue to monitor. Call with any concerns. Refills given. Labs drawn today.       Relevant Orders   CBC with Differential/Platelet   Comprehensive metabolic panel   Microalbumin, Urine Waived   TSH   UA/M w/rflx Culture, Routine     Respiratory   OSA (obstructive sleep apnea)    Not on CPAP. Continue to monitor. Call with any concerns.       Relevant Orders   CBC with Differential/Platelet   Comprehensive metabolic panel   TSH   UA/M w/rflx Culture, Routine     Endocrine   Impaired fasting glucose    Doing well with A1c of 5.3. Continue diet and exercise. Call with any concerns.       Relevant Orders   Bayer DCA Hb A1c Waived   CBC with Differential/Platelet   Comprehensive metabolic panel   Microalbumin, Urine  Waived   TSH   UA/M w/rflx Culture, Routine     Musculoskeletal and Integument   Age-related osteoporosis without current pathological fracture    Due for recheck on her DEXA- ordered today.      Relevant Orders   CBC with Differential/Platelet   Comprehensive metabolic panel   TSH   UA/M w/rflx Culture, Routine   DG Bone Density     Genitourinary   Overactive bladder    Checking UA today. Await results. Call with any concerns.       Relevant Orders   CBC with Differential/Platelet   Comprehensive metabolic panel   TSH   UA/M w/rflx Culture, Routine     Other   Depression    Not under good control. Does not want to see psychiatrist or counselor. Just started taking her medicine appropriately- will give her another 6 weeks to get into her system and recheck then.       Relevant Orders   CBC with Differential/Platelet   Comprehensive metabolic panel   TSH   UA/M w/rflx Culture, Routine   Hyperlipidemia    Elevated last visit- were giving her 6 months to bring it down on her own, recheck labs and treat as needed.       Relevant Orders   CBC with Differential/Platelet   Comprehensive metabolic panel   Lipid Panel w/o Chol/HDL Ratio   TSH   UA/M w/rflx Culture, Routine   Vitamin D deficiency    Rechecking levels today. Await results.       Relevant Orders   CBC with Differential/Platelet   Comprehensive metabolic panel   TSH   UA/M w/rflx Culture, Routine   VITAMIN D 25 Hydroxy (Vit-D Deficiency, Fractures)   Memory loss    Continue to follow with neurology. Call with any concerns. Continue to monitor.       Relevant Orders   CBC with Differential/Platelet   Comprehensive metabolic panel   TSH   UA/M w/rflx Culture, Routine    Other Visit Diagnoses    Medicare annual  wellness visit, subsequent    -  Primary   Preventative care discussed today as below. Call with any concerns.    Routine general medical examination at a health care facility        Vaccines up to date. Screening labs checked today. Pap N/A. Mammogram and DEXA ordered. Colonoscopy up to date. Continue diet and exercise. Call with concerns.    Screening for breast cancer       Mammogram ordered today   Relevant Orders   MM DIGITAL SCREENING BILATERAL      Preventative Services:  Health Risk Assessment and Personalized Prevention Plan: Done today Bone Mass Measurements: Ordered today Breast Cancer Screening: Ordered today CVD Screening: Done today Cervical Cancer Screening: N/A Colon Cancer Screening: Up to date Depression Screening: Done today Diabetes Screening: Done today Glaucoma Screening: See your eye doctor Hepatitis B vaccine: N/A Hepatitis C screening: Up to date HIV Screening: Up to date Flu Vaccine: Up to date Lung cancer Screening: N/A Obesity Screening: Done today Pneumonia Vaccines (2): Up to date STI Screening: N/A  Follow up plan: Return in about 6 weeks (around 04/07/2018) for Follow up mood.   LABORATORY TESTING:  - Pap smear: not applicable  IMMUNIZATIONS:   - Tdap: Tetanus vaccination status reviewed: last tetanus booster within 10 years. - Influenza: Up to date - Pneumovax: Up to date - Prevnar: Up to date - Zostavax vaccine: Up to date  SCREENING: -Mammogram: Ordered today  - Colonoscopy: Up to date  - Bone Density: Ordered today   PATIENT COUNSELING:   Advised to take 1 mg of folate supplement per day if capable of pregnancy.   Sexuality: Discussed sexually transmitted diseases, partner selection, use of condoms, avoidance of unintended pregnancy  and contraceptive alternatives.   Advised to avoid cigarette smoking.  I discussed with the patient that most people either abstain from alcohol or drink within safe limits (<=14/week and <=4 drinks/occasion for males, <=7/weeks and <= 3 drinks/occasion for females) and that the risk for alcohol disorders and other health effects rises proportionally with the number of drinks per  week and how often a drinker exceeds daily limits.  Discussed cessation/primary prevention of drug use and availability of treatment for abuse.   Diet: Encouraged to adjust caloric intake to maintain  or achieve ideal body weight, to reduce intake of dietary saturated fat and total fat, to limit sodium intake by avoiding high sodium foods and not adding table salt, and to maintain adequate dietary potassium and calcium preferably from fresh fruits, vegetables, and low-fat dairy products.    stressed the importance of regular exercise  Injury prevention: Discussed safety belts, safety helmets, smoke detector, smoking near bedding or upholstery.   Dental health: Discussed importance of regular tooth brushing, flossing, and dental visits.    NEXT PREVENTATIVE PHYSICAL DUE IN 1 YEAR. Return in about 6 weeks (around 04/07/2018) for Follow up mood.

## 2018-02-24 NOTE — Assessment & Plan Note (Signed)
Elevated last visit- were giving her 6 months to bring it down on her own, recheck labs and treat as needed.

## 2018-02-25 LAB — CBC WITH DIFFERENTIAL/PLATELET
Basophils Absolute: 0.1 10*3/uL (ref 0.0–0.2)
Basos: 1 %
EOS (ABSOLUTE): 0.1 10*3/uL (ref 0.0–0.4)
Eos: 2 %
Hematocrit: 43.8 % (ref 34.0–46.6)
Hemoglobin: 14.4 g/dL (ref 11.1–15.9)
Immature Grans (Abs): 0 10*3/uL (ref 0.0–0.1)
Immature Granulocytes: 0 %
LYMPHS ABS: 1.4 10*3/uL (ref 0.7–3.1)
Lymphs: 23 %
MCH: 28.1 pg (ref 26.6–33.0)
MCHC: 32.9 g/dL (ref 31.5–35.7)
MCV: 85 fL (ref 79–97)
MONOS ABS: 0.5 10*3/uL (ref 0.1–0.9)
Monocytes: 8 %
Neutrophils Absolute: 3.9 10*3/uL (ref 1.4–7.0)
Neutrophils: 66 %
Platelets: 241 10*3/uL (ref 150–450)
RBC: 5.13 x10E6/uL (ref 3.77–5.28)
RDW: 12.6 % (ref 11.7–15.4)
WBC: 6 10*3/uL (ref 3.4–10.8)

## 2018-02-25 LAB — COMPREHENSIVE METABOLIC PANEL
ALK PHOS: 85 IU/L (ref 39–117)
ALT: 23 IU/L (ref 0–32)
AST: 20 IU/L (ref 0–40)
Albumin/Globulin Ratio: 2.1 (ref 1.2–2.2)
Albumin: 4.5 g/dL (ref 3.8–4.8)
BUN/Creatinine Ratio: 22 (ref 12–28)
BUN: 21 mg/dL (ref 8–27)
Bilirubin Total: 0.4 mg/dL (ref 0.0–1.2)
CO2: 24 mmol/L (ref 20–29)
CREATININE: 0.94 mg/dL (ref 0.57–1.00)
Calcium: 9.5 mg/dL (ref 8.7–10.3)
Chloride: 101 mmol/L (ref 96–106)
GFR calc Af Amer: 72 mL/min/{1.73_m2} (ref >59)
GFR calc non Af Amer: 63 mL/min/{1.73_m2} (ref >59)
GLOBULIN, TOTAL: 2.1 g/dL (ref 1.5–4.5)
GLUCOSE: 87 mg/dL (ref 65–99)
Potassium: 4.1 mmol/L (ref 3.5–5.2)
SODIUM: 142 mmol/L (ref 134–144)
Total Protein: 6.6 g/dL (ref 6.0–8.5)

## 2018-02-25 LAB — LIPID PANEL W/O CHOL/HDL RATIO
CHOLESTEROL TOTAL: 216 mg/dL — AB (ref 100–199)
HDL: 49 mg/dL (ref >39)
LDL CALC: 133 mg/dL — AB (ref 0–99)
Triglycerides: 169 mg/dL — ABNORMAL HIGH (ref 0–149)
VLDL Cholesterol Cal: 34 mg/dL (ref 5–40)

## 2018-02-25 LAB — TSH: TSH: 0.784 u[IU]/mL (ref 0.450–4.500)

## 2018-02-25 LAB — VITAMIN D 25 HYDROXY (VIT D DEFICIENCY, FRACTURES): Vit D, 25-Hydroxy: 41.5 ng/mL (ref 30.0–100.0)

## 2018-02-27 ENCOUNTER — Encounter: Payer: Self-pay | Admitting: Family Medicine

## 2018-03-06 ENCOUNTER — Other Ambulatory Visit: Payer: Self-pay | Admitting: Family Medicine

## 2018-03-06 NOTE — Telephone Encounter (Signed)
Requested medication (s) are due for refill today: yes  Requested medication (s) are on the active medication list: yes    Last refill: 12/14/17  #12  0 refills  Future visit scheduled Yes  04/07/2018  Notes to clinic:not delegated  Requested Prescriptions  Pending Prescriptions Disp Refills   Vitamin D, Ergocalciferol, (DRISDOL) 1.25 MG (50000 UT) CAPS capsule [Pharmacy Med Name: VITAMIN D2 1.25MG (50,000 UNIT)] 12 capsule 0    Sig: Take 1 capsule (50,000 Units total) by mouth every 7 (seven) days.     Endocrinology:  Vitamins - Vitamin D Supplementation Failed - 03/06/2018  3:41 PM      Failed - 50,000 IU strengths are not delegated      Failed - Phosphate in normal range and within 360 days    No results found for: PHOS       Passed - Ca in normal range and within 360 days    Calcium  Date Value Ref Range Status  02/24/2018 9.5 8.7 - 10.3 mg/dL Final         Passed - Vitamin D in normal range and within 360 days    Vit D, 25-Hydroxy  Date Value Ref Range Status  02/24/2018 41.5 30.0 - 100.0 ng/mL Final    Comment:    Vitamin D deficiency has been defined by the Institute of Medicine and an Endocrine Society practice guideline as a level of serum 25-OH vitamin D less than 20 ng/mL (1,2). The Endocrine Society went on to further define vitamin D insufficiency as a level between 21 and 29 ng/mL (2). 1. IOM (Institute of Medicine). 2010. Dietary reference    intakes for calcium and D. [place]: The    [employer]. 2. Holick MF, Binkley [place], Bischoff-Ferrari HA, et al.    Evaluation, treatment, and prevention of vitamin D    deficiency: an Endocrine Society clinical practice    guideline. JCEM. 2011 Jul; 96(7):1911-30.          Passed - Valid encounter within last 12 months    Recent Outpatient Visits          1 week ago Medicare annual wellness visit, subsequent   [facility] [place], [place], DO   1 month ago Upper respiratory tract  infection, unspecified type   [facility], [place], [place]   3 months ago Major depressive disorder with single episode, in partial remission [facility])   [facility] [place], [place], DO   3 months ago Acute lower UTI   [facility], [place], [place]   5 months ago Major depressive disorder with single episode, in partial remission [facility])   [facility] [place], Oralia Rud, DO      Future Appointments            In 3 days  [facility], [facility]   In 1 month Johnson, Oralia Rud, DO [facility], [facility]

## 2018-03-09 ENCOUNTER — Ambulatory Visit: Payer: Medicare Other

## 2018-04-07 ENCOUNTER — Ambulatory Visit: Payer: Medicare Other | Admitting: Family Medicine

## 2018-05-26 ENCOUNTER — Other Ambulatory Visit: Payer: Self-pay | Admitting: Family Medicine

## 2018-05-29 ENCOUNTER — Other Ambulatory Visit: Payer: Self-pay | Admitting: Family Medicine

## 2018-05-29 NOTE — Telephone Encounter (Signed)
Requested Prescriptions  Pending Prescriptions Disp Refills  . naproxen (NAPROSYN) 500 MG tablet [Pharmacy Med Name: NAPROXEN 500 MG TABLET] 180 tablet 0    Sig: Take 1 tablet (500 mg total) by mouth 2 (two) times daily with a meal.     Analgesics:  NSAIDS Passed - 05/29/2018  9:21 AM      Passed - Cr in normal range and within 360 days    Creatinine, Ser  Date Value Ref Range Status  02/24/2018 0.94 0.57 - 1.00 mg/dL Final         Passed - HGB in normal range and within 360 days    Hemoglobin  Date Value Ref Range Status  02/24/2018 14.4 11.1 - 15.9 g/dL Final         Passed - Patient is not pregnant      Passed - Valid encounter within last 12 months    Recent Outpatient Visits          3 months ago Medicare annual wellness visit, subsequent   [facility], [name], DO   4 months ago Upper respiratory tract infection, unspecified type   [facility], [place], PA-C   6 months ago Major depressive disorder with single episode, in partial remission [facility])   [facility] [place], [name], DO   6 months ago Acute lower UTI   [facility], [place], [place]   7 months ago Major depressive disorder with single episode, in partial remission (HCC)   [facility] Johnson, [name], DO           . hydrochlorothiazide (HYDRODIURIL) 25 MG tablet [Pharmacy Med Name: HYDROCHLOROTHIAZIDE 25 MG TAB] 90 tablet 0    Sig: Take 1 tablet (25 mg total) by mouth daily.     Cardiovascular: Diuretics - Thiazide Passed - 05/29/2018  9:21 AM      Passed - Ca in normal range and within 360 days    Calcium  Date Value Ref Range Status  02/24/2018 9.5 8.7 - 10.3 mg/dL Final         Passed - Cr in normal range and within 360 days    Creatinine, Ser  Date Value Ref Range Status  02/24/2018 0.94 0.57 - 1.00 mg/dL Final         Passed - K in normal range and within 360 days    Potassium  Date Value Ref  Range Status  02/24/2018 4.1 3.5 - 5.2 mmol/L Final         Passed - Na in normal range and within 360 days    Sodium  Date Value Ref Range Status  02/24/2018 142 134 - 144 mmol/L Final         Passed - Last BP in normal range    BP Readings from Last 1 Encounters:  02/24/18 136/84         Passed - Valid encounter within last 6 months    Recent Outpatient Visits          3 months ago Medicare annual wellness visit, subsequent   [facility], [place], DO   4 months ago Upper respiratory tract infection, unspecified type   [facility], [place], [place]   6 months ago Major depressive disorder with single episode, in partial remission [facility])   [facility] [place], [name], DO   6 months ago Acute lower UTI   [facility] [place], [place]  Lanora ManisElizabeth, PA-C   7 months ago Major depressive disorder with single episode, in partial remission [facility](HCC)   [facility] [place], [place], [place]

## 2018-06-23 ENCOUNTER — Other Ambulatory Visit: Payer: Self-pay | Admitting: Family Medicine

## 2018-06-23 NOTE — Telephone Encounter (Signed)
Requested medication (s) are due for refill today -yes  Requested medication (s) are on the active medication list -yes  Future visit scheduled - no  Last refill: 11/24/17  Notes to clinic: Patient is requesting refill of non delegated Rx- sent for PCP review   Requested Prescriptions  Pending Prescriptions Disp Refills   ARIPiprazole (ABILIFY) 5 MG tablet [Pharmacy Med Name: ARIPIPRAZOLE 5 MG TABLET] 30 tablet 0    Sig: Take 1 tablet (5 mg total) by mouth daily.     Not Delegated - Psychiatry:  Antipsychotics - Second Generation (Atypical) - aripiprazole Failed - 06/23/2018 11:18 AM      Failed - This refill cannot be delegated      Passed - Valid encounter within last 6 months    Recent Outpatient Visits          3 months ago Medicare annual wellness visit, subsequent   [facility] [place], [name], DO   4 months ago Upper respiratory tract infection, unspecified type   [facility], [place], [place]   7 months ago Major depressive disorder with single episode, in partial remission [facility])   [facility] [place], [name], DO   7 months ago Acute lower UTI   [facility], [place], [place]   8 months ago Major depressive disorder with single episode, in partial remission (HCC)   [facility], [name], DO              Requested Prescriptions  Pending Prescriptions Disp Refills   ARIPiprazole (ABILIFY) 5 MG tablet [Pharmacy Med Name: ARIPIPRAZOLE 5 MG TABLET] 30 tablet 0    Sig: Take 1 tablet (5 mg total) by mouth daily.     Not Delegated - Psychiatry:  Antipsychotics - Second Generation (Atypical) - aripiprazole Failed - 06/23/2018 11:18 AM      Failed - This refill cannot be delegated      Passed - Valid encounter within last 6 months    Recent Outpatient Visits          3 months ago Medicare annual wellness visit, subsequent   [facility] [place], [name], DO   4  months ago Upper respiratory tract infection, unspecified type   [facility], [place], [place]   7 months ago Major depressive disorder with single episode, in partial remission [facility])   [facility] [place], [place], DO   7 months ago Acute lower UTI   [facility], [place], [place]   8 months ago Major depressive disorder with single episode, in partial remission [facility])   [facility] [place], [place], DO

## 2018-07-24 ENCOUNTER — Other Ambulatory Visit: Payer: Self-pay | Admitting: Family Medicine

## 2018-07-27 ENCOUNTER — Other Ambulatory Visit: Payer: Self-pay | Admitting: Family Medicine

## 2018-08-22 ENCOUNTER — Other Ambulatory Visit: Payer: Self-pay | Admitting: Family Medicine

## 2018-08-22 NOTE — Telephone Encounter (Signed)
Requested medications are due for refill today?  Yes  Requested medications are on the active medication list?  Yes  Last refill 05/26/2018  Future visit scheduled?  No  Notes to clinic   Requested Prescriptions  Pending Prescriptions Disp Refills   Vitamin D, Ergocalciferol, (DRISDOL) 1.25 MG (50000 UT) CAPS capsule [Pharmacy Med Name: VITAMIN D2 1.25MG (50,000 UNIT)] 12 capsule 0    Sig: Take 1 capsule (50,000 Units total) by mouth every 7 (seven) days.     Endocrinology:  Vitamins - Vitamin D Supplementation Failed - 08/22/2018 12:23 PM      Failed - 50,000 IU strengths are not delegated      Failed - Phosphate in normal range and within 360 days    No results found for: PHOS       Passed - Ca in normal range and within 360 days    Calcium  Date Value Ref Range Status  02/24/2018 9.5 8.7 - 10.3 mg/dL Final         Passed - Vitamin D in normal range and within 360 days    Vit D, 25-Hydroxy  Date Value Ref Range Status  02/24/2018 41.5 30.0 - 100.0 ng/mL Final    Comment:    Vitamin D deficiency has been defined by the Institute of Medicine and an Endocrine Society practice guideline as a level of serum 25-OH vitamin D less than 20 ng/mL (1,2). The Endocrine Society went on to further define vitamin D insufficiency as a level between 21 and 29 ng/mL (2). 1. IOM (Institute of Medicine). 2010. Dietary reference    intakes for calcium and D. [place]: The    [employer]. 2. Holick MF, Binkley Lytle, Bischoff-Ferrari HA, et al.    Evaluation, treatment, and prevention of vitamin D    deficiency: an Endocrine Society clinical practice    guideline. JCEM. 2011 Jul; 96(7):1911-30.          Passed - Valid encounter within last 12 months    Recent Outpatient Visits          5 months ago Medicare annual wellness visit, subsequent   [place], [place], DO   6 months ago Upper respiratory tract infection, unspecified type   [facility], [place], [name]   9 months ago Major depressive disorder with single episode, in partial remission [facility])   [place], [place], DO   9 months ago Acute lower UTI   [facility], [place], [name]   10 months ago Major depressive disorder with single episode, in partial remission [facility])   [place], [place], DO

## 2018-08-29 ENCOUNTER — Other Ambulatory Visit: Payer: Self-pay | Admitting: Family Medicine

## 2018-08-29 NOTE — Telephone Encounter (Signed)
Requested medication (s) are due for refill today: yes  Requested medication (s) are on the active medication list:yes  Last refill: 06/23/2018  Future visit scheduled: no  Notes to clinic:  Medication not delegated    Requested Prescriptions  Pending Prescriptions Disp Refills   ARIPiprazole (ABILIFY) 5 MG tablet [Pharmacy Med Name: ARIPIPRAZOLE 5 MG TABLET] 30 tablet 0    Sig: Take 1 tablet (5 mg total) by mouth daily.     Not Delegated - Psychiatry:  Antipsychotics - Second Generation (Atypical) - aripiprazole Failed - 08/29/2018  1:15 PM      Failed - This refill cannot be delegated      Failed - Valid encounter within last 6 months    Recent Outpatient Visits          6 months ago Medicare annual wellness visit, subsequent   [name], [name], DO   7 months ago Upper respiratory tract infection, unspecified type   [name], [name], [name]   9 months ago Major depressive disorder with single episode, in partial remission [name], [name], DO   9 months ago Acute lower UTI   [name], [name], [name]   10 months ago Major depressive disorder with single episode, in partial remission [name], [name], DO

## 2018-08-31 NOTE — Telephone Encounter (Signed)
Needs appointment

## 2018-09-06 NOTE — Telephone Encounter (Signed)
Spoke with pt and she stated that she would call back.

## 2018-09-18 ENCOUNTER — Other Ambulatory Visit: Payer: Self-pay | Admitting: Family Medicine

## 2018-10-28 ENCOUNTER — Other Ambulatory Visit: Payer: Self-pay | Admitting: Family Medicine

## 2018-10-29 NOTE — Telephone Encounter (Signed)
Requested medication (s) are due for refill today: yes  Requested medication (s) are on the active medication list: yes  Last refill:  10/23/2018  Future visit scheduled: no  Notes to clinic:  Review for refill   Requested Prescriptions  Pending Prescriptions Disp Refills   buPROPion (WELLBUTRIN XL) 300 MG 24 hr tablet [Pharmacy Med Name: BUPROPION HCL XL 300 MG TABLET] 90 tablet 0    Sig: Take 1 tablet (300 mg total) by mouth daily.     Psychiatry: Antidepressants - bupropion Failed - 10/28/2018 10:36 AM      Failed - Valid encounter within last 6 months    Recent Outpatient Visits          8 months ago Medicare annual wellness visit, subsequent   [place], [name], DO   9 months ago Upper respiratory tract infection, unspecified type   [facility], [place], [name]   11 months ago Major depressive disorder with single episode, in partial remission [facility])   [place], [name], DO   11 months ago Acute lower UTI   [facility], [place], [name]   1 year ago Major depressive disorder with single episode, in partial remission [facility])   [facility], [name], DO             Passed - Last BP in normal range    BP Readings from Last 1 Encounters:  02/24/18 136/84         Passed - Completed PHQ-2 or PHQ-9 in the last 360 days.

## 2018-10-30 NOTE — Telephone Encounter (Signed)
Needs appointment

## 2018-10-30 NOTE — Telephone Encounter (Signed)
Duplicate request

## 2018-10-30 NOTE — Telephone Encounter (Signed)
Tried to call patient, home phone no answer, unable to leave a message, cell phone not working.Will try again.

## 2018-10-31 NOTE — Telephone Encounter (Signed)
Called and spoke with patient, she will call back to schedule when she has her calender with her.

## 2018-11-09 ENCOUNTER — Other Ambulatory Visit: Payer: Self-pay

## 2018-11-09 ENCOUNTER — Ambulatory Visit (INDEPENDENT_AMBULATORY_CARE_PROVIDER_SITE_OTHER): Payer: Medicare Other | Admitting: Family Medicine

## 2018-11-09 ENCOUNTER — Encounter: Payer: Self-pay | Admitting: Family Medicine

## 2018-11-09 VITALS — BP 126/84 | HR 78 | Temp 99.2°F | Ht 64.5 in | Wt 199.0 lb

## 2018-11-09 DIAGNOSIS — Z23 Encounter for immunization: Secondary | ICD-10-CM | POA: Diagnosis not present

## 2018-11-09 DIAGNOSIS — F324 Major depressive disorder, single episode, in partial remission: Secondary | ICD-10-CM | POA: Diagnosis not present

## 2018-11-09 DIAGNOSIS — G309 Alzheimer's disease, unspecified: Secondary | ICD-10-CM

## 2018-11-09 DIAGNOSIS — R413 Other amnesia: Secondary | ICD-10-CM

## 2018-11-09 DIAGNOSIS — F0280 Dementia in other diseases classified elsewhere without behavioral disturbance: Secondary | ICD-10-CM

## 2018-11-09 DIAGNOSIS — E559 Vitamin D deficiency, unspecified: Secondary | ICD-10-CM | POA: Diagnosis not present

## 2018-11-09 DIAGNOSIS — R7301 Impaired fasting glucose: Secondary | ICD-10-CM

## 2018-11-09 DIAGNOSIS — E782 Mixed hyperlipidemia: Secondary | ICD-10-CM | POA: Diagnosis not present

## 2018-11-09 DIAGNOSIS — I10 Essential (primary) hypertension: Secondary | ICD-10-CM | POA: Diagnosis not present

## 2018-11-09 LAB — UA/M W/RFLX CULTURE, ROUTINE
Bilirubin, UA: NEGATIVE
Glucose, UA: NEGATIVE
Leukocytes,UA: NEGATIVE
Nitrite, UA: NEGATIVE
Protein,UA: NEGATIVE
RBC, UA: NEGATIVE
Specific Gravity, UA: 1.025 (ref 1.005–1.030)
Urobilinogen, Ur: 0.2 mg/dL (ref 0.2–1.0)
pH, UA: 7 (ref 5.0–7.5)

## 2018-11-09 LAB — MICROALBUMIN, URINE WAIVED
Creatinine, Urine Waived: 200 mg/dL (ref 10–300)
Microalb, Ur Waived: 30 mg/L — ABNORMAL HIGH (ref 0–19)
Microalb/Creat Ratio: <30 mg/g (ref <30)

## 2018-11-09 LAB — BAYER DCA HB A1C WAIVED: HB A1C (BAYER DCA - WAIVED): 5.2 % (ref <7.0)

## 2018-11-09 MED ORDER — HYDROCHLOROTHIAZIDE 25 MG PO TABS: 25.0000 mg | ORAL_TABLET | Freq: Every day | ORAL | 0 refills | Status: DC

## 2018-11-09 MED ORDER — HYDROXYZINE HCL 25 MG PO TABS: 25.0000 mg | ORAL_TABLET | Freq: Three times a day (TID) | ORAL | 0 refills | Status: DC | PRN

## 2018-11-09 MED ORDER — ARIPIPRAZOLE 5 MG PO TABS: 5.0000 mg | ORAL_TABLET | Freq: Every day | ORAL | 0 refills | Status: DC

## 2018-11-09 MED ORDER — NYSTATIN 100000 UNIT/GM EX POWD: Freq: Four times a day (QID) | CUTANEOUS | 0 refills | Status: DC

## 2018-11-09 MED ORDER — VENLAFAXINE HCL ER 150 MG PO CP24: 150.0000 mg | ORAL_CAPSULE | Freq: Every day | ORAL | 1 refills | Status: DC

## 2018-11-09 MED ORDER — BUPROPION HCL ER (XL) 300 MG PO TB24: ORAL_TABLET | ORAL | 0 refills | Status: DC

## 2018-11-09 MED ORDER — NAPROXEN 500 MG PO TABS: 500.0000 mg | ORAL_TABLET | Freq: Two times a day (BID) | ORAL | 0 refills | Status: DC

## 2018-11-09 MED ORDER — VITAMIN D (ERGOCALCIFEROL) 1.25 MG (50000 UNIT) PO CAPS: 50000.0000 [IU] | ORAL_CAPSULE | ORAL | 0 refills | Status: DC

## 2018-11-09 NOTE — Patient Instructions (Signed)
Call to schedule your mammogram: [facility]  Address: [address], [place], [place] [address]  Phone: [phone]  

## 2018-11-09 NOTE — Progress Notes (Signed)
BP 126/84   Pulse 78   Temp 99.2 F (37.3 C) (Oral)   Ht 5' 4.5" (1.638 m)   Wt 199 lb (90.3 kg)   SpO2 96%   BMI 33.63 kg/m    Subjective:    Patient ID: [name] [name], female    DOB: Sep 25, 1950, 68 y.o.   MRN: [id number]  HPI: [name] [name] is a 68 y.o. female  Chief Complaint  Patient presents with  . Depression  . Rash    between legs and under breast area x 3 weeks   Memory has been getting really bad- had bad diarrhea on the higher dose of aricept, stopped it. Has not seen neurology. Has been back on the lower dose. Not doing well. Has been getting worse.   HYPERTENSION / HYPERLIPIDEMIA Satisfied with current treatment? yes Duration of hypertension: chronic BP monitoring frequency: not checking BP range:  BP medication side effects: no Past BP meds: HCTZ Duration of hyperlipidemia: chronic Cholesterol medication side effects: no Cholesterol supplements: none Past cholesterol medications: none Medication compliance: good compliance Aspirin: no Recent stressors: no Recurrent headaches: no Visual changes: no Palpitations: no Dyspnea: no Chest pain: no Lower extremity edema: no Dizzy/lightheaded: no  Impaired Fasting Glucose HbA1C:  Lab Results  Component Value Date   HGBA1C 5.2 11/09/2018   Duration of elevated blood sugar:  Polydipsia: yes Polyuria: yes Weight change: no Visual disturbance: no Glucose Monitoring: no Diabetic Education: Completed Family history of diabetes: yes  DEPRESSION Mood status: stable Satisfied with current treatment?: yes Symptom severity: mild  Duration of current treatment : chronic Side effects: no Medication compliance: excellent compliance Psychotherapy/counseling: no  Previous psychiatric medications: ability, welbutrin, hydroxyzine, effexor Depressed mood: no Anxious mood: no Anhedonia: no Significant weight loss or gain: no Insomnia: no  Fatigue: no Feelings of worthlessness or guilt: no Impaired  concentration/indecisiveness: no Suicidal ideations: no Hopelessness: no Crying spells: no Depression screen [facility] 11/09/2018 02/24/2018 11/24/2017 10/07/2017 08/26/2017  Decreased Interest 2 1 1 2 2   Down, Depressed, Hopeless 1 2 1 2 3   PHQ - 2 Score 3 3 2 4 5   Altered sleeping 0 2 0 2 2  Tired, decreased energy 1 2 1 2 1   Change in appetite 0 0 0 2 0  Feeling bad or failure about yourself  0 1 0 1 2  Trouble concentrating 1 1 0 2 2  Moving slowly or fidgety/restless 0 2 1 2  0  Suicidal thoughts 0 1 0 0 0  PHQ-9 Score 5 12 4 15 12   Difficult doing work/chores Somewhat difficult - Somewhat difficult Very difficult Very difficult   RASH Duration:  weeks  Location: between her legs and under her breasts  Itching: yes Burning: yes Redness: yes Oozing: no Scaling: no Blisters: no Painful: yes Fevers: no Change in detergents/soaps/personal care products: no Recent illness: no Recent travel:no History of same: yes Context: stable Alleviating factors: lotion/moisturizer Treatments attempted:nothing Shortness of breath: no  Throat/tongue swelling: no Myalgias/arthralgias: no  Relevant past medical, surgical, family and social history reviewed and updated as indicated. Interim medical history since our last visit reviewed. Allergies and medications reviewed and updated.  Review of Systems  Constitutional: Negative.   Respiratory: Negative.   Cardiovascular: Negative.   Gastrointestinal: Negative.   Genitourinary: Negative.   Musculoskeletal: Negative.   Skin: Positive for rash. Negative for color change, pallor and wound.  Neurological: Negative.   Hematological: Negative.   Psychiatric/Behavioral: Negative for agitation, behavioral problems, confusion, decreased  concentration, dysphoric mood, hallucinations, self-injury, sleep disturbance and suicidal ideas. The patient is not nervous/anxious and is not hyperactive.     Per HPI unless specifically indicated above      Objective:    BP 126/84   Pulse 78   Temp 99.2 F (37.3 C) (Oral)   Ht 5' 4.5" (1.638 m)   Wt 199 lb (90.3 kg)   SpO2 96%   BMI 33.63 kg/m   Wt Readings from Last 3 Encounters:  11/09/18 199 lb (90.3 kg)  02/24/18 199 lb (90.3 kg)  01/27/18 201 lb 8 oz (91.4 kg)    Physical Exam Vitals signs and nursing note reviewed.  Constitutional:      General: She is not in acute distress.    Appearance: Normal appearance. She is not ill-appearing, toxic-appearing or diaphoretic.  HENT:     Head: Normocephalic and atraumatic.     Right Ear: External ear normal.     Left Ear: External ear normal.     Nose: Nose normal.     Mouth/Throat:     Mouth: Mucous membranes are moist.     Pharynx: Oropharynx is clear.  Eyes:     General: No scleral icterus.       Right eye: No discharge.        Left eye: No discharge.     Extraocular Movements: Extraocular movements intact.     Conjunctiva/sclera: Conjunctivae normal.     Pupils: Pupils are equal, round, and reactive to light.  Neck:     Musculoskeletal: Normal range of motion and neck supple.  Cardiovascular:     Rate and Rhythm: Normal rate and regular rhythm.     Pulses: Normal pulses.     Heart sounds: Normal heart sounds. No murmur. No friction rub. No gallop.   Pulmonary:     Effort: Pulmonary effort is normal. No respiratory distress.     Breath sounds: Normal breath sounds. No stridor. No wheezing, rhonchi or rales.  Chest:     Chest wall: No tenderness.  Musculoskeletal: Normal range of motion.  Skin:    General: Skin is warm and dry.     Capillary Refill: Capillary refill takes less than 2 seconds.     Coloration: Skin is not jaundiced or pale.     Findings: Erythema present. No bruising, lesion or rash.  Neurological:     General: No focal deficit present.     Mental Status: She is alert and oriented to person, place, and time. Mental status is at baseline.  Psychiatric:        Mood and Affect: Mood normal.         Behavior: Behavior normal.        Thought Content: Thought content normal.        Cognition and Memory: Cognition is impaired. Memory is impaired. She exhibits impaired recent memory.        Judgment: Judgment normal.     Results for orders placed or performed in visit on 11/09/18  CBC with Differential/Platelet  Result Value Ref Range   WBC 6.6 3.4 - 10.8 x10E3/uL   RBC 4.98 3.77 - 5.28 x10E6/uL   Hemoglobin 13.8 11.1 - 15.9 g/dL   Hematocrit 41.8 34.0 - 46.6 %   MCV 84 79 - 97 fL   MCH 27.7 26.6 - 33.0 pg   MCHC 33.0 31.5 - 35.7 g/dL   RDW 13.2 11.7 - 15.4 %   Platelets 252 150 - 450 x10E3/uL  Neutrophils 63 Not Estab. %   Lymphs 24 Not Estab. %   Monocytes 9 Not Estab. %   Eos 3 Not Estab. %   Basos 1 Not Estab. %   Neutrophils Absolute 4.2 1.4 - 7.0 x10E3/uL   Lymphocytes Absolute 1.6 0.7 - 3.1 x10E3/uL   Monocytes Absolute 0.6 0.1 - 0.9 x10E3/uL   EOS (ABSOLUTE) 0.2 0.0 - 0.4 x10E3/uL   Basophils Absolute 0.1 0.0 - 0.2 x10E3/uL   Immature Granulocytes 0 Not Estab. %   Immature Grans (Abs) 0.0 0.0 - 0.1 x10E3/uL  Bayer DCA Hb A1c Waived  Result Value Ref Range   HB A1C (BAYER DCA - WAIVED) 5.2 <7.0 %  Comprehensive metabolic panel  Result Value Ref Range   Glucose 75 65 - 99 mg/dL   BUN 19 8 - 27 mg/dL   Creatinine, Ser 9.44 (H) 0.57 - 1.00 mg/dL   GFR calc non Af Amer 55 (L) >59 mL/min/1.73   GFR calc Af Amer 63 >59 mL/min/1.73   BUN/Creatinine Ratio 18 12 - 28   Sodium 141 134 - 144 mmol/L   Potassium 4.0 3.5 - 5.2 mmol/L   Chloride 100 96 - 106 mmol/L   CO2 27 20 - 29 mmol/L   Calcium 9.4 8.7 - 10.3 mg/dL   Total Protein 6.6 6.0 - 8.5 g/dL   Albumin 4.6 3.8 - 4.8 g/dL   Globulin, Total 2.0 1.5 - 4.5 g/dL   Albumin/Globulin Ratio 2.3 (H) 1.2 - 2.2   Bilirubin Total 0.4 0.0 - 1.2 mg/dL   Alkaline Phosphatase 83 39 - 117 IU/L   AST 20 0 - 40 IU/L   ALT 19 0 - 32 IU/L  Lipid Panel w/o Chol/HDL Ratio  Result Value Ref Range   Cholesterol, Total 208 (H) 100  - 199 mg/dL   Triglycerides 967 0 - 149 mg/dL   HDL 45 >59 mg/dL   VLDL Cholesterol Cal 24 5 - 40 mg/dL   LDL Chol Calc (NIH) 163 (H) 0 - 99 mg/dL  Microalbumin, Urine Waived  Result Value Ref Range   Microalb, Ur Waived 30 (H) 0 - 19 mg/L   Creatinine, Urine Waived 200 10 - 300 mg/dL   Microalb/Creat Ratio <30 <30 mg/g  TSH  Result Value Ref Range   TSH 1.040 0.450 - 4.500 uIU/mL  UA/M w/rflx Culture, Routine   Specimen: Blood   BLD  Result Value Ref Range   Specific Gravity, UA 1.025 1.005 - 1.030   pH, UA 7.0 5.0 - 7.5   Color, UA Yellow Yellow   Appearance Ur Clear Clear   Leukocytes,UA Negative Negative   Protein,UA Negative Negative/Trace   Glucose, UA Negative Negative   Ketones, UA Trace (A) Negative   RBC, UA Negative Negative   Bilirubin, UA Negative Negative   Urobilinogen, Ur 0.2 0.2 - 1.0 mg/dL   Nitrite, UA Negative Negative  VITAMIN D 25 Hydroxy (Vit-D Deficiency, Fractures)  Result Value Ref Range   Vit D, 25-Hydroxy 33.9 30.0 - 100.0 ng/mL      Assessment & Plan:   Problem List Items Addressed This Visit      Cardiovascular and Mediastinum   Essential (primary) hypertension    Under good control on current regimen. Continue current regimen. Continue to monitor. Call with any concerns. Refills given. Labs drawn today.        Relevant Medications   hydrochlorothiazide (HYDRODIURIL) 25 MG tablet   Other Relevant Orders   CBC with Differential/Platelet (Completed)  Comprehensive metabolic panel (Completed)   Microalbumin, Urine Waived (Completed)   UA/M w/rflx Culture, Routine (Completed)     Endocrine   Impaired fasting glucose    Rechecking levels today. Await results. Call with any concerns.       Relevant Orders   CBC with Differential/Platelet (Completed)   Bayer DCA Hb A1c Waived (Completed)   Comprehensive metabolic panel (Completed)   UA/M w/rflx Culture, Routine (Completed)     Nervous and Auditory   Dementia (HCC)    Not doing  well. Could not tolerate higher dose of aricept- will try to get her back into see neurology. Call with any concerns.       Relevant Medications   buPROPion (WELLBUTRIN XL) 300 MG 24 hr tablet   hydrOXYzine (ATARAX/VISTARIL) 25 MG tablet   venlafaxine XR (EFFEXOR-XR) 150 MG 24 hr capsule   ARIPiprazole (ABILIFY) 5 MG tablet     Other   Depression    Under good control on current regimen. Continue current regimen. Continue to monitor. Call with any concerns. Refills given. Labs drawn today.       Relevant Medications   buPROPion (WELLBUTRIN XL) 300 MG 24 hr tablet   hydrOXYzine (ATARAX/VISTARIL) 25 MG tablet   venlafaxine XR (EFFEXOR-XR) 150 MG 24 hr capsule   Other Relevant Orders   CBC with Differential/Platelet (Completed)   Comprehensive metabolic panel (Completed)   TSH (Completed)   UA/M w/rflx Culture, Routine (Completed)   Hyperlipidemia    Under good control on current regimen. Continue current regimen. Continue to monitor. Call with any concerns. Refills given. Labs drawn today.       Relevant Medications   hydrochlorothiazide (HYDRODIURIL) 25 MG tablet   Other Relevant Orders   CBC with Differential/Platelet (Completed)   Comprehensive metabolic panel (Completed)   Lipid Panel w/o Chol/HDL Ratio (Completed)   UA/M w/rflx Culture, Routine (Completed)   Vitamin D deficiency    Rechecking levels today. Await results. Call with any concerns.       Relevant Orders   CBC with Differential/Platelet (Completed)   Comprehensive metabolic panel (Completed)   UA/M w/rflx Culture, Routine (Completed)   VITAMIN D 25 Hydroxy (Vit-D Deficiency, Fractures) (Completed)   Memory loss    Not doing well. Will try to get her back into see neurology. Call with any concerns.       Relevant Orders   CBC with Differential/Platelet (Completed)   Comprehensive metabolic panel (Completed)   TSH (Completed)   UA/M w/rflx Culture, Routine (Completed)    Other Visit Diagnoses    Flu  vaccine need    -  Primary   Flu shot given today.   Relevant Orders   Flu Vaccine QUAD High Dose(Fluad) (Completed)       Follow up plan: Return in about 6 months (around 05/10/2019) for Wellness/physical.

## 2018-11-10 LAB — COMPREHENSIVE METABOLIC PANEL
ALT: 19 IU/L (ref 0–32)
AST: 20 IU/L (ref 0–40)
Albumin/Globulin Ratio: 2.3 — ABNORMAL HIGH (ref 1.2–2.2)
Albumin: 4.6 g/dL (ref 3.8–4.8)
Alkaline Phosphatase: 83 IU/L (ref 39–117)
BUN/Creatinine Ratio: 18 (ref 12–28)
BUN: 19 mg/dL (ref 8–27)
Bilirubin Total: 0.4 mg/dL (ref 0.0–1.2)
CO2: 27 mmol/L (ref 20–29)
Calcium: 9.4 mg/dL (ref 8.7–10.3)
Chloride: 100 mmol/L (ref 96–106)
Creatinine, Ser: 1.04 mg/dL — ABNORMAL HIGH (ref 0.57–1.00)
GFR calc Af Amer: 63 mL/min/{1.73_m2} (ref >59)
GFR calc non Af Amer: 55 mL/min/{1.73_m2} — ABNORMAL LOW (ref >59)
Globulin, Total: 2 g/dL (ref 1.5–4.5)
Glucose: 75 mg/dL (ref 65–99)
Potassium: 4 mmol/L (ref 3.5–5.2)
Sodium: 141 mmol/L (ref 134–144)
Total Protein: 6.6 g/dL (ref 6.0–8.5)

## 2018-11-10 LAB — LIPID PANEL W/O CHOL/HDL RATIO
Cholesterol, Total: 208 mg/dL — ABNORMAL HIGH (ref 100–199)
HDL: 45 mg/dL (ref >39)
LDL Chol Calc (NIH): 139 mg/dL — ABNORMAL HIGH (ref 0–99)
Triglycerides: 136 mg/dL (ref 0–149)
VLDL Cholesterol Cal: 24 mg/dL (ref 5–40)

## 2018-11-10 LAB — CBC WITH DIFFERENTIAL/PLATELET
Basophils Absolute: 0.1 10*3/uL (ref 0.0–0.2)
Basos: 1 %
EOS (ABSOLUTE): 0.2 10*3/uL (ref 0.0–0.4)
Eos: 3 %
Hematocrit: 41.8 % (ref 34.0–46.6)
Hemoglobin: 13.8 g/dL (ref 11.1–15.9)
Immature Grans (Abs): 0 10*3/uL (ref 0.0–0.1)
Immature Granulocytes: 0 %
Lymphocytes Absolute: 1.6 10*3/uL (ref 0.7–3.1)
Lymphs: 24 %
MCH: 27.7 pg (ref 26.6–33.0)
MCHC: 33 g/dL (ref 31.5–35.7)
MCV: 84 fL (ref 79–97)
Monocytes Absolute: 0.6 10*3/uL (ref 0.1–0.9)
Monocytes: 9 %
Neutrophils Absolute: 4.2 10*3/uL (ref 1.4–7.0)
Neutrophils: 63 %
Platelets: 252 10*3/uL (ref 150–450)
RBC: 4.98 x10E6/uL (ref 3.77–5.28)
RDW: 13.2 % (ref 11.7–15.4)
WBC: 6.6 10*3/uL (ref 3.4–10.8)

## 2018-11-10 LAB — VITAMIN D 25 HYDROXY (VIT D DEFICIENCY, FRACTURES): Vit D, 25-Hydroxy: 33.9 ng/mL (ref 30.0–100.0)

## 2018-11-10 LAB — TSH: TSH: 1.04 u[IU]/mL (ref 0.450–4.500)

## 2018-11-11 ENCOUNTER — Encounter: Payer: Self-pay | Admitting: Family Medicine

## 2018-11-11 DIAGNOSIS — F0390 Unspecified dementia without behavioral disturbance: Secondary | ICD-10-CM | POA: Insufficient documentation

## 2018-11-11 NOTE — Assessment & Plan Note (Signed)
Rechecking levels today. Await results. Call with any concerns.  

## 2018-11-11 NOTE — Assessment & Plan Note (Signed)
Under good control on current regimen. Continue current regimen. Continue to monitor. Call with any concerns. Refills given. Labs drawn today.   

## 2018-11-11 NOTE — Assessment & Plan Note (Signed)
Not doing well. Will try to get her back into see neurology. Call with any concerns.

## 2018-11-11 NOTE — Assessment & Plan Note (Signed)
Not doing well. Could not tolerate higher dose of aricept- will try to get her back into see neurology. Call with any concerns.

## 2018-11-13 ENCOUNTER — Telehealth: Payer: Self-pay

## 2018-11-13 NOTE — Telephone Encounter (Signed)
PA for hydroxizyne denied.

## 2018-11-13 NOTE — Telephone Encounter (Signed)
PA for Hydroxyzine initiated and submitted via Cover My Meds.  Key: [id number]

## 2018-11-14 ENCOUNTER — Encounter: Payer: Self-pay | Admitting: Family Medicine

## 2018-11-23 ENCOUNTER — Telehealth: Payer: Self-pay

## 2018-11-23 MED ORDER — CLOTRIMAZOLE-BETAMETHASONE 1-0.05 % EX CREA: 1.0000 "application " | TOPICAL_CREAM | Freq: Two times a day (BID) | CUTANEOUS | 0 refills | Status: DC

## 2018-11-23 NOTE — Telephone Encounter (Signed)
Cream sent in. If not getting better will need biopsy

## 2018-11-23 NOTE — Telephone Encounter (Signed)
Patient notified

## 2018-11-23 NOTE — Telephone Encounter (Signed)
Called patient and left a message asking her to return my call.  Copied from [place] [phone]. 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 Patient would like this medication because the one Dr. Wynetta Emery has prescribed isnt working. Please advise.

## 2018-11-23 NOTE — Telephone Encounter (Signed)
Patient returned my call, she states that she has a rash understand her breast, stomach and between her legs, she has had this in the past and would like to know if she can have a cream for this.

## 2018-12-06 ENCOUNTER — Other Ambulatory Visit: Payer: Self-pay | Admitting: Family Medicine

## 2018-12-06 MED ORDER — CLOTRIMAZOLE-BETAMETHASONE 1-0.05 % EX CREA: 1.0000 "application " | TOPICAL_CREAM | Freq: Two times a day (BID) | CUTANEOUS | 0 refills | Status: DC

## 2018-12-06 NOTE — Telephone Encounter (Signed)
Requested medication (s) are due for refill today: no  Requested medication (s) are on the active medication list:yes  Last refill:  11/23/2018  Future visit scheduled:yes  Notes to clinic:  2 bottle request, symptoms still present. Some areas have cleared others have not    Requested Prescriptions  Pending Prescriptions Disp Refills   clotrimazole-betamethasone (LOTRISONE) cream 30 g 0    Sig: Apply 1 application topically 2 (two) times daily.     Off-Protocol Failed - 12/06/2018 11:25 AM      Failed - Medication not assigned to a protocol, review manually.      Passed - Valid encounter within last 12 months    Recent Outpatient Visits          3 weeks ago Flu vaccine need   [facility], [name], DO   9 months ago [employer] annual wellness visit, subsequent   [employer], [place], DO   10 months ago Upper respiratory tract infection, unspecified type   [facility], [place], [name]   1 year ago Major depressive disorder with single episode, in partial remission [facility])   [place], [place] P, DO   1 year ago Acute lower UTI   [facility] [place], [name]      Future Appointments            In 5 months Wynetta Emery, Barb Merino, DO [employer]

## 2018-12-06 NOTE — Telephone Encounter (Signed)
Medication Refill - Medication: clotrimazole-betamethasone (LOTRISONE) cream  2 bottle request, symptoms still present. Some areas have cleared others have not   Has the patient contacted their pharmacy? Yes.   (Agent: If no, request that the patient contact the pharmacy for the refill.) (Agent: If yes, when and what did the pharmacy advise?)  Preferred Pharmacy (with phone number or street name):  [facility] [place], [address]  [place] [address]  Phone: [phone] Fax: [phone]     Agent: Please be advised that RX refills may take up to 3 business days. We ask that you follow-up with your pharmacy.

## 2018-12-06 NOTE — Telephone Encounter (Signed)
Requested Prescriptions  Pending Prescriptions Disp Refills  . ARIPiprazole (ABILIFY) 5 MG tablet [Pharmacy Med Name: ARIPIPRAZOLE 5 MG TABLET] 30 tablet 0    Sig: Take 1 tablet (5 mg total) by mouth daily.     Not Delegated - Psychiatry:  Antipsychotics - Second Generation (Atypical) - aripiprazole Failed - 12/06/2018  3:51 PM      Failed - This refill cannot be delegated      Passed - Valid encounter within last 6 months    Recent Outpatient Visits          3 weeks ago Flu vaccine need   [place], [name], DO   9 months ago [employer] annual wellness visit, subsequent   [employer], [place], DO   10 months ago Upper respiratory tract infection, unspecified type   [facility], [place], [name]   1 year ago Major depressive disorder with single episode, in partial remission [facility])   [place], [place], DO   1 year ago Acute lower UTI   [facility] [place], [name]      Future Appointments            In 5 months Wynetta Emery, Barb Merino, DO [place], [facility]           . hydrOXYzine (ATARAX/VISTARIL) 25 MG tablet [Pharmacy Med Name: HYDROXYZINE HCL 25 MG TABLET] 90 tablet 0    Sig: Take 1 tablet (25 mg total) by mouth 3 (three) times daily as needed.     Ear, Nose, and Throat:  Antihistamines Passed - 12/06/2018  3:51 PM      Passed - Valid encounter within last 12 months    Recent Outpatient Visits          3 weeks ago Flu vaccine need   [place], [name], DO   9 months ago [employer] annual wellness visit, subsequent   [employer], [place], DO   10 months ago Upper respiratory tract infection, unspecified type   [facility], [place], [name]   1 year ago Major depressive disorder with single episode, in partial remission [facility])   [place], [place], DO   1 year ago  Acute lower UTI   [facility] [place], [name]      Future Appointments            In 5 months Wynetta Emery, Barb Merino, DO [employer], [facility]

## 2018-12-26 ENCOUNTER — Encounter: Payer: Self-pay | Admitting: Unknown Physician Specialty

## 2018-12-26 ENCOUNTER — Other Ambulatory Visit: Payer: Self-pay

## 2018-12-26 ENCOUNTER — Ambulatory Visit (INDEPENDENT_AMBULATORY_CARE_PROVIDER_SITE_OTHER): Payer: Medicare Other | Admitting: Unknown Physician Specialty

## 2018-12-26 VITALS — BP 133/83 | HR 73 | Temp 98.2°F

## 2018-12-26 DIAGNOSIS — L03031 Cellulitis of right toe: Secondary | ICD-10-CM | POA: Diagnosis not present

## 2018-12-26 DIAGNOSIS — I10 Essential (primary) hypertension: Secondary | ICD-10-CM

## 2018-12-26 DIAGNOSIS — M545 Low back pain, unspecified: Secondary | ICD-10-CM

## 2018-12-26 MED ORDER — HYDROCHLOROTHIAZIDE 25 MG PO TABS: 25.0000 mg | ORAL_TABLET | Freq: Every day | ORAL | 1 refills | Status: DC

## 2018-12-26 MED ORDER — MELOXICAM 15 MG PO TABS: 15.0000 mg | ORAL_TABLET | Freq: Every day | ORAL | 0 refills | Status: DC

## 2018-12-26 MED ORDER — SULFAMETHOXAZOLE-TRIMETHOPRIM 800-160 MG PO TABS: 1.0000 | ORAL_TABLET | Freq: Two times a day (BID) | ORAL | 0 refills | Status: DC

## 2018-12-26 NOTE — Patient Instructions (Signed)
Yoga for back pain - Consider chair yoga

## 2018-12-26 NOTE — Progress Notes (Signed)
BP 133/83   Pulse 73   Temp 98.2 F (36.8 C)   SpO2 100%    Subjective:    Patient ID: [name] [name], female    DOB: Jun 14, 1950, 68 y.o.   MRN: [id number]  HPI: [name] [name] is a 68 y.o. female  Chief Complaint  Patient presents with  . Medication Refill    HCTZ  . Hip Pain    Left  . Toe Pain    3 little toes right foot   68 yo with mild dementia here with her husband who is helping with her history is here for the following concerns  Back pain.  States her back is aching, starts in the lower lumbar area.  States pain is going down left leg.  Pain started 3 days ago following a lot of shopping and walking while [place] the beach. No pain while lying down and worse when up walking.  Taking Advil with some relief. No problems urinating.     Pt is also complaining of right lateral 3 toes.  Swollen 3-4 days ago.  Surgery done in the past but does not remember podiatrist.    Needs refill of HCTZ.   BP is stable.  Electrolytes reviewed with GFR of 55. No chest pain or SOB   Relevant past medical, surgical, family and social history reviewed and updated as indicated. Interim medical history since our last visit reviewed. Allergies and medications reviewed and updated.  Review of Systems  Constitutional: Negative for activity change and fatigue.  Respiratory: Negative for chest tightness and shortness of breath.   Cardiovascular: Negative for chest pain.    Per HPI unless specifically indicated above     Objective:    BP 133/83   Pulse 73   Temp 98.2 F (36.8 C)   SpO2 100%   Wt Readings from Last 3 Encounters:  11/09/18 199 lb (90.3 kg)  02/24/18 199 lb (90.3 kg)  01/27/18 201 lb 8 oz (91.4 kg)    Physical Exam Constitutional:      General: She is not in acute distress.    Appearance: Normal appearance. She is well-developed.  HENT:     Head: Normocephalic and atraumatic.  Eyes:     General: Lids are normal. No scleral icterus.       Right eye: No discharge.         Left eye: No discharge.     Conjunctiva/sclera: Conjunctivae normal.  Neck:     Musculoskeletal: Normal range of motion and neck supple.     Vascular: No carotid bruit or JVD.  Cardiovascular:     Rate and Rhythm: Normal rate and regular rhythm.     Heart sounds: Normal heart sounds.  Pulmonary:     Effort: Pulmonary effort is normal.     Breath sounds: Normal breath sounds.  Abdominal:     Palpations: There is no hepatomegaly or splenomegaly.  Musculoskeletal: Normal range of motion.     Lumbar back: She exhibits tenderness. She exhibits normal range of motion, no bony tenderness, no swelling, no edema, no deformity, no laceration, no pain, no spasm and normal pulse.     Comments: Swelling and tenderness lateral 3 toes right foot.  Nails hypertrophic  Skin:    General: Skin is warm and dry.     Coloration: Skin is not pale.     Findings: No rash.  Neurological:     Mental Status: She is alert. Mental status is at baseline.  Coordination: Coordination is intact.     Gait: Gait is intact.     Deep Tendon Reflexes: Reflexes are normal and symmetric.  Psychiatric:        Behavior: Behavior normal.        Thought Content: Thought content normal.        Judgment: Judgment normal.     Results for orders placed or performed in visit on 11/09/18  CBC with Differential/Platelet  Result Value Ref Range   WBC 6.6 3.4 - 10.8 x10E3/uL   RBC 4.98 3.77 - 5.28 x10E6/uL   Hemoglobin 13.8 11.1 - 15.9 g/dL   Hematocrit 14.741.8 82.934.0 - 46.6 %   MCV 84 79 - 97 fL   MCH 27.7 26.6 - 33.0 pg   MCHC 33.0 31.5 - 35.7 g/dL   RDW 56.213.2 13.011.7 - 86.515.4 %   Platelets 252 150 - 450 x10E3/uL   Neutrophils 63 Not Estab. %   Lymphs 24 Not Estab. %   Monocytes 9 Not Estab. %   Eos 3 Not Estab. %   Basos 1 Not Estab. %   Neutrophils Absolute 4.2 1.4 - 7.0 x10E3/uL   Lymphocytes Absolute 1.6 0.7 - 3.1 x10E3/uL   Monocytes Absolute 0.6 0.1 - 0.9 x10E3/uL   EOS (ABSOLUTE) 0.2 0.0 - 0.4 x10E3/uL    Basophils Absolute 0.1 0.0 - 0.2 x10E3/uL   Immature Granulocytes 0 Not Estab. %   Immature Grans (Abs) 0.0 0.0 - 0.1 x10E3/uL  Bayer DCA Hb A1c Waived  Result Value Ref Range   HB A1C (BAYER DCA - WAIVED) 5.2 <7.0 %  Comprehensive metabolic panel  Result Value Ref Range   Glucose 75 65 - 99 mg/dL   BUN 19 8 - 27 mg/dL   Creatinine, Ser 7.841.04 (H) 0.57 - 1.00 mg/dL   GFR calc non Af Amer 55 (L) >59 mL/min/1.73   GFR calc Af Amer 63 >59 mL/min/1.73   BUN/Creatinine Ratio 18 12 - 28   Sodium 141 134 - 144 mmol/L   Potassium 4.0 3.5 - 5.2 mmol/L   Chloride 100 96 - 106 mmol/L   CO2 27 20 - 29 mmol/L   Calcium 9.4 8.7 - 10.3 mg/dL   Total Protein 6.6 6.0 - 8.5 g/dL   Albumin 4.6 3.8 - 4.8 g/dL   Globulin, Total 2.0 1.5 - 4.5 g/dL   Albumin/Globulin Ratio 2.3 (H) 1.2 - 2.2   Bilirubin Total 0.4 0.0 - 1.2 mg/dL   Alkaline Phosphatase 83 39 - 117 IU/L   AST 20 0 - 40 IU/L   ALT 19 0 - 32 IU/L  Lipid Panel w/o Chol/HDL Ratio  Result Value Ref Range   Cholesterol, Total 208 (H) 100 - 199 mg/dL   Triglycerides 696136 0 - 149 mg/dL   HDL 45 >29>39 mg/dL   VLDL Cholesterol Cal 24 5 - 40 mg/dL   LDL Chol Calc (NIH) 528139 (H) 0 - 99 mg/dL  Microalbumin, Urine Waived  Result Value Ref Range   Microalb, Ur Waived 30 (H) 0 - 19 mg/L   Creatinine, Urine Waived 200 10 - 300 mg/dL   Microalb/Creat Ratio <30 <30 mg/g  TSH  Result Value Ref Range   TSH 1.040 0.450 - 4.500 uIU/mL  UA/M w/rflx Culture, Routine   Specimen: Blood   BLD  Result Value Ref Range   Specific Gravity, UA 1.025 1.005 - 1.030   pH, UA 7.0 5.0 - 7.5   Color, UA Yellow Yellow   Appearance Ur Clear  Clear   Leukocytes,UA Negative Negative   Protein,UA Negative Negative/Trace   Glucose, UA Negative Negative   Ketones, UA Trace (A) Negative   RBC, UA Negative Negative   Bilirubin, UA Negative Negative   Urobilinogen, Ur 0.2 0.2 - 1.0 mg/dL   Nitrite, UA Negative Negative  VITAMIN D 25 Hydroxy (Vit-D Deficiency, Fractures)   Result Value Ref Range   Vit D, 25-Hydroxy 33.9 30.0 - 100.0 ng/mL      Assessment & Plan:   Problem List Items Addressed This Visit      Unprioritized   Essential (primary) hypertension    Stable, continue present medications. Refilled HCTZ       Relevant Medications   hydrochlorothiazide (HYDRODIURIL) 25 MG tablet    Other Visit Diagnoses    Cellulitis of toe of right foot    -  Primary   Cellulitis of right 3 smallest toes.  Will refer to podiatry for further management and rx given for Septra BID for 10 days   Relevant Orders   Ambulatory referral to Podiatry   Acute midline low back pain, unspecified whether sciatica present       Low back strain with occasional radiation down leg.  Rx for Meloxicam and they understand not to take with Advil.  Recommended chair yoga   Relevant Medications   meloxicam (MOBIC) 15 MG tablet       Follow up plan: Return if symptoms worsen or fail to improve.

## 2018-12-26 NOTE — Assessment & Plan Note (Signed)
Stable, continue present medications. Refilled HCTZ

## 2018-12-27 ENCOUNTER — Ambulatory Visit: Payer: Self-pay | Admitting: *Deleted

## 2018-12-27 NOTE — Telephone Encounter (Signed)
Husband is calling with concerns the foot is no better. He states the toes have doubled in size and are turning blue/black. Patient is unable to walk on foot without severe pain.  Advised UC/ED for evaluation of toes.  Reason for Disposition . Purple or black skin on toe  (Exception: simple recalled injury with bruise)  Answer Assessment - Initial Assessment Questions 1. ONSET: "When did the pain start?"      Patient was seen in the office yesterday and diagnosed with cellulitis  2. LOCATION: "Where is the pain located?"   (e.g., around nail, entire toe, at foot joint)      R 3 smallest toes 3. PAIN: "How bad is the pain?"    (Scale 1-10; or mild, moderate, severe)   -  MILD (1-3): doesn't interfere with normal activities    -  MODERATE (4-7): interferes with normal activities (e.g., work or school) or awakens from sleep, limping    -  SEVERE (8-10): excruciating pain, unable to do any normal activities, unable to walk     severe 4. APPEARANCE: "What does the toe look like?" (e.g., redness, swelling, bruising, pallor)     Doubled in size from yesterday- turning blue/black 5. CAUSE: "What do you think is causing the toe pain?"     infection  Protocols used: TOE PAIN-A-AH

## 2018-12-27 NOTE — Telephone Encounter (Signed)
Agree with below. Addendum:    I believe that she has limits with moving and including, toileting, bathing, feeding, dressing and grooming. I believe the power wheelchair is needed for pt to be able to perform ADL's in her home

## 2019-01-01 ENCOUNTER — Ambulatory Visit (INDEPENDENT_AMBULATORY_CARE_PROVIDER_SITE_OTHER): Payer: Medicare Other

## 2019-01-01 ENCOUNTER — Other Ambulatory Visit: Payer: Self-pay

## 2019-01-01 ENCOUNTER — Encounter: Payer: Self-pay | Admitting: Podiatry

## 2019-01-01 ENCOUNTER — Ambulatory Visit (INDEPENDENT_AMBULATORY_CARE_PROVIDER_SITE_OTHER): Payer: Medicare Other | Admitting: Podiatry

## 2019-01-01 DIAGNOSIS — L03031 Cellulitis of right toe: Secondary | ICD-10-CM | POA: Diagnosis not present

## 2019-01-01 DIAGNOSIS — L97512 Non-pressure chronic ulcer of other part of right foot with fat layer exposed: Secondary | ICD-10-CM | POA: Diagnosis not present

## 2019-01-01 DIAGNOSIS — S90221A Contusion of right lesser toe(s) with damage to nail, initial encounter: Secondary | ICD-10-CM | POA: Diagnosis not present

## 2019-01-01 DIAGNOSIS — M79674 Pain in right toe(s): Secondary | ICD-10-CM

## 2019-01-02 ENCOUNTER — Other Ambulatory Visit: Payer: Self-pay | Admitting: Family Medicine

## 2019-01-02 ENCOUNTER — Encounter: Payer: Self-pay | Admitting: Podiatry

## 2019-01-02 MED ORDER — ARIPIPRAZOLE 5 MG PO TABS: 5.0000 mg | ORAL_TABLET | Freq: Every day | ORAL | 5 refills | Status: DC

## 2019-01-02 MED ORDER — BUPROPION HCL ER (XL) 300 MG PO TB24: ORAL_TABLET | ORAL | 5 refills | Status: DC

## 2019-01-02 NOTE — Telephone Encounter (Signed)
Requested medication (s) are due for refill today: yes  Requested medication (s) are on the active medication list: yes  Last refill:  12/06/18  Future visit scheduled:yes   Notes to clinic: refill cannot be delegated    Requested Prescriptions  Pending Prescriptions Disp Refills   ARIPiprazole (ABILIFY) 5 MG tablet [Pharmacy Med Name: ARIPIPRAZOLE 5 MG TABLET] 30 tablet 0    Sig: Take 1 tablet (5 mg total) by mouth daily.     Not Delegated - Psychiatry:  Antipsychotics - Second Generation (Atypical) - aripiprazole Failed - 01/02/2019 12:52 PM      Failed - This refill cannot be delegated      Passed - Valid encounter within last 6 months    Recent Outpatient Visits          1 week ago Cellulitis of toe of right foot   [facility] Kathrine [place], NP   1 month ago Flu vaccine need   [facility], [name], DO   10 months ago [employer] annual wellness visit, subsequent   [employer], [place], DO   11 months ago Upper respiratory tract infection, unspecified type   [facility], [place], [name]   1 year ago Major depressive disorder with single episode, in partial remission [facility])   [place], [place], DO      Future Appointments            In 4 months [place], [name], DO [place], [facility]           Signed Prescriptions Disp Refills   hydrOXYzine (ATARAX/VISTARIL) 25 MG tablet 90 tablet 0    Sig: Take 1 tablet (25 mg total) by mouth 3 (three) times daily as needed.     Ear, Nose, and Throat:  Antihistamines Passed - 01/02/2019 12:52 PM      Passed - Valid encounter within last 12 months    Recent Outpatient Visits          1 week ago Cellulitis of toe of right foot   [place], NP   1 month ago Flu vaccine need   [place], [name], DO   10 months ago [employer] annual wellness visit, subsequent   [employer], [place], DO   11 months ago Upper respiratory tract infection, unspecified type   [facility], [place], [name]   1 year ago Major depressive disorder with single episode, in partial remission [facility])   [place], Barb Merino, DO      Future Appointments            In 4 months Wynetta Emery, Barb Merino, DO [employer], [facility]

## 2019-01-02 NOTE — Progress Notes (Signed)
Subjective:  Patient ID: [name] [name], female    DOB: 01-13-1951,  MRN: [id number]  Chief Complaint  Patient presents with  . Toe Pain    Patient presents today for possible infection of right toes 3,4,5 x 2-3 weeks.      68 y.o. female presents for wound care.  Patient presents with right fourth digit laceration with mild erythema surrounding the digit.  Patient states that she has been walking aggressively especially the last couple of weeks.  She denies doing anything else besides seeing the primary care doctor who placed her on Bactrim.  She denies any other acute treatments for this.  She denies seeing any other podiatrist for this.  She has a secondary complaint of right third digit nail contusion with nail loose and partially attached to the underlying nail bed.  She would like to know if this could be removed as is causing her a lot of pain.  She does not recall how this happened but she may have stubbed her toe.  She denies anything else for this or denies doing any other treatment modalities for this.   Review of Systems: Negative except as noted in the HPI. Denies N/V/F/Ch.  Past Medical History:  Diagnosis Date  . Migraines     Current Outpatient Medications:  .  ARIPiprazole (ABILIFY) 5 MG tablet, Take 1 tablet (5 mg total) by mouth daily., Disp: 30 tablet, Rfl: 0 .  buPROPion (WELLBUTRIN XL) 300 MG 24 hr tablet, Take 1 tablet (300 mg total) by mouth daily. CALL TO MAKE AN APPOINTMENT FOR MORE REFILLS, Disp: 30 tablet, Rfl: 0 .  clotrimazole-betamethasone (LOTRISONE) cream, Apply 1 application topically 2 (two) times daily., Disp: 30 g, Rfl: 0 .  donepezil (ARICEPT) 5 MG tablet, Take by mouth., Disp: , Rfl:  .  hydrochlorothiazide (HYDRODIURIL) 25 MG tablet, Take 1 tablet (25 mg total) by mouth daily., Disp: 90 tablet, Rfl: 1 .  hydrOXYzine (ATARAX/VISTARIL) 25 MG tablet, Take 1 tablet (25 mg total) by mouth 3 (three) times daily as needed., Disp: 90 tablet, Rfl: 0 .   meloxicam (MOBIC) 15 MG tablet, Take 1 tablet (15 mg total) by mouth daily., Disp: 14 tablet, Rfl: 0 .  naproxen (NAPROSYN) 500 MG tablet, Take 1 tablet (500 mg total) by mouth 2 (two) times daily with a meal., Disp: 180 tablet, Rfl: 0 .  nystatin (MYCOSTATIN/NYSTOP) powder, Apply topically 4 (four) times daily., Disp: 15 g, Rfl: 0 .  sulfamethoxazole-trimethoprim (BACTRIM DS) 800-160 MG tablet, Take 1 tablet by mouth 2 (two) times daily., Disp: 20 tablet, Rfl: 0 .  venlafaxine XR (EFFEXOR-XR) 150 MG 24 hr capsule, Take 1 capsule (150 mg total) by mouth at bedtime., Disp: 90 capsule, Rfl: 1 .  Vitamin D, Ergocalciferol, (DRISDOL) 1.25 MG (50000 UT) CAPS capsule, Take 1 capsule (50,000 Units total) by mouth every 7 (seven) days., Disp: 12 capsule, Rfl: 0  Social History   Tobacco Use  Smoking Status Never Smoker  Smokeless Tobacco Never Used    Allergies  Allergen Reactions  . Codeine Nausea And Vomiting and Nausea Only  . Tussionex Pennkinetic Er [Hydrocod Polst-Cpm Polst Er] Nausea And Vomiting   Objective:  There were no vitals filed for this visit. There is no height or weight on file to calculate BMI. Constitutional Well developed. Well nourished.  Vascular Dorsalis pedis pulses palpable bilaterally. Posterior tibial pulses palpable bilaterally. Capillary refill normal to all digits.  No cyanosis or clubbing noted. Pedal hair growth normal.  Neurologic  Normal speech. Oriented to person, place, and time. Protective sensation absent  Dermatologic Wound Location: Right fourth digit blister ration with epidermal lysis circumferentially Wound Base: Granular/Healthy Peri-wound: Reddened Exudate: Moderate amount Serous exudate Wound Measurements: -See below  Pain on palpation of the entire/total nail on 3rd digit of the right  Orthopedic: No pain to palpation either foot.   Radiographs: 3 views of skeletally mature adult right foot: Hardware is intact with no loosening.  Good  correction noted.  PIPJ arthrodesis of second and third digit noted.  No cortical irregularity noted are surrounding the fourth digit ruling out osteomyelitis.  No soft tissue emphysema or other any foreign body noted.  No other bony deformities noted. Assessment:   1. Cellulitis of right toe    Plan:  Patient was evaluated and treated and all questions answered.   Nail contusion/dystrophy third toe, right -Patient elects to proceed with minor surgery to remove entire toenail today. Consent reviewed and signed by patient. -Entire/total nail excised. See procedure note. -Educated on post-procedure care including soaking. Written instructions provided and reviewed. -Patient to follow up in 2 weeks for nail check.  Procedure: Excision of entire/total nail  Location: Right 3rd toe digit Anesthesia: Lidocaine 1% plain; 1.5 mL and Marcaine 0.5% plain; 1.5 mL, digital block. Skin Prep: Betadine. Dressing: Silvadene; telfa; dry, sterile, compression dressing. Technique: Following skin prep, the toe was exsanguinated and a tourniquet was secured at the base of the toe. The affected nail border was freed and excised. The tourniquet was then removed and sterile dressing applied. Disposition: Patient tolerated procedure well. Patient to return in 2 weeks for follow-up  Ulcer right fourth digit ulceration -Debridement as below. -Dressed with Betadine wet-to-dry, DSD. -Continue off-loading with surgical shoe.  Procedure: Excisional Debridement of Wound Rationale: Removal of non-viable soft tissue from the wound to promote healing.  Anesthesia: none Pre-Debridement Wound Measurements: 0.6 cm x 0.6 cm x 0.1 cm  Post-Debridement Wound Measurements: 0.5 cm x 0.5 cm x 0.1 cm  Type of Debridement: Sharp Excisional Tissue Removed: Non-viable soft tissue Depth of Debridement: subcutaneous tissue. Technique: Sharp excisional debridement to bleeding, viable wound base.  Dressing: Dry, sterile,  compression dressing. Disposition: Patient tolerated procedure well. Patient to return in 1 week for follow-up.  No follow-ups on file.

## 2019-01-09 ENCOUNTER — Other Ambulatory Visit: Payer: Self-pay

## 2019-01-09 ENCOUNTER — Ambulatory Visit: Payer: Medicare Other | Admitting: Podiatry

## 2019-01-09 DIAGNOSIS — L97512 Non-pressure chronic ulcer of other part of right foot with fat layer exposed: Secondary | ICD-10-CM | POA: Diagnosis not present

## 2019-01-09 DIAGNOSIS — L03031 Cellulitis of right toe: Secondary | ICD-10-CM

## 2019-01-10 ENCOUNTER — Encounter: Payer: Self-pay | Admitting: Podiatry

## 2019-01-10 NOTE — Progress Notes (Signed)
Subjective:  Patient ID: [name] [name], female    DOB: [birth year],  MRN: [id number]  Chief Complaint  Patient presents with  . Foot Pain    pt is here for a f/u of cellulitis of the right 4th toe, pt states that she is doing a lot better since the last time she was here, pt has been keeping nicen and well wrapped around the right 4th toe, and has been applying betadine to the area as well    68 y.o. female presents for wound care.  Patient is following up for right fourth digit ulceration with a cellulitis.  The cellulitis has completely resolved.  Patient has been doing regular dressing changes with Betadine wet-to-dry dressings.  The wound has considerably healed and decreased in size.  Patient states she is doing a lot better.  She has been ambulating with regular sneakers at this time.  Patient denies any other nausea fever chills vomiting.  Patient has completed her antibiotic therapy as well.   Review of Systems: Negative except as noted in the HPI. Denies N/V/F/Ch.  Past Medical History:  Diagnosis Date  . Migraines     Current Outpatient Medications:  .  ARIPiprazole (ABILIFY) 5 MG tablet, Take 1 tablet (5 mg total) by mouth daily., Disp: 30 tablet, Rfl: 5 .  buPROPion (WELLBUTRIN XL) 300 MG 24 hr tablet, Take 1 tablet (300 mg total) by mouth daily., Disp: 30 tablet, Rfl: 5 .  clotrimazole-betamethasone (LOTRISONE) cream, Apply 1 application topically 2 (two) times daily., Disp: 30 g, Rfl: 0 .  donepezil (ARICEPT) 5 MG tablet, Take by mouth., Disp: , Rfl:  .  hydrochlorothiazide (HYDRODIURIL) 25 MG tablet, Take 1 tablet (25 mg total) by mouth daily., Disp: 90 tablet, Rfl: 1 .  hydrOXYzine (ATARAX/VISTARIL) 25 MG tablet, Take 1 tablet (25 mg total) by mouth 3 (three) times daily as needed., Disp: 90 tablet, Rfl: 0 .  meloxicam (MOBIC) 15 MG tablet, Take 1 tablet (15 mg total) by mouth daily., Disp: 14 tablet, Rfl: 0 .  naproxen (NAPROSYN) 500 MG tablet, Take 1 tablet (500 mg  total) by mouth 2 (two) times daily with a meal., Disp: 180 tablet, Rfl: 0 .  nystatin (MYCOSTATIN/NYSTOP) powder, Apply topically 4 (four) times daily., Disp: 15 g, Rfl: 0 .  sulfamethoxazole-trimethoprim (BACTRIM DS) 800-160 MG tablet, Take 1 tablet by mouth 2 (two) times daily., Disp: 20 tablet, Rfl: 0 .  venlafaxine XR (EFFEXOR-XR) 150 MG 24 hr capsule, Take 1 capsule (150 mg total) by mouth at bedtime., Disp: 90 capsule, Rfl: 1 .  Vitamin D, Ergocalciferol, (DRISDOL) 1.25 MG (50000 UT) CAPS capsule, Take 1 capsule (50,000 Units total) by mouth every 7 (seven) days., Disp: 12 capsule, Rfl: 0  Social History   Tobacco Use  Smoking Status Never Smoker  Smokeless Tobacco Never Used    Allergies  Allergen Reactions  . Codeine Nausea And Vomiting and Nausea Only  . Tussionex Pennkinetic Er [Hydrocod Polst-Cpm Polst Er] Nausea And Vomiting   Objective:  There were no vitals filed for this visit. There is no height or weight on file to calculate BMI. Constitutional Well developed. Well nourished.  Vascular Dorsalis pedis pulses palpable bilaterally. Posterior tibial pulses palpable bilaterally. Capillary refill normal to all digits.  No cyanosis or clubbing noted. Pedal hair growth normal.  Neurologic Normal speech. Oriented to person, place, and time. Protective sensation absent  Dermatologic Wound Location: Right fourth digit ulceration as described below. Wound Base: Granular/Healthy Peri-wound: Reddened Exudate: Moderate  amount Serous exudate Wound Measurements: -See below  Third digit toenail has healed really well.  Orthopedic: No pain to palpation either foot.   Radiographs: None Assessment:   1. Cellulitis of right toe   2. Chronic toe ulcer, right, with fat layer exposed (HCC)    Plan:  Patient was evaluated and treated and all questions answered.  Right third digit total nail avulsion -Resolved  Ulcer right fourth digit ulceration improving -Debridement as  below. -Dressed with Betadine wet-to-dry, DSD. -Continue off-loading with surgical shoe.  Given that patient has a contracture of the toe is putting a lot of pressure to that area I believe patient may benefit from a flexor tenotomy.  If the wound is healing well, I will consider doing a flexor tenotomy during next office visit.  Procedure: Excisional Debridement of Wound Rationale: Removal of non-viable soft tissue from the wound to promote healing.  Anesthesia: none Pre-Debridement Wound Measurements: 0.5 cm x 0.5 cm x 0.1 cm  Post-Debridement Wound Measurements: 0.4 cm x 0.4 cm x 0.1 cm Type of Debridement: Sharp Excisional Tissue Removed: Non-viable soft tissue Depth of Debridement: subcutaneous tissue. Technique: Sharp excisional debridement to bleeding, viable wound base.  Dressing: Dry, sterile, compression dressing. Disposition: Patient tolerated procedure well. Patient to return in 1 week for follow-up.  Return in about 1 week (around 01/16/2019).

## 2019-01-16 ENCOUNTER — Ambulatory Visit: Payer: Medicare Other | Admitting: Podiatry

## 2019-01-16 ENCOUNTER — Other Ambulatory Visit: Payer: Self-pay

## 2019-01-16 DIAGNOSIS — L97512 Non-pressure chronic ulcer of other part of right foot with fat layer exposed: Secondary | ICD-10-CM

## 2019-01-16 DIAGNOSIS — M205X1 Other deformities of toe(s) (acquired), right foot: Secondary | ICD-10-CM

## 2019-01-16 DIAGNOSIS — L03031 Cellulitis of right toe: Secondary | ICD-10-CM | POA: Diagnosis not present

## 2019-01-18 ENCOUNTER — Encounter: Payer: Self-pay | Admitting: Podiatry

## 2019-01-18 NOTE — Progress Notes (Signed)
Subjective:  Patient ID: [name] [name], female    DOB: 21-Apr-1950,  MRN: [id number]  Chief Complaint  Patient presents with  . Foot Pain    pt is here for right foot cellulitis f/u, pt states that her right foot fourth toe is peeling and is still red, pt also states that she is doing better since the last time she was here    68 y.o. female presents for wound care.  Patient is present with follow-up of right fourth digit ulceration.  Patient states looking a lot better.  She has been doing regular dressing changes.  She states that there is still a small tiny wound left.  She denies any other acute complaints.  She has been ambulating with regular sneakers.   Review of Systems: Negative except as noted in the HPI. Denies N/V/F/Ch.  Past Medical History:  Diagnosis Date  . Migraines     Current Outpatient Medications:  .  ARIPiprazole (ABILIFY) 5 MG tablet, Take 1 tablet (5 mg total) by mouth daily., Disp: 30 tablet, Rfl: 5 .  buPROPion (WELLBUTRIN XL) 300 MG 24 hr tablet, Take 1 tablet (300 mg total) by mouth daily., Disp: 30 tablet, Rfl: 5 .  clotrimazole-betamethasone (LOTRISONE) cream, Apply 1 application topically 2 (two) times daily., Disp: 30 g, Rfl: 0 .  donepezil (ARICEPT) 5 MG tablet, Take by mouth., Disp: , Rfl:  .  hydrochlorothiazide (HYDRODIURIL) 25 MG tablet, Take 1 tablet (25 mg total) by mouth daily., Disp: 90 tablet, Rfl: 1 .  hydrOXYzine (ATARAX/VISTARIL) 25 MG tablet, Take 1 tablet (25 mg total) by mouth 3 (three) times daily as needed., Disp: 90 tablet, Rfl: 0 .  meloxicam (MOBIC) 15 MG tablet, Take 1 tablet (15 mg total) by mouth daily., Disp: 14 tablet, Rfl: 0 .  naproxen (NAPROSYN) 500 MG tablet, Take 1 tablet (500 mg total) by mouth 2 (two) times daily with a meal., Disp: 180 tablet, Rfl: 0 .  nystatin (MYCOSTATIN/NYSTOP) powder, Apply topically 4 (four) times daily., Disp: 15 g, Rfl: 0 .  sulfamethoxazole-trimethoprim (BACTRIM DS) 800-160 MG tablet, Take 1  tablet by mouth 2 (two) times daily., Disp: 20 tablet, Rfl: 0 .  venlafaxine XR (EFFEXOR-XR) 150 MG 24 hr capsule, Take 1 capsule (150 mg total) by mouth at bedtime., Disp: 90 capsule, Rfl: 1 .  Vitamin D, Ergocalciferol, (DRISDOL) 1.25 MG (50000 UT) CAPS capsule, Take 1 capsule (50,000 Units total) by mouth every 7 (seven) days., Disp: 12 capsule, Rfl: 0  Social History   Tobacco Use  Smoking Status Never Smoker  Smokeless Tobacco Never Used    Allergies  Allergen Reactions  . Codeine Nausea And Vomiting and Nausea Only  . Tussionex Pennkinetic Er [Hydrocod Polst-Cpm Polst Er] Nausea And Vomiting   Objective:  There were no vitals filed for this visit. There is no height or weight on file to calculate BMI. Constitutional Well developed. Well nourished.  Vascular Dorsalis pedis pulses palpable bilaterally. Posterior tibial pulses palpable bilaterally. Capillary refill normal to all digits.  No cyanosis or clubbing noted. Pedal hair growth normal.  Neurologic Normal speech. Oriented to person, place, and time. Protective sensation absent  Dermatologic Wound Location: Right fourth digit ulceration as described below. Wound Base: Granular/Healthy Peri-wound: Reddened Exudate: Moderate amount Serous exudate Wound Measurements: -See below  Third digit toenail has healed really well.  Orthopedic: No pain to palpation either foot. Semi-reducible hammertoe deformity right fourth digit.   Radiographs: None Assessment:   1. Cellulitis of right toe  2. Chronic toe ulcer, right, with fat layer exposed ([place])   3. Contracture of toe, right    Plan:  Patient was evaluated and treated and all questions answered.  Right third digit total nail avulsion -Resolved  Ulcer right fourth digit ulceration improving -Debridement as below. -Dressed with Betadine wet-to-dry, DSD. -Continue off-loading with surgical shoe.  Given that patient has a contracture of the toe is putting a lot  of pressure to that area I believe patient may benefit from a flexor tenotomy.  If the wound is healing well, I will consider doing a flexor tenotomy during next office visit.  Procedure: Excisional Debridement of Wound Rationale: Removal of non-viable soft tissue from the wound to promote healing.  Anesthesia: none Pre-Debridement Wound Measurements: 0.4 cm x 0.4 cm x 0.1 cm Post-Debridement Wound Measurements: 0.3 cm x 0.3 cm x 0.1 cm Type of Debridement: Sharp Excisional Tissue Removed: Non-viable soft tissue Depth of Debridement: subcutaneous tissue. Technique: Sharp excisional debridement to bleeding, viable wound base.  Dressing: Dry, sterile, compression dressing. Disposition: Patient tolerated procedure well. Patient to return in 1 week for follow-up.  Hammertoe Right fourth digit with pre-ulcerative callus -Flexor tenotomy as below. -Advised to remove the dressing in 24 hours and apply a band-aid and triple abx ointment every day thereafter.  Procedure: Flexor Tenotomy Indication for Procedure: toe with semi-reducible hammertoe with distal tip ulceration. Flexor tenotomy indicated to alleviate contracture, reduce pressure, and enhance healing of the ulceration. Location: right, 4th toe Anesthesia: Lidocaine 1% plain; 1.5 mL and Marcaine 0.5% plain; 1.5 mL digital block Instrumentation: 18 g needle  Technique: The toe was anesthetized as above and prepped in the usual fashion. The toe was exsanquinated and a tourniquet was secured at the base of the toe. An 11 blade was then used to percutaneously release the flexor tendon at the plantar surface of the toe with noted release of the hammertoe deformity.  3-0 Prolene was used to close the incision site.  The incision was then dressed with antibiotic ointment and band-aid. Compression splint dressing applied. Patient tolerated the procedure well. Dressing: Dry, sterile, compression dressing. Disposition: Patient tolerated procedure  well. Patient to return in 1 week for follow-up.      No follow-ups on file.     No follow-ups on file.

## 2019-01-29 ENCOUNTER — Other Ambulatory Visit: Payer: Self-pay | Admitting: Family Medicine

## 2019-01-30 ENCOUNTER — Encounter: Payer: Self-pay | Admitting: Podiatry

## 2019-01-30 ENCOUNTER — Other Ambulatory Visit: Payer: Self-pay

## 2019-01-30 ENCOUNTER — Ambulatory Visit: Payer: Medicare Other | Admitting: Podiatry

## 2019-01-30 DIAGNOSIS — M205X1 Other deformities of toe(s) (acquired), right foot: Secondary | ICD-10-CM

## 2019-01-30 DIAGNOSIS — L97512 Non-pressure chronic ulcer of other part of right foot with fat layer exposed: Secondary | ICD-10-CM

## 2019-01-30 DIAGNOSIS — M79674 Pain in right toe(s): Secondary | ICD-10-CM

## 2019-01-30 NOTE — Progress Notes (Signed)
Subjective:  Patient ID: [name] [name], female    DOB: 1950/11/09,  MRN: [id number]  Chief Complaint  Patient presents with  . Foot Pain    pt has cellulitis of the right 3rd toe, pt also states that she is doing alot better since the last time she was here, pt also states that she has no other comments or concerns    68 y.o. female presents for wound care.  Patient is present with follow-up of right fourth digit ulceration.  Patient states looking a lot better.  She has been doing regular dressing changes.  She states that there is still a small tiny wound left.  She denies any other acute complaints.  She has been ambulating with regular sneakers.   Review of Systems: Negative except as noted in the HPI. Denies N/V/F/Ch.  Past Medical History:  Diagnosis Date  . Migraines     Current Outpatient Medications:  .  ARIPiprazole (ABILIFY) 5 MG tablet, Take 1 tablet (5 mg total) by mouth daily., Disp: 30 tablet, Rfl: 5 .  buPROPion (WELLBUTRIN XL) 300 MG 24 hr tablet, Take 1 tablet (300 mg total) by mouth daily., Disp: 30 tablet, Rfl: 5 .  clotrimazole-betamethasone (LOTRISONE) cream, Apply 1 application topically 2 (two) times daily., Disp: 30 g, Rfl: 0 .  donepezil (ARICEPT) 5 MG tablet, Take by mouth., Disp: , Rfl:  .  hydrochlorothiazide (HYDRODIURIL) 25 MG tablet, Take 1 tablet (25 mg total) by mouth daily., Disp: 90 tablet, Rfl: 1 .  hydrOXYzine (ATARAX/VISTARIL) 25 MG tablet, Take 1 tablet (25 mg total) by mouth 3 (three) times daily as needed., Disp: 90 tablet, Rfl: 0 .  meloxicam (MOBIC) 15 MG tablet, Take 1 tablet (15 mg total) by mouth daily., Disp: 14 tablet, Rfl: 0 .  naproxen (NAPROSYN) 500 MG tablet, Take 1 tablet (500 mg total) by mouth 2 (two) times daily with a meal., Disp: 180 tablet, Rfl: 0 .  nystatin (MYCOSTATIN/NYSTOP) powder, Apply topically 4 (four) times daily., Disp: 15 g, Rfl: 0 .  sulfamethoxazole-trimethoprim (BACTRIM DS) 800-160 MG tablet, Take 1 tablet by  mouth 2 (two) times daily., Disp: 20 tablet, Rfl: 0 .  venlafaxine XR (EFFEXOR-XR) 150 MG 24 hr capsule, Take 1 capsule (150 mg total) by mouth at bedtime., Disp: 90 capsule, Rfl: 1 .  Vitamin D, Ergocalciferol, (DRISDOL) 1.25 MG (50000 UT) CAPS capsule, Take 1 capsule (50,000 Units total) by mouth every 7 (seven) days., Disp: 12 capsule, Rfl: 0  Social History   Tobacco Use  Smoking Status Never Smoker  Smokeless Tobacco Never Used    Allergies  Allergen Reactions  . Codeine Nausea And Vomiting and Nausea Only  . Tussionex Pennkinetic Er [Hydrocod Polst-Cpm Polst Er] Nausea And Vomiting   Objective:  There were no vitals filed for this visit. There is no height or weight on file to calculate BMI. Constitutional Well developed. Well nourished.  Vascular Dorsalis pedis pulses palpable bilaterally. Posterior tibial pulses palpable bilaterally. Capillary refill normal to all digits.  No cyanosis or clubbing noted. Pedal hair growth normal.  Neurologic Normal speech. Oriented to person, place, and time. Protective sensation absent  Dermatologic Wound Location: Right fourth digit ulceration as described below. Wound Base: Granular/Healthy Peri-wound: Reddened Exudate: Moderate amount Serous exudate Wound Measurements: -See below  Third digit toenail has healed really well.  Orthopedic: No pain to palpation either foot. Semi-reducible hammertoe deformity right fourth digit.   Radiographs: None Assessment:   No diagnosis found. Plan:  Patient was  evaluated and treated and all questions answered.  Right third digit total nail avulsion -Resolved  Ulcer right fourth digit ulceration improving -Primarily healed.  Upon debridement no further ulceration noted.  Flexor tenotomy secondary to hammertoe contracture of the fourth digit -The sutures were removed from the flexor tenotomy performed during last clinic visit.  Good reduction of hammertoe contracture noted.  No clinical  signs of infection noted.        No follow-ups on file.     No follow-ups on file.

## 2019-01-31 ENCOUNTER — Other Ambulatory Visit: Payer: Self-pay | Admitting: Family Medicine

## 2019-02-26 ENCOUNTER — Other Ambulatory Visit: Payer: Self-pay | Admitting: Family Medicine

## 2019-03-26 ENCOUNTER — Other Ambulatory Visit: Payer: Self-pay | Admitting: Family Medicine

## 2019-03-26 NOTE — Telephone Encounter (Signed)
Requested Prescriptions  Pending Prescriptions Disp Refills  . hydrOXYzine (ATARAX/VISTARIL) 25 MG tablet [Pharmacy Med Name: HYDROXYZINE HCL 25 MG TABLET] 90 tablet 0    Sig: Take 1 tablet (25 mg total) by mouth 3 (three) times daily as needed.     Ear, Nose, and Throat:  Antihistamines Passed - 03/26/2019  1:52 PM      Passed - Valid encounter within last 12 months    Recent Outpatient Visits          3 months ago Cellulitis of toe of right foot   [facility] Gabriel Cirri, NP   4 months ago Flu vaccine need   [facility] [place], [place], DO   1 year ago Medicare annual wellness visit, subsequent   [facility] [place], [place], DO   1 year ago Upper respiratory tract infection, unspecified type   [facility], [place]ley Hews, [place]   1 year ago Major depressive disorder with single episode, in partial remission [facility])   [facility] [place], Oralia Rud, DO      Future Appointments            In 1 week  [facility]   In 1 month [place], Oralia Rud, DO [facility], [facility]

## 2019-04-04 ENCOUNTER — Ambulatory Visit (INDEPENDENT_AMBULATORY_CARE_PROVIDER_SITE_OTHER): Payer: Medicare PPO

## 2019-04-04 ENCOUNTER — Other Ambulatory Visit: Payer: Self-pay | Admitting: Family Medicine

## 2019-04-04 VITALS — BP 135/88 | HR 75

## 2019-04-04 DIAGNOSIS — Z0000 Encounter for general adult medical examination without abnormal findings: Secondary | ICD-10-CM | POA: Diagnosis not present

## 2019-04-04 DIAGNOSIS — Z1231 Encounter for screening mammogram for malignant neoplasm of breast: Secondary | ICD-10-CM

## 2019-04-04 NOTE — Patient Instructions (Signed)
[name] , Thank you for taking time to come for your Medicare Wellness Visit. I appreciate your ongoing commitment to your health goals. Please review the following plan we discussed and let me know if I can assist you in the future.   Screening recommendations/referrals: Colonoscopy: up to date Mammogram: Please call [phone] to schedule your mammogram.  Bone Density: up to date Recommended yearly ophthalmology/optometry visit for glaucoma screening and checkup Recommended yearly dental visit for hygiene and checkup  Vaccinations: Influenza vaccine: up to date Pneumococcal vaccine:  Up to date  Tdap vaccine: up to date Shingles vaccine: shingrix eligible    Covid-19: We are recommending the vaccine to everyone who has not had an allergic reaction to any of the components of the vaccine. If you have specific questions about the vaccine, please bring them up with your health care provider to discuss them.   We will likely not be getting the vaccine in the office for the first rounds of vaccinations. The way they are releasing the vaccines is going to be through the health systems (like [place], [facility], [facility], [facility]), through your county health department, or through the pharmacies.   The [facility] Department is giving vaccines to those 65+ and Health Care Workers Teachers and Child Care providers start 03/28/19 Call [phone] to schedule  If you are 65+ you can get a vaccine through [facility] by signing up for an appointment.  You can sign up by going to: [email or web address].  You can get more information by going to: [email or web address]  [employer] next door is giving the [employer]- you can call [phone] or stop by there to schedule.  Advanced directives: Please bring a copy of your health care power of attorney and living will to the office at your convenience.  Conditions/risks identified: Recommend to exercise for at least 150  minutes per week.   Next appointment: Follow up in one year for your annual wellness visit    Preventive Care 65 Years and Older, Female Preventive care refers to lifestyle choices and visits with your health care provider that can promote health and wellness. What does preventive care include?  A yearly physical exam. This is also called an annual well check.  Dental exams once or twice a year.  Routine eye exams. Ask your health care provider how often you should have your eyes checked.  Personal lifestyle choices, including:  Daily care of your teeth and gums.  Regular physical activity.  Eating a healthy diet.  Avoiding tobacco and drug use.  Limiting alcohol use.  Practicing safe sex.  Taking low-dose aspirin every day.  Taking vitamin and mineral supplements as recommended by your health care provider. What happens during an annual well check? The services and screenings done by your health care provider during your annual well check will depend on your age, overall health, lifestyle risk factors, and family history of disease. Counseling  Your health care provider may ask you questions about your:  Alcohol use.  Tobacco use.  Drug use.  Emotional well-being.  Home and relationship well-being.  Sexual activity.  Eating habits.  History of falls.  Memory and ability to understand (cognition).  Work and work Astronomer.  Reproductive health. Screening  You may have the following tests or measurements:  Height, weight, and BMI.  Blood pressure.  Lipid and cholesterol levels. These may be checked every 5 years, or more frequently if you are over 26 years old.  Skin check.  Lung  cancer screening. You may have this screening every year starting at age 39 if you have a 30-pack-year history of smoking and currently smoke or have quit within the past 15 years.  Fecal occult blood test (FOBT) of the stool. You may have this test every year starting  at age 18.  Flexible sigmoidoscopy or colonoscopy. You may have a sigmoidoscopy every 5 years or a colonoscopy every 10 years starting at age 58.  Hepatitis C blood test.  Hepatitis B blood test.  Sexually transmitted disease (STD) testing.  Diabetes screening. This is done by checking your blood sugar (glucose) after you have not eaten for a while (fasting). You may have this done every 1-3 years.  Bone density scan. This is done to screen for osteoporosis. You may have this done starting at age 63.  Mammogram. This may be done every 1-2 years. Talk to your health care provider about how often you should have regular mammograms. Talk with your health care provider about your test results, treatment options, and if necessary, the need for more tests. Vaccines  Your health care provider may recommend certain vaccines, such as:  Influenza vaccine. This is recommended every year.  Tetanus, diphtheria, and acellular pertussis (Tdap, Td) vaccine. You may need a Td booster every 10 years.  Zoster vaccine. You may need this after age 77.  Pneumococcal 13-valent conjugate (PCV13) vaccine. One dose is recommended after age 56.  Pneumococcal polysaccharide (PPSV23) vaccine. One dose is recommended after age 35. Talk to your health care provider about which screenings and vaccines you need and how often you need them. This information is not intended to replace advice given to you by your health care provider. Make sure you discuss any questions you have with your health care provider. Document Released: 02/14/2015 Document Revised: 10/08/2015 Document Reviewed: 11/19/2014 Elsevier Interactive Patient Education  2017 [employer].  Fall Prevention in the Home Falls can cause injuries. They can happen to people of all ages. There are many things you can do to make your home safe and to help prevent falls. What can I do on the outside of my home?  Regularly fix the edges of walkways and  driveways and fix any cracks.  Remove anything that might make you trip as you walk through a door, such as a raised step or threshold.  Trim any bushes or trees on the path to your home.  Use bright outdoor lighting.  Clear any walking paths of anything that might make someone trip, such as rocks or tools.  Regularly check to see if handrails are loose or broken. Make sure that both sides of any steps have handrails.  Any raised decks and porches should have guardrails on the edges.  Have any leaves, snow, or ice cleared regularly.  Use sand or salt on walking paths during winter.  Clean up any spills in your garage right away. This includes oil or grease spills. What can I do in the bathroom?  Use night lights.  Install grab bars by the toilet and in the tub and shower. Do not use towel bars as grab bars.  Use non-skid mats or decals in the tub or shower.  If you need to sit down in the shower, use a plastic, non-slip stool.  Keep the floor dry. Clean up any water that spills on the floor as soon as it happens.  Remove soap buildup in the tub or shower regularly.  Attach bath mats securely with double-sided non-slip rug  tape.  Do not have throw rugs and other things on the floor that can make you trip. What can I do in the bedroom?  Use night lights.  Make sure that you have a light by your bed that is easy to reach.  Do not use any sheets or blankets that are too big for your bed. They should not hang down onto the floor.  Have a firm chair that has side arms. You can use this for support while you get dressed.  Do not have throw rugs and other things on the floor that can make you trip. What can I do in the kitchen?  Clean up any spills right away.  Avoid walking on wet floors.  Keep items that you use a lot in easy-to-reach places.  If you need to reach something above you, use a strong step stool that has a grab bar.  Keep electrical cords out of the  way.  Do not use floor polish or wax that makes floors slippery. If you must use wax, use non-skid floor wax.  Do not have throw rugs and other things on the floor that can make you trip. What can I do with my stairs?  Do not leave any items on the stairs.  Make sure that there are handrails on both sides of the stairs and use them. Fix handrails that are broken or loose. Make sure that handrails are as long as the stairways.  Check any carpeting to make sure that it is firmly attached to the stairs. Fix any carpet that is loose or worn.  Avoid having throw rugs at the top or bottom of the stairs. If you do have throw rugs, attach them to the floor with carpet tape.  Make sure that you have a light switch at the top of the stairs and the bottom of the stairs. If you do not have them, ask someone to add them for you. What else can I do to help prevent falls?  Wear shoes that:  Do not have high heels.  Have rubber bottoms.  Are comfortable and fit you well.  Are closed at the toe. Do not wear sandals.  If you use a stepladder:  Make sure that it is fully opened. Do not climb a closed stepladder.  Make sure that both sides of the stepladder are locked into place.  Ask someone to hold it for you, if possible.  Clearly mark and make sure that you can see:  Any grab bars or handrails.  First and last steps.  Where the edge of each step is.  Use tools that help you move around (mobility aids) if they are needed. These include:  Canes.  Walkers.  Scooters.  Crutches.  Turn on the lights when you go into a dark area. Replace any light bulbs as soon as they burn out.  Set up your furniture so you have a clear path. Avoid moving your furniture around.  If any of your floors are uneven, fix them.  If there are any pets around you, be aware of where they are.  Review your medicines with your doctor. Some medicines can make you feel dizzy. This can increase your chance  of falling. Ask your doctor what other things that you can do to help prevent falls. This information is not intended to replace advice given to you by your health care provider. Make sure you discuss any questions you have with your health care provider. Document Released: 11/14/2008  Document Revised: 06/26/2015 Document Reviewed: 02/22/2014 Elsevier Interactive Patient Education  2017 [employer].

## 2019-04-04 NOTE — Progress Notes (Signed)
Subjective:   [name] [name] is a 69 y.o. female who presents for Medicare Annual (Subsequent) preventive examination.  This visit is being conducted via phone call  - after an attmept to do on video chat - due to the COVID-19 pandemic. This patient has given me verbal consent via phone to conduct this visit, patient states they are participating from their home address. Some vital signs may be absent or patient reported.   Patient identification: identified by name, DOB, and current address.    Review of Systems:   Cardiac Risk Factors include: advanced age (>68men, >54 women);hypertension     Objective:     Vitals: BP 135/88   Pulse 75   There is no height or weight on file to calculate BMI.  Advanced Directives 04/04/2019 01/07/2016 01/06/2015 07/23/2014 06/25/2014  Does Patient Have a Medical Advance Directive? Yes Yes No No No  Type of Advance Directive Living will;Healthcare Power of [employer] Power of [place];Living will - - -  Does patient want to make changes to medical advance directive? - - Yes - information given - -  Copy of Healthcare Power of Attorney in Chart? No - copy requested No - copy requested No - copy requested - -  Would patient like information on creating a medical advance directive? - - Yes - Educational materials given Yes English as a second language teacher given No - patient declined information    Tobacco Social History   Tobacco Use  Smoking Status Never Smoker  Smokeless Tobacco Never Used     Counseling given: Not Answered   Clinical Intake:  Pre-visit preparation completed: Yes  Pain : No/denies pain                 Past Medical History:  Diagnosis Date  . Hypertension   . Migraines    Past Surgical History:  Procedure Laterality Date  . ABDOMINAL HYSTERECTOMY    . CARPAL TUNNEL RELEASE    . CESAREAN SECTION    . CHOLECYSTECTOMY    . HAMMER TOE SURGERY Bilateral    Family History  Problem Relation Age of Onset  .  Heart disease Mother   . Heart attack Father   . Hypertension Father   . Cancer Paternal Aunt   . Breast cancer Paternal Aunt    Social History   Socioeconomic History  . Marital status: Married    Spouse name: Not on file  . Number of children: Not on file  . Years of education: Not on file  . Highest education level: Not on file  Occupational History  . Not on file  Tobacco Use  . Smoking status: Never Smoker  . Smokeless tobacco: Never Used  Substance and Sexual Activity  . Alcohol use: No    Alcohol/week: 0.0 standard drinks  . Drug use: No  . Sexual activity: Not Currently  Other Topics Concern  . Not on file  Social History Narrative   No exercise   Social Determinants of Health   Financial Resource Strain:   . Difficulty of Paying Living Expenses: Not on file  Food Insecurity:   . Worried About Programme researcher, broadcasting/film/video in the Last Year: Not on file  . Ran Out of Food in the Last Year: Not on file  Transportation Needs:   . Lack of Transportation (Medical): Not on file  . Lack of Transportation (Non-Medical): Not on file  Physical Activity: Inactive  . Days of Exercise per Week: 0 days  . Minutes  of Exercise per Session: 0 min  Stress:   . Feeling of Stress : Not on file  Social Connections: Slightly Isolated  . Frequency of Communication with Friends and Family: More than three times a week  . Frequency of Social Gatherings with Friends and Family: Twice a week  . Attends Religious Services: More than 4 times per year  . Active Member of Clubs or Organizations: No  . Attends Banker Meetings: Never  . Marital Status: Married    Outpatient Encounter Medications as of 04/04/2019  Medication Sig  . ARIPiprazole (ABILIFY) 5 MG tablet Take 1 tablet (5 mg total) by mouth daily.  Marland Kitchen buPROPion (WELLBUTRIN XL) 300 MG 24 hr tablet Take 1 tablet (300 mg total) by mouth daily.  . clotrimazole-betamethasone (LOTRISONE) cream Apply 1 application topically 2  (two) times daily.  Marland Kitchen donepezil (ARICEPT) 5 MG tablet Take by mouth.  . hydrochlorothiazide (HYDRODIURIL) 25 MG tablet Take 1 tablet (25 mg total) by mouth daily.  . hydrOXYzine (ATARAX/VISTARIL) 25 MG tablet Take 1 tablet (25 mg total) by mouth 3 (three) times daily as needed.  . naproxen (NAPROSYN) 500 MG tablet Take 1 tablet (500 mg total) by mouth 2 (two) times daily with a meal.  . nystatin (MYCOSTATIN/NYSTOP) powder Apply topically 4 (four) times daily.  Marland Kitchen sulfamethoxazole-trimethoprim (BACTRIM DS) 800-160 MG tablet Take 1 tablet by mouth 2 (two) times daily.  Marland Kitchen venlafaxine XR (EFFEXOR-XR) 150 MG 24 hr capsule Take 1 capsule (150 mg total) by mouth at bedtime.  . Vitamin D, Ergocalciferol, (DRISDOL) 1.25 MG (50000 UT) CAPS capsule Take 1 capsule (50,000 Units total) by mouth every 7 (seven) days.  . [DISCONTINUED] meloxicam (MOBIC) 15 MG tablet Take 1 tablet (15 mg total) by mouth daily. (Patient not taking: Reported on 04/04/2019)   No facility-administered encounter medications on file as of 04/04/2019.    Activities of Daily Living In your present state of health, do you have any difficulty performing the following activities: 04/04/2019  Hearing? Y  Comment hearing aids  Vision? N  Comment reading glasses, [facility]  Difficulty concentrating or making decisions? Y  Walking or climbing stairs? N  Dressing or bathing? N  Doing errands, shopping? Y  Comment husband helps  Quarry manager and eating ? N  Using the Toilet? N  In the past six months, have you accidently leaked urine? N  Do you have problems with loss of bowel control? N  Managing your Medications? Y  Comment husband does  Managing your Finances? Y  Comment husband helps  Housekeeping or managing your Housekeeping? N  Some recent data might be hidden    Patient Care Team: Dorcas Carrow, DO as PCP - General (Family Medicine) Morene Crocker, MD as Referring Physician (Neurology)    Assessment:    This is a routine wellness examination for [name] [name].  Exercise Activities and Dietary recommendations Current Exercise Habits: The patient does not participate in regular exercise at present, Exercise limited by: None identified  Goals Addressed   None     Fall Risk: Fall Risk  04/04/2019 11/09/2018 02/24/2018 01/19/2017 01/19/2017  Falls in the past year? 0 0 0 Yes No  Number falls in past yr: 0 0 - 2 or more -  Injury with Fall? 0 0 - Yes -  Risk for fall due to : - - - History of fall(s);Impaired balance/gait -    FALL RISK PREVENTION PERTAINING TO THE HOME:  Any stairs in or  around the home? Yes  basement  If so, are there any without handrails? No   Home free of loose throw rugs in walkways, pet beds, electrical cords, etc? No  Adequate lighting in your home to reduce risk of falls? no  ASSISTIVE DEVICES UTILIZED TO PREVENT FALLS:  Life alert? No  Use of a cane, walker or w/c? No  Grab bars in the bathroom? No  Shower chair or bench in shower? No  Elevated toilet seat or a handicapped toilet? No   DME ORDERS:  DME order needed?  No   TIMED UP AND GO:  Unable to perform    Depression Screen PHQ 2/9 Scores 04/04/2019 11/09/2018 02/24/2018 11/24/2017  PHQ - 2 Score 5 3 3 2   PHQ- 9 Score 15 5 12 4      Cognitive Function     6CIT Screen 02/24/2018 01/19/2017 01/07/2016  What Year? 0 points 0 points 0 points  What month? 0 points 0 points 0 points  What time? 0 points 0 points 0 points  Count back from 20 0 points 0 points 0 points  Months in reverse 0 points 0 points 0 points  Repeat phrase 6 points 2 points 2 points  Total Score 6 2 2     Immunization History  Administered Date(s) Administered  . Fluad Quad(high Dose 65+) 11/09/2018  . Influenza, High Dose Seasonal PF 01/07/2016, 10/07/2017  . Influenza,inj,Quad PF,6+ Mos 10/18/2014  . Influenza-Unspecified 11/04/2013, 01/12/2017, 01/17/2017  . Pneumococcal Conjugate-13 01/06/2015  . Pneumococcal  Polysaccharide-23 01/07/2016  . Tdap 08/13/2015  . Zoster 03/05/2010    Qualifies for Shingles Vaccine? Yes  Zostavax completed 2012. Due for Shingrix. Education has been provided regarding the importance of this vaccine. Pt has been advised to call insurance company to determine out of pocket expense. Advised may also receive vaccine at local pharmacy or Health Dept. Verbalized acceptance and understanding.  Tdap: up to date   Flu Vaccine: up to date  Pneumococcal Vaccine: up to date  Covid-19 Vaccine: Information provided.   Screening Tests Health Maintenance  Topic Date Due  . MAMMOGRAM  04/10/2018  . COLONOSCOPY  02/27/2023  . TETANUS/TDAP  08/12/2025  . INFLUENZA VACCINE  Completed  . DEXA SCAN  Completed  . Hepatitis C Screening  Completed  . PNA vac Low Risk Adult  Completed    Cancer Screenings:  Colorectal Screening: Completed 2015. Repeat every 10 years  Mammogram: ordered. Please call [phone] to schedule your mammogram.   Bone Density: Completed 2017.   Lung Cancer Screening: (Low Dose CT Chest recommended if Age 59-80 years, 30 pack-year currently smoking OR have quit w/in 15years.) does not qualify.     Additional Screening:  Hepatitis C Screening: does qualify; Completed 2018  Vision Screening: Recommended annual ophthalmology exams for early detection of glaucoma and other disorders of the eye. Is the patient up to date with their annual eye exam?  Yes  Who is the provider or what is the name of the office in which the pt attends annual eye exams? [facility]  Dental Screening: Recommended annual dental exams for proper oral hygiene  Community Resource Referral:  CRR required this visit?  No       Plan:  I have personally reviewed and addressed the Medicare Annual Wellness questionnaire and have noted the following in the patient's chart:  A. Medical and social history B. Use of alcohol, tobacco or illicit drugs  C. Current  medications and supplements D. Functional ability and  status E.  Nutritional status F.  Physical activity G. Advance directives H. List of other physicians I.  Hospitalizations, surgeries, and ER visits in previous 12 months J.  [place] such as hearing and vision if needed, cognitive and depression L. Referrals and appointments   In addition, I have reviewed and discussed with patient certain preventive protocols, quality metrics, and best practice recommendations. A written personalized care plan for preventive services as well as general preventive health recommendations were provided to patient.  Signed,    Bevelyn Ngo, LPN  09/01/5945 Nurse Health Advisor   Nurse Notes: none

## 2019-04-04 NOTE — Telephone Encounter (Signed)
Requested medication (s) are due for refill today: yes  Requested medication (s) are on the active medication list: yes  Last refill:  01/31/2019  Future visit scheduled: yes  Notes to clinic: no assigned protocol    Requested Prescriptions  Pending Prescriptions Disp Refills   clotrimazole-betamethasone (LOTRISONE) cream [Pharmacy Med Name: CLOTRIMAZOLE-BETAMETHASONE CRM] 30 g 0    Sig: Apply 1 application topically 2 (two) times daily.      Off-Protocol Failed - 04/04/2019 10:44 AM      Failed - Medication not assigned to a protocol, review manually.      Passed - Valid encounter within last 12 months    Recent Outpatient Visits           3 months ago Cellulitis of toe of right foot   [facility] Gabriel Cirri, NP   4 months ago Flu vaccine need   [facility] [place], [place], DO   1 year ago Medicare annual wellness visit, subsequent   [facility] [place], [place], DO   1 year ago Upper respiratory tract infection, unspecified type   [facility], [place], [place]   1 year ago Major depressive disorder with single episode, in partial remission [facility])   [facility] [place], Oralia Rud, DO       Future Appointments             In 1 month Johnson, Oralia Rud, DO [facility], [facility]   In 1 year  [facility], [facility]

## 2019-04-04 NOTE — Telephone Encounter (Signed)
LOV 12/26/18

## 2019-04-23 ENCOUNTER — Other Ambulatory Visit: Payer: Self-pay | Admitting: Family Medicine

## 2019-05-04 ENCOUNTER — Other Ambulatory Visit: Payer: Self-pay | Admitting: Family Medicine

## 2019-05-10 ENCOUNTER — Ambulatory Visit (INDEPENDENT_AMBULATORY_CARE_PROVIDER_SITE_OTHER): Payer: Medicare PPO | Admitting: Family Medicine

## 2019-05-10 ENCOUNTER — Other Ambulatory Visit: Payer: Self-pay

## 2019-05-10 ENCOUNTER — Other Ambulatory Visit: Payer: Self-pay | Admitting: Family Medicine

## 2019-05-10 ENCOUNTER — Encounter: Payer: Self-pay | Admitting: Family Medicine

## 2019-05-10 VITALS — BP 130/87 | HR 85 | Temp 98.0°F | Ht 63.0 in | Wt 192.6 lb

## 2019-05-10 DIAGNOSIS — N3281 Overactive bladder: Secondary | ICD-10-CM

## 2019-05-10 DIAGNOSIS — I10 Essential (primary) hypertension: Secondary | ICD-10-CM | POA: Diagnosis not present

## 2019-05-10 DIAGNOSIS — F324 Major depressive disorder, single episode, in partial remission: Secondary | ICD-10-CM | POA: Diagnosis not present

## 2019-05-10 DIAGNOSIS — E559 Vitamin D deficiency, unspecified: Secondary | ICD-10-CM | POA: Diagnosis not present

## 2019-05-10 DIAGNOSIS — R7301 Impaired fasting glucose: Secondary | ICD-10-CM | POA: Diagnosis not present

## 2019-05-10 DIAGNOSIS — E782 Mixed hyperlipidemia: Secondary | ICD-10-CM

## 2019-05-10 DIAGNOSIS — F0280 Dementia in other diseases classified elsewhere without behavioral disturbance: Secondary | ICD-10-CM | POA: Diagnosis not present

## 2019-05-10 DIAGNOSIS — G309 Alzheimer's disease, unspecified: Secondary | ICD-10-CM | POA: Diagnosis not present

## 2019-05-10 LAB — UA/M W/RFLX CULTURE, ROUTINE
Bilirubin, UA: NEGATIVE
Glucose, UA: NEGATIVE
Leukocytes,UA: NEGATIVE
Nitrite, UA: NEGATIVE
Protein,UA: NEGATIVE
RBC, UA: NEGATIVE
Specific Gravity, UA: 1.025 (ref 1.005–1.030)
Urobilinogen, Ur: 0.2 mg/dL (ref 0.2–1.0)
pH, UA: 5.5 (ref 5.0–7.5)

## 2019-05-10 LAB — MICROALBUMIN, URINE WAIVED
Creatinine, Urine Waived: 200 mg/dL (ref 10–300)
Microalb, Ur Waived: 10 mg/L (ref 0–19)
Microalb/Creat Ratio: <30 mg/g (ref <30)

## 2019-05-10 LAB — BAYER DCA HB A1C WAIVED: HB A1C (BAYER DCA - WAIVED): 4.9 % (ref <7.0)

## 2019-05-10 MED ORDER — CLOTRIMAZOLE-BETAMETHASONE 1-0.05 % EX CREA: 1.0000 "application " | TOPICAL_CREAM | Freq: Two times a day (BID) | CUTANEOUS | 3 refills | Status: DC

## 2019-05-10 MED ORDER — BUPROPION HCL ER (XL) 300 MG PO TB24: ORAL_TABLET | ORAL | 1 refills | Status: DC

## 2019-05-10 MED ORDER — HYDROCHLOROTHIAZIDE 25 MG PO TABS: 25.0000 mg | ORAL_TABLET | Freq: Every day | ORAL | 1 refills | Status: DC

## 2019-05-10 MED ORDER — HYDROXYZINE HCL 25 MG PO TABS: 25.0000 mg | ORAL_TABLET | Freq: Three times a day (TID) | ORAL | 1 refills | Status: DC | PRN

## 2019-05-10 MED ORDER — VENLAFAXINE HCL ER 150 MG PO CP24: 150.0000 mg | ORAL_CAPSULE | Freq: Every day | ORAL | 1 refills | Status: DC

## 2019-05-10 MED ORDER — NAPROXEN 500 MG PO TABS: 500.0000 mg | ORAL_TABLET | Freq: Two times a day (BID) | ORAL | 1 refills | Status: DC

## 2019-05-10 MED ORDER — ARIPIPRAZOLE 5 MG PO TABS: 5.0000 mg | ORAL_TABLET | Freq: Every day | ORAL | 1 refills | Status: DC

## 2019-05-10 NOTE — Assessment & Plan Note (Signed)
Rechecking UA today. Await results. Call with any concerns.

## 2019-05-10 NOTE — Assessment & Plan Note (Signed)
Rechecking labs today. Await results. Call with any concerns.  

## 2019-05-10 NOTE — Assessment & Plan Note (Signed)
Rechecking labs today. Await results. Treat as needed.  °

## 2019-05-10 NOTE — Assessment & Plan Note (Signed)
Under good control on current regimen. Continue current regimen. Continue to monitor. Call with any concerns. Refills given. Labs drawn today.   

## 2019-05-10 NOTE — Progress Notes (Signed)
BP 130/87 (BP Location: Left Arm, Patient Position: Sitting, Cuff Size: Normal)   Pulse 85   Temp 98 F (36.7 C) (Oral)   Ht 5\' 3"  (1.6 m)   Wt 192 lb 9.6 oz (87.4 kg)   SpO2 95%   BMI 34.12 kg/m    Subjective:    Patient ID: [name] [name], female    DOB: [birth year], 69 y.o.   MRN: [id number]  HPI: [name] [name] is a 69 y.o. female  Chief Complaint  Patient presents with  . Hypertension  . Hyperlipidemia  . Depression  . ifg   Impaired Fasting Glucose HbA1C:  Lab Results  Component Value Date   HGBA1C 4.9 05/10/2019   Duration of elevated blood sugar: chronic Polydipsia: no Polyuria: no Weight change: no Visual disturbance: no Glucose Monitoring: no    Accucheck frequency: Not Checking Diabetic Education: Completed Family history of diabetes: yes  HYPERTENSION / HYPERLIPIDEMIA Satisfied with current treatment? yes Duration of hypertension: chronic BP monitoring frequency: not checking BP medication side effects: no Past BP meds: HCTZ Duration of hyperlipidemia: chronic Cholesterol medication side effects: no Cholesterol supplements: none Past cholesterol medications: none Medication compliance: excellent compliance Aspirin: no Recent stressors: no Recurrent headaches: no Visual changes: no Palpitations: no Dyspnea: no Chest pain: no Lower extremity edema: no Dizzy/lightheaded: no  DEPRESSION Mood status: stable Satisfied with current treatment?: yes Symptom severity: mild  Duration of current treatment : chronic Side effects: no Medication compliance: excellent compliance Psychotherapy/counseling: no  Previous psychiatric medications: effexor, wellbutrin, abilify, hydroxyzine Depressed mood: no Anxious mood: no Anhedonia: no Significant weight loss or gain: no Insomnia: no  Fatigue: no Feelings of worthlessness or guilt: no Impaired concentration/indecisiveness: no Suicidal ideations: no Hopelessness: no Crying spells: no Depression  screen [facility] 2/9 05/10/2019 04/04/2019 11/09/2018 02/24/2018 11/24/2017  Decreased Interest 0 3 2 1 1   Down, Depressed, Hopeless 0 2 1 2 1   PHQ - 2 Score 0 5 3 3 2   Altered sleeping 2 3 0 2 0  Tired, decreased energy 0 3 1 2 1   Change in appetite 0 2 0 0 0  Feeling bad or failure about yourself  0 2 0 1 0  Trouble concentrating 0 0 1 1 0  Moving slowly or fidgety/restless 0 0 0 2 1  Suicidal thoughts 0 0 0 1 0  PHQ-9 Score 2 15 5 12 4   Difficult doing work/chores Not difficult at all Very difficult Somewhat difficult - Somewhat difficult  Some recent data might be hidden   Memory loss is not doing well. Husband has been taking care of her. Following with neurology as needed. No other concerns or complaints at this time.   Relevant past medical, surgical, family and social history reviewed and updated as indicated. Interim medical history since our last visit reviewed. Allergies and medications reviewed and updated.  Review of Systems  Constitutional: Negative.   Respiratory: Negative.   Cardiovascular: Negative.   Gastrointestinal: Negative.   Musculoskeletal: Negative.   Skin: Negative.   Neurological: Negative.   Psychiatric/Behavioral: Positive for confusion and decreased concentration. Negative for agitation, behavioral problems, dysphoric mood, hallucinations, self-injury, sleep disturbance and suicidal ideas. The patient is not nervous/anxious and is not hyperactive.     Per HPI unless specifically indicated above     Objective:    BP 130/87 (BP Location: Left Arm, Patient Position: Sitting, Cuff Size: Normal)   Pulse 85   Temp 98 F (36.7 C) (Oral)   Ht 5\' 3"  (  1.6 m)   Wt 192 lb 9.6 oz (87.4 kg)   SpO2 95%   BMI 34.12 kg/m   Wt Readings from Last 3 Encounters:  05/10/19 192 lb 9.6 oz (87.4 kg)  11/09/18 199 lb (90.3 kg)  02/24/18 199 lb (90.3 kg)    Physical Exam Vitals and nursing note reviewed.  Constitutional:      General: She is not in acute distress.     Appearance: Normal appearance. She is not ill-appearing, toxic-appearing or diaphoretic.  HENT:     Head: Normocephalic and atraumatic.     Right Ear: External ear normal.     Left Ear: External ear normal.     Nose: Nose normal.     Mouth/Throat:     Mouth: Mucous membranes are moist.     Pharynx: Oropharynx is clear.  Eyes:     General: No scleral icterus.       Right eye: No discharge.        Left eye: No discharge.     Extraocular Movements: Extraocular movements intact.     Conjunctiva/sclera: Conjunctivae normal.     Pupils: Pupils are equal, round, and reactive to light.  Cardiovascular:     Rate and Rhythm: Normal rate and regular rhythm.     Pulses: Normal pulses.     Heart sounds: Normal heart sounds. No murmur. No friction rub. No gallop.   Pulmonary:     Effort: Pulmonary effort is normal. No respiratory distress.     Breath sounds: Normal breath sounds. No stridor. No wheezing, rhonchi or rales.  Chest:     Chest wall: No tenderness.  Musculoskeletal:        General: Normal range of motion.     Cervical back: Normal range of motion and neck supple.  Skin:    General: Skin is warm and dry.     Capillary Refill: Capillary refill takes less than 2 seconds.     Coloration: Skin is not jaundiced or pale.     Findings: No bruising, erythema, lesion or rash.  Neurological:     General: No focal deficit present.     Mental Status: She is alert and oriented to person, place, and time. Mental status is at baseline.  Psychiatric:        Mood and Affect: Mood normal.        Behavior: Behavior normal.        Thought Content: Thought content normal.        Judgment: Judgment normal.     Results for orders placed or performed in visit on 05/10/19  Bayer DCA Hb A1c Waived  Result Value Ref Range   HB A1C (BAYER DCA - WAIVED) 4.9 <7.0 %  Microalbumin, Urine Waived  Result Value Ref Range   Microalb, Ur Waived 10 0 - 19 mg/L   Creatinine, Urine Waived 200 10 - 300 mg/dL     Microalb/Creat Ratio <30 <30 mg/g  UA/M w/rflx Culture, Routine   Specimen: Blood   BLD  Result Value Ref Range   Specific Gravity, UA 1.025 1.005 - 1.030   pH, UA 5.5 5.0 - 7.5   Color, UA Yellow Yellow   Appearance Ur Clear Clear   Leukocytes,UA Negative Negative   Protein,UA Negative Negative/Trace   Glucose, UA Negative Negative   Ketones, UA Trace (A) Negative   RBC, UA Negative Negative   Bilirubin, UA Negative Negative   Urobilinogen, Ur 0.2 0.2 - 1.0 mg/dL   Nitrite, UA  Negative Negative      Assessment & Plan:   Problem List Items Addressed This Visit      Cardiovascular and Mediastinum   Essential (primary) hypertension - Primary    Under good control on current regimen. Continue current regimen. Continue to monitor. Call with any concerns. Refills given. Labs drawn today.       Relevant Medications   hydrochlorothiazide (HYDRODIURIL) 25 MG tablet   Other Relevant Orders   CBC with Differential/Platelet   Comprehensive metabolic panel   Microalbumin, Urine Waived (Completed)   TSH     Endocrine   Impaired fasting glucose    Doing great with A1c of 4.9. Eating much more healthy. If still down next visit, will resolve off her list.       Relevant Orders   Bayer DCA Hb A1c Waived (Completed)   CBC with Differential/Platelet   Comprehensive metabolic panel   Microalbumin, Urine Waived (Completed)     Nervous and Auditory   Dementia (HCC)    Stable. Following with neurology. Continue medication. Call with any concerns.       Relevant Medications   ARIPiprazole (ABILIFY) 5 MG tablet   buPROPion (WELLBUTRIN XL) 300 MG 24 hr tablet   hydrOXYzine (ATARAX/VISTARIL) 25 MG tablet   venlafaxine XR (EFFEXOR-XR) 150 MG 24 hr capsule   Other Relevant Orders   CBC with Differential/Platelet   Comprehensive metabolic panel     Genitourinary   Overactive bladder    Rechecking UA today. Await results. Call with any concerns.       Relevant Orders   CBC  with Differential/Platelet   Comprehensive metabolic panel   UA/M w/rflx Culture, Routine (Completed)     Other   Depression    Under good control on current regimen. Continue current regimen. Continue to monitor. Call with any concerns. Refills given. Labs drawn today.       Relevant Medications   buPROPion (WELLBUTRIN XL) 300 MG 24 hr tablet   hydrOXYzine (ATARAX/VISTARIL) 25 MG tablet   venlafaxine XR (EFFEXOR-XR) 150 MG 24 hr capsule   Other Relevant Orders   CBC with Differential/Platelet   Comprehensive metabolic panel   TSH   Hyperlipidemia    Rechecking labs today. Await results. Treat as needed.       Relevant Medications   hydrochlorothiazide (HYDRODIURIL) 25 MG tablet   Other Relevant Orders   CBC with Differential/Platelet   Comprehensive metabolic panel   Lipid Panel w/o Chol/HDL Ratio   Vitamin D deficiency    Rechecking labs today. Await results. Call with any concerns.       Relevant Orders   CBC with Differential/Platelet   Comprehensive metabolic panel   VITAMIN D 25 Hydroxy (Vit-D Deficiency, Fractures)       Follow up plan: Return in about 6 months (around 11/09/2019) for physical/wellness.

## 2019-05-10 NOTE — Assessment & Plan Note (Signed)
Stable. Following with neurology. Continue medication. Call with any concerns.

## 2019-05-10 NOTE — Assessment & Plan Note (Signed)
Doing great with A1c of 4.9. Eating much more healthy. If still down next visit, will resolve off her list.

## 2019-05-11 ENCOUNTER — Encounter: Payer: Self-pay | Admitting: Family Medicine

## 2019-05-11 LAB — COMPREHENSIVE METABOLIC PANEL
ALT: 19 IU/L (ref 0–32)
AST: 21 IU/L (ref 0–40)
Albumin/Globulin Ratio: 2.2 (ref 1.2–2.2)
Albumin: 4.4 g/dL (ref 3.8–4.8)
Alkaline Phosphatase: 79 IU/L (ref 39–117)
BUN/Creatinine Ratio: 23 (ref 12–28)
BUN: 27 mg/dL (ref 8–27)
Bilirubin Total: 0.4 mg/dL (ref 0.0–1.2)
CO2: 24 mmol/L (ref 20–29)
Calcium: 9.4 mg/dL (ref 8.7–10.3)
Chloride: 103 mmol/L (ref 96–106)
Creatinine, Ser: 1.18 mg/dL — ABNORMAL HIGH (ref 0.57–1.00)
GFR calc Af Amer: 54 mL/min/{1.73_m2} — ABNORMAL LOW (ref >59)
GFR calc non Af Amer: 47 mL/min/{1.73_m2} — ABNORMAL LOW (ref >59)
Globulin, Total: 2 g/dL (ref 1.5–4.5)
Glucose: 76 mg/dL (ref 65–99)
Potassium: 4.1 mmol/L (ref 3.5–5.2)
Sodium: 141 mmol/L (ref 134–144)
Total Protein: 6.4 g/dL (ref 6.0–8.5)

## 2019-05-11 LAB — CBC WITH DIFFERENTIAL/PLATELET
Basophils Absolute: 0 10*3/uL (ref 0.0–0.2)
Basos: 0 %
EOS (ABSOLUTE): 0.2 10*3/uL (ref 0.0–0.4)
Eos: 2 %
Hematocrit: 43.8 % (ref 34.0–46.6)
Hemoglobin: 14.3 g/dL (ref 11.1–15.9)
Immature Grans (Abs): 0 10*3/uL (ref 0.0–0.1)
Immature Granulocytes: 0 %
Lymphocytes Absolute: 1.4 10*3/uL (ref 0.7–3.1)
Lymphs: 20 %
MCH: 28 pg (ref 26.6–33.0)
MCHC: 32.6 g/dL (ref 31.5–35.7)
MCV: 86 fL (ref 79–97)
Monocytes Absolute: 0.5 10*3/uL (ref 0.1–0.9)
Monocytes: 8 %
Neutrophils Absolute: 4.7 10*3/uL (ref 1.4–7.0)
Neutrophils: 70 %
Platelets: 265 10*3/uL (ref 150–450)
RBC: 5.1 x10E6/uL (ref 3.77–5.28)
RDW: 12.8 % (ref 11.7–15.4)
WBC: 6.7 10*3/uL (ref 3.4–10.8)

## 2019-05-11 LAB — VITAMIN D 25 HYDROXY (VIT D DEFICIENCY, FRACTURES): Vit D, 25-Hydroxy: 42.2 ng/mL (ref 30.0–100.0)

## 2019-05-11 LAB — LIPID PANEL W/O CHOL/HDL RATIO
Cholesterol, Total: 215 mg/dL — ABNORMAL HIGH (ref 100–199)
HDL: 49 mg/dL (ref >39)
LDL Chol Calc (NIH): 148 mg/dL — ABNORMAL HIGH (ref 0–99)
Triglycerides: 102 mg/dL (ref 0–149)
VLDL Cholesterol Cal: 18 mg/dL (ref 5–40)

## 2019-05-11 LAB — TSH: TSH: 0.914 u[IU]/mL (ref 0.450–4.500)

## 2019-07-25 ENCOUNTER — Other Ambulatory Visit: Payer: Self-pay | Admitting: Family Medicine

## 2019-07-25 NOTE — Telephone Encounter (Signed)
Requested medication (s) are due for refill today:yes  Requested medication (s) are on the active medication list:yes  Last refill: 11/09/2018   #12 0 refills  Future visit scheduled yes 11/09/19  Notes to clinic: not delegated  Requested Prescriptions  Pending Prescriptions Disp Refills   Vitamin D, Ergocalciferol, (DRISDOL) 1.25 MG (50000 UNIT) CAPS capsule [Pharmacy Med Name: VITAMIN D2 1.25MG (50,000 UNIT)] 12 capsule 0    Sig: Take 1 capsule (50,000 Units total) by mouth every 7 (seven) days.      Endocrinology:  Vitamins - Vitamin D Supplementation Failed - 07/25/2019  1:30 PM      Failed - 50,000 IU strengths are not delegated      Failed - Phosphate in normal range and within 360 days    No results found for: PHOS        Passed - Ca in normal range and within 360 days    Calcium  Date Value Ref Range Status  05/10/2019 9.4 8.7 - 10.3 mg/dL Final          Passed - Vitamin D in normal range and within 360 days    Vit D, 25-Hydroxy  Date Value Ref Range Status  05/10/2019 42.2 30.0 - 100.0 ng/mL Final    Comment:    Vitamin D deficiency has been defined by the Institute of Medicine and an Endocrine Society practice guideline as a level of serum 25-OH vitamin D less than 20 ng/mL (1,2). The Endocrine Society went on to further define vitamin D insufficiency as a level between 21 and 29 ng/mL (2). 1. IOM (Institute of Medicine). 2010. Dietary reference    intakes for calcium and D. [place]: The    [employer]. 2. Holick MF, Binkley Terrell, Bischoff-Ferrari HA, et al.    Evaluation, treatment, and prevention of vitamin D    deficiency: an Endocrine Society clinical practice    guideline. JCEM. 2011 Jul; 96(7):1911-30.           Passed - Valid encounter within last 12 months    Recent Outpatient Visits           2 months ago Essential (primary) hypertension   [facility] [place], [name], DO   7 months ago Cellulitis of toe of right  foot   [facility] Gabriel Cirri, NP   8 months ago Flu vaccine need   [facility] [employer], [place], DO   1 year ago Medicare annual wellness visit, subsequent   [facility] [place], [place], DO   1 year ago Upper respiratory tract infection, unspecified type   [facility], [place], [place]       Future Appointments             In 3 months Laural Benes, Oralia Rud, DO [facility], [facility]   In 9 months  [facility], [facility]

## 2019-07-27 ENCOUNTER — Other Ambulatory Visit: Payer: Self-pay | Admitting: Family Medicine

## 2019-07-27 NOTE — Telephone Encounter (Signed)
LOV 05/10/19

## 2019-07-27 NOTE — Telephone Encounter (Signed)
Requested medication (s) are due for refill today - no- refused 2 days ago  Requested medication (s) are on the active medication list - yes  Future visit scheduled -yes  Last refill: 01/17/19  Notes to clinic: Request non delegated Rx  Requested Prescriptions  Pending Prescriptions Disp Refills   Vitamin D, Ergocalciferol, (DRISDOL) 1.25 MG (50000 UNIT) CAPS capsule [Pharmacy Med Name: VITAMIN D2 1.25MG (50,000 UNIT)] 12 capsule 0    Sig: Take 1 capsule (50,000 Units total) by mouth every 7 (seven) days.      Endocrinology:  Vitamins - Vitamin D Supplementation Failed - 07/27/2019  2:36 PM      Failed - 50,000 IU strengths are not delegated      Failed - Phosphate in normal range and within 360 days    No results found for: PHOS        Passed - Ca in normal range and within 360 days    Calcium  Date Value Ref Range Status  05/10/2019 9.4 8.7 - 10.3 mg/dL Final          Passed - Vitamin D in normal range and within 360 days    Vit D, 25-Hydroxy  Date Value Ref Range Status  05/10/2019 42.2 30.0 - 100.0 ng/mL Final    Comment:    Vitamin D deficiency has been defined by the Institute of Medicine and an Endocrine Society practice guideline as a level of serum 25-OH vitamin D less than 20 ng/mL (1,2). The Endocrine Society went on to further define vitamin D insufficiency as a level between 21 and 29 ng/mL (2). 1. IOM (Institute of Medicine). 2010. Dietary reference    intakes for calcium and D. [place]: The    [employer]. 2. Holick MF, Binkley [place], Bischoff-Ferrari HA, et al.    Evaluation, treatment, and prevention of vitamin D    deficiency: an Endocrine Society clinical practice    guideline. JCEM. 2011 Jul; 96(7):1911-30.           Passed - Valid encounter within last 12 months    Recent Outpatient Visits           2 months ago Essential (primary) hypertension   [employer], [name], DO   7 months ago Cellulitis of toe  of right foot   [facility] Kathrine Haddock, NP   8 months ago Flu vaccine need   [employer], [place], DO   1 year ago Medicare annual wellness visit, subsequent   [employer], [place], DO   1 year ago Upper respiratory tract infection, unspecified type   [place], Lilia Argue, [name]       Future Appointments             In 3 months Johnson, [name], DO [employer]   In 9 months  [employer]                Requested Prescriptions  Pending Prescriptions Disp Refills   Vitamin D, Ergocalciferol, (DRISDOL) 1.25 MG (50000 UNIT) CAPS capsule [Pharmacy Med Name: VITAMIN D2 1.25MG (50,000 UNIT)] 12 capsule 0    Sig: Take 1 capsule (50,000 Units total) by mouth every 7 (seven) days.      Endocrinology:  Vitamins - Vitamin D Supplementation Failed - 07/27/2019  2:36 PM      Failed - 50,000 IU strengths are not delegated      Failed -  Phosphate in normal range and within 360 days    No results found for: PHOS        Passed - Ca in normal range and within 360 days    Calcium  Date Value Ref Range Status  05/10/2019 9.4 8.7 - 10.3 mg/dL Final          Passed - Vitamin D in normal range and within 360 days    Vit D, 25-Hydroxy  Date Value Ref Range Status  05/10/2019 42.2 30.0 - 100.0 ng/mL Final    Comment:    Vitamin D deficiency has been defined by the Institute of Medicine and an Endocrine Society practice guideline as a level of serum 25-OH vitamin D less than 20 ng/mL (1,2). The Endocrine Society went on to further define vitamin D insufficiency as a level between 21 and 29 ng/mL (2). 1. IOM (Institute of Medicine). 2010. Dietary reference    intakes for calcium and D. [place]: The    [employer]. 2. Holick MF, Binkley , Bischoff-Ferrari HA, et al.    Evaluation, treatment, and prevention of vitamin D    deficiency: an Endocrine  Society clinical practice    guideline. JCEM. 2011 Jul; 96(7):1911-30.           Passed - Valid encounter within last 12 months    Recent Outpatient Visits           2 months ago Essential (primary) hypertension   [facility] [place], [name], DO   7 months ago Cellulitis of toe of right foot   [facility] Gabriel Cirri, NP   8 months ago Flu vaccine need   [employer], [place], DO   1 year ago Medicare annual wellness visit, subsequent   [facility] [place], [place], DO   1 year ago Upper respiratory tract infection, unspecified type   [facility], Salley Hews, [place]       Future Appointments             In 3 months Laural Benes, Oralia Rud, DO [employer], [facility]   In 9 months  [employer]

## 2019-10-10 ENCOUNTER — Encounter: Payer: Self-pay | Admitting: Family Medicine

## 2019-10-22 ENCOUNTER — Other Ambulatory Visit: Payer: Self-pay | Admitting: Family Medicine

## 2019-10-22 NOTE — Telephone Encounter (Signed)
Routing to provider  

## 2019-10-22 NOTE — Telephone Encounter (Signed)
Requested medication (s) are due for refill today: Yes  Requested medication (s) are on the active medication list: Yes  Last refill:  08/01/20  Future visit scheduled: Yes  Notes to clinic:  Unable to refill, cannot delegate     Requested Prescriptions  Pending Prescriptions Disp Refills   Vitamin D, Ergocalciferol, (DRISDOL) 1.25 MG (50000 UNIT) CAPS capsule [Pharmacy Med Name: VITAMIN D2 1.25MG (50,000 UNIT)] 12 capsule 0    Sig: Take 1 capsule (50,000 Units total) by mouth every 7 (seven) days.      Endocrinology:  Vitamins - Vitamin D Supplementation Failed - 10/22/2019  2:04 PM      Failed - 50,000 IU strengths are not delegated      Failed - Phosphate in normal range and within 360 days    No results found for: PHOS        Passed - Ca in normal range and within 360 days    Calcium  Date Value Ref Range Status  05/10/2019 9.4 8.7 - 10.3 mg/dL Final          Passed - Vitamin D in normal range and within 360 days    Vit D, 25-Hydroxy  Date Value Ref Range Status  05/10/2019 42.2 30.0 - 100.0 ng/mL Final    Comment:    Vitamin D deficiency has been defined by the Institute of Medicine and an Endocrine Society practice guideline as a level of serum 25-OH vitamin D less than 20 ng/mL (1,2). The Endocrine Society went on to further define vitamin D insufficiency as a level between 21 and 29 ng/mL (2). 1. IOM (Institute of Medicine). 2010. Dietary reference    intakes for calcium and D. [place]: The    [employer]. 2. Holick MF, Binkley [place], Bischoff-Ferrari HA, et al.    Evaluation, treatment, and prevention of vitamin D    deficiency: an Endocrine Society clinical practice    guideline. JCEM. 2011 Jul; 96(7):1911-30.           Passed - Valid encounter within last 12 months    Recent Outpatient Visits           5 months ago Essential (primary) hypertension   [facility] Johnson, [name], DO   10 months ago Cellulitis of toe of  right foot   [facility] Gabriel Cirri, NP   11 months ago Flu vaccine need   [employer], [place], DO   1 year ago Medicare annual wellness visit, subsequent   [facility] [place], [place], DO   1 year ago Upper respiratory tract infection, unspecified type   [facility] [place], Salley Hews, PA-C       Future Appointments             In 1 month Laural Benes, Oralia Rud, DO [employer] [facility]   In 6 months  [employer]

## 2019-11-09 ENCOUNTER — Ambulatory Visit: Payer: Medicare PPO | Admitting: Family Medicine

## 2019-11-23 ENCOUNTER — Encounter: Payer: Self-pay | Admitting: Family Medicine

## 2019-11-23 ENCOUNTER — Other Ambulatory Visit: Payer: Self-pay

## 2019-11-23 ENCOUNTER — Ambulatory Visit (INDEPENDENT_AMBULATORY_CARE_PROVIDER_SITE_OTHER): Payer: Medicare PPO | Admitting: Family Medicine

## 2019-11-23 VITALS — BP 132/75 | HR 84 | Temp 98.4°F | Ht 63.0 in | Wt 182.0 lb

## 2019-11-23 DIAGNOSIS — I10 Essential (primary) hypertension: Secondary | ICD-10-CM | POA: Diagnosis not present

## 2019-11-23 DIAGNOSIS — G309 Alzheimer's disease, unspecified: Secondary | ICD-10-CM | POA: Diagnosis not present

## 2019-11-23 DIAGNOSIS — Z1231 Encounter for screening mammogram for malignant neoplasm of breast: Secondary | ICD-10-CM | POA: Diagnosis not present

## 2019-11-23 DIAGNOSIS — F0280 Dementia in other diseases classified elsewhere without behavioral disturbance: Secondary | ICD-10-CM

## 2019-11-23 DIAGNOSIS — R7301 Impaired fasting glucose: Secondary | ICD-10-CM

## 2019-11-23 DIAGNOSIS — F324 Major depressive disorder, single episode, in partial remission: Secondary | ICD-10-CM

## 2019-11-23 DIAGNOSIS — E782 Mixed hyperlipidemia: Secondary | ICD-10-CM | POA: Diagnosis not present

## 2019-11-23 DIAGNOSIS — Z13820 Encounter for screening for osteoporosis: Secondary | ICD-10-CM | POA: Diagnosis not present

## 2019-11-23 DIAGNOSIS — E559 Vitamin D deficiency, unspecified: Secondary | ICD-10-CM | POA: Diagnosis not present

## 2019-11-23 DIAGNOSIS — Z23 Encounter for immunization: Secondary | ICD-10-CM | POA: Diagnosis not present

## 2019-11-23 LAB — BAYER DCA HB A1C WAIVED: HB A1C (BAYER DCA - WAIVED): 5.1 % (ref <7.0)

## 2019-11-23 MED ORDER — CLOTRIMAZOLE-BETAMETHASONE 1-0.05 % EX CREA: 1.0000 "application " | TOPICAL_CREAM | Freq: Two times a day (BID) | CUTANEOUS | 3 refills | Status: DC

## 2019-11-23 MED ORDER — NAPROXEN 500 MG PO TABS: 500.0000 mg | ORAL_TABLET | Freq: Two times a day (BID) | ORAL | 1 refills | Status: DC

## 2019-11-23 MED ORDER — VENLAFAXINE HCL ER 150 MG PO CP24: 150.0000 mg | ORAL_CAPSULE | Freq: Every day | ORAL | 1 refills | Status: DC

## 2019-11-23 MED ORDER — HYDROCHLOROTHIAZIDE 25 MG PO TABS: 25.0000 mg | ORAL_TABLET | Freq: Every day | ORAL | 1 refills | Status: DC

## 2019-11-23 MED ORDER — BUPROPION HCL ER (XL) 300 MG PO TB24: ORAL_TABLET | ORAL | 1 refills | Status: DC

## 2019-11-23 MED ORDER — HYDROXYZINE HCL 25 MG PO TABS: 25.0000 mg | ORAL_TABLET | Freq: Three times a day (TID) | ORAL | 1 refills | Status: DC | PRN

## 2019-11-23 MED ORDER — ARIPIPRAZOLE 5 MG PO TABS: 5.0000 mg | ORAL_TABLET | Freq: Every day | ORAL | 1 refills | Status: DC

## 2019-11-23 NOTE — Progress Notes (Signed)
BP 132/75   Pulse 84   Temp 98.4 F (36.9 C) (Oral)   Ht 5\' 3"  (1.6 m)   Wt 182 lb (82.6 kg)   SpO2 96%   BMI 32.24 kg/m    Subjective:    Patient ID: [name] [name], female    DOB: Aug 11, 1950, 69 y.o.   MRN: [id number]  HPI: [name] [name] is a 69 y.o. female  Chief Complaint  Patient presents with  . Hypertension    Follow up.  . Hyperlipidemia    Follow up.  . Depression    Follow up.   . Flu Vaccine    High dose.   HYPERTENSION / HYPERLIPIDEMIA Satisfied with current treatment? yes Duration of hypertension: chronic BP monitoring frequency: not checking BP medication side effects: no Past BP meds: HCTZ Duration of hyperlipidemia: chronic Cholesterol medication side effects: not on anything Cholesterol supplements: none Past cholesterol medications: none Medication compliance: excellent compliance Aspirin: no Recent stressors: no Recurrent headaches: no Visual changes: no Palpitations: no Dyspnea: no Chest pain: no Lower extremity edema: no Dizzy/lightheaded: no  ANXIETY/DEPRESSION Duration: Chronic Status:stable Anxious mood: no  Excessive worrying: no Irritability: no  Sweating: no Nausea: no Palpitations:no Hyperventilation: no Panic attacks: no Agoraphobia: no  Obscessions/compulsions: no Depressed mood: no Depression screen [facility] 2/9 11/23/2019 05/10/2019 04/04/2019 11/09/2018 02/24/2018  Decreased Interest 1 0 3 2 1   Down, Depressed, Hopeless 1 0 2 1 2   PHQ - 2 Score 2 0 5 3 3   Altered sleeping 0 2 3 0 2  Tired, decreased energy 2 0 3 1 2   Change in appetite 0 0 2 0 0  Feeling bad or failure about yourself  0 0 2 0 1  Trouble concentrating 2 0 0 1 1  Moving slowly or fidgety/restless 0 0 0 0 2  Suicidal thoughts 0 0 0 0 1  PHQ-9 Score 6 2 15 5 12   Difficult doing work/chores Somewhat difficult Not difficult at all Very difficult Somewhat difficult -  Some recent data might be hidden   Anhedonia: no Weight changes: no Insomnia: no     Hypersomnia: no Fatigue/loss of energy: no Feelings of worthlessness: no Feelings of guilt: no Impaired concentration/indecisiveness: no Suicidal ideations: no  Crying spells: no Recent Stressors/Life Changes: no   Relationship problems: no   Family stress: no     Financial stress: no    Job stress: no    Recent death/loss: no  Impaired Fasting Glucose HbA1C:  Lab Results  Component Value Date   HGBA1C 4.9 05/10/2019   Duration of elevated blood sugar: chronic Polydipsia: no Polyuria: no Weight change: no Visual disturbance: no Glucose Monitoring: no Diabetic Education: Not Completed Family history of diabetes: yes   Relevant past medical, surgical, family and social history reviewed and updated as indicated. Interim medical history since our last visit reviewed. Allergies and medications reviewed and updated.  Review of Systems  Constitutional: Negative.   Respiratory: Negative.   Cardiovascular: Negative.   Gastrointestinal: Negative.   Musculoskeletal: Negative.   Neurological: Negative.   Psychiatric/Behavioral: Negative.     Per HPI unless specifically indicated above     Objective:    BP 132/75   Pulse 84   Temp 98.4 F (36.9 C) (Oral)   Ht 5\' 3"  (1.6 m)   Wt 182 lb (82.6 kg)   SpO2 96%   BMI 32.24 kg/m   Wt Readings from Last 3 Encounters:  11/23/19 182 lb (82.6 kg)  05/10/19 192  lb 9.6 oz (87.4 kg)  11/09/18 199 lb (90.3 kg)    Physical Exam Vitals and nursing note reviewed.  Constitutional:      General: She is not in acute distress.    Appearance: Normal appearance. She is not ill-appearing, toxic-appearing or diaphoretic.  HENT:     Head: Normocephalic and atraumatic.     Right Ear: External ear normal.     Left Ear: External ear normal.     Nose: Nose normal.     Mouth/Throat:     Mouth: Mucous membranes are moist.     Pharynx: Oropharynx is clear.  Eyes:     General: No scleral icterus.       Right eye: No discharge.         Left eye: No discharge.     Extraocular Movements: Extraocular movements intact.     Conjunctiva/sclera: Conjunctivae normal.     Pupils: Pupils are equal, round, and reactive to light.  Cardiovascular:     Rate and Rhythm: Normal rate and regular rhythm.     Pulses: Normal pulses.     Heart sounds: Normal heart sounds. No murmur heard.  No friction rub. No gallop.   Pulmonary:     Effort: Pulmonary effort is normal. No respiratory distress.     Breath sounds: Normal breath sounds. No stridor. No wheezing, rhonchi or rales.  Chest:     Chest wall: No tenderness.  Musculoskeletal:        General: Normal range of motion.     Cervical back: Normal range of motion and neck supple.  Skin:    General: Skin is warm and dry.     Capillary Refill: Capillary refill takes less than 2 seconds.     Coloration: Skin is not jaundiced or pale.     Findings: No bruising, erythema, lesion or rash.  Neurological:     General: No focal deficit present.     Mental Status: She is alert and oriented to person, place, and time. Mental status is at baseline.  Psychiatric:        Mood and Affect: Mood normal.        Behavior: Behavior normal.        Thought Content: Thought content normal.        Judgment: Judgment normal.     Results for orders placed or performed in visit on 05/10/19  Bayer DCA Hb A1c Waived  Result Value Ref Range   HB A1C (BAYER DCA - WAIVED) 4.9 <7.0 %  CBC with Differential/Platelet  Result Value Ref Range   WBC 6.7 3.4 - 10.8 x10E3/uL   RBC 5.10 3.77 - 5.28 x10E6/uL   Hemoglobin 14.3 11.1 - 15.9 g/dL   Hematocrit 93.7 90.2 - 46.6 %   MCV 86 79 - 97 fL   MCH 28.0 26.6 - 33.0 pg   MCHC 32.6 31 - 35 g/dL   RDW 40.9 73.5 - 32.9 %   Platelets 265 150 - 450 x10E3/uL   Neutrophils 70 Not Estab. %   Lymphs 20 Not Estab. %   Monocytes 8 Not Estab. %   Eos 2 Not Estab. %   Basos 0 Not Estab. %   Neutrophils Absolute 4.7 1.40 - 7.00 x10E3/uL   Lymphocytes Absolute 1.4 0 - 3  x10E3/uL   Monocytes Absolute 0.5 0 - 0 x10E3/uL   EOS (ABSOLUTE) 0.2 0.0 - 0.4 x10E3/uL   Basophils Absolute 0.0 0 - 0 x10E3/uL   Immature Granulocytes 0  Not Estab. %   Immature Grans (Abs) 0.0 0.0 - 0.1 x10E3/uL  Comprehensive metabolic panel  Result Value Ref Range   Glucose 76 65 - 99 mg/dL   BUN 27 8 - 27 mg/dL   Creatinine, Ser 9.32 (H) 0.57 - 1.00 mg/dL   GFR calc non Af Amer 47 (L) >59 mL/min/1.73   GFR calc Af Amer 54 (L) >59 mL/min/1.73   BUN/Creatinine Ratio 23 12 - 28   Sodium 141 134 - 144 mmol/L   Potassium 4.1 3.5 - 5.2 mmol/L   Chloride 103 96 - 106 mmol/L   CO2 24 20 - 29 mmol/L   Calcium 9.4 8.7 - 10.3 mg/dL   Total Protein 6.4 6.0 - 8.5 g/dL   Albumin 4.4 3.8 - 4.8 g/dL   Globulin, Total 2.0 1.5 - 4.5 g/dL   Albumin/Globulin Ratio 2.2 1.2 - 2.2   Bilirubin Total 0.4 0.0 - 1.2 mg/dL   Alkaline Phosphatase 79 39 - 117 IU/L   AST 21 0 - 40 IU/L   ALT 19 0 - 32 IU/L  Lipid Panel w/o Chol/HDL Ratio  Result Value Ref Range   Cholesterol, Total 215 (H) 100 - 199 mg/dL   Triglycerides 355 0 - 149 mg/dL   HDL 49 >73 mg/dL   VLDL Cholesterol Cal 18 5 - 40 mg/dL   LDL Chol Calc (NIH) 220 (H) 0 - 99 mg/dL  Microalbumin, Urine Waived  Result Value Ref Range   Microalb, Ur Waived 10 0 - 19 mg/L   Creatinine, Urine Waived 200 10 - 300 mg/dL   Microalb/Creat Ratio <30 <30 mg/g  TSH  Result Value Ref Range   TSH 0.914 0.450 - 4.500 uIU/mL  UA/M w/rflx Culture, Routine   Specimen: Blood   BLD  Result Value Ref Range   Specific Gravity, UA 1.025 1.005 - 1.030   pH, UA 5.5 5.0 - 7.5   Color, UA Yellow Yellow   Appearance Ur Clear Clear   Leukocytes,UA Negative Negative   Protein,UA Negative Negative/Trace   Glucose, UA Negative Negative   Ketones, UA Trace (A) Negative   RBC, UA Negative Negative   Bilirubin, UA Negative Negative   Urobilinogen, Ur 0.2 0.2 - 1.0 mg/dL   Nitrite, UA Negative Negative  VITAMIN D 25 Hydroxy (Vit-D Deficiency, Fractures)  Result  Value Ref Range   Vit D, 25-Hydroxy 42.2 30.0 - 100.0 ng/mL      Assessment & Plan:   Problem List Items Addressed This Visit      Cardiovascular and Mediastinum   Essential (primary) hypertension    Under good control on current regimen. Continue current regimen. Continue to monitor. Call with any concerns. Refills given. Labs drawn today.       Relevant Medications   hydrochlorothiazide (HYDRODIURIL) 25 MG tablet   Other Relevant Orders   CBC with Differential/Platelet   Comprehensive metabolic panel     Endocrine   Impaired fasting glucose - Primary    Rechecking labs today. Await results. Treat as needed.       Relevant Orders   CBC with Differential/Platelet   Comprehensive metabolic panel   Bayer DCA Hb U5K Waived     Nervous and Auditory   Dementia [facility])    Lives with her husband. Has help. Continue to monitor.       Relevant Medications   ARIPiprazole (ABILIFY) 5 MG tablet   buPROPion (WELLBUTRIN XL) 300 MG 24 hr tablet   hydrOXYzine (ATARAX/VISTARIL) 25 MG tablet  venlafaxine XR (EFFEXOR-XR) 150 MG 24 hr capsule     Other   Depression    Under good control on current regimen. Continue current regimen. Continue to monitor. Call with any concerns. Refills given. Labs drawn today.       Relevant Medications   buPROPion (WELLBUTRIN XL) 300 MG 24 hr tablet   hydrOXYzine (ATARAX/VISTARIL) 25 MG tablet   venlafaxine XR (EFFEXOR-XR) 150 MG 24 hr capsule   Other Relevant Orders   CBC with Differential/Platelet   Comprehensive metabolic panel   Hyperlipidemia    Rechecking labs today. Await results. Treat as needed.        Relevant Medications   hydrochlorothiazide (HYDRODIURIL) 25 MG tablet   Other Relevant Orders   CBC with Differential/Platelet   Comprehensive metabolic panel   Lipid Panel w/o Chol/HDL Ratio   Vitamin D deficiency    Rechecking labs today. Await results. Treat as needed.       Relevant Orders   CBC with Differential/Platelet    Comprehensive metabolic panel   VITAMIN D 25 Hydroxy (Vit-D Deficiency, Fractures)    Other Visit Diagnoses    Encounter for screening mammogram for malignant neoplasm of breast       Mammogram ordered today.   Relevant Orders   MM 3D SCREEN BREAST BILATERAL   Screening for osteoporosis       DEXA ordered today.   Relevant Orders   DG Bone Density   Need for immunization against influenza       Flu shot given today.   Relevant Orders   Flu Vaccine QUAD High Dose(Fluad) (Completed)       Follow up plan: Return in about 6 months (around 05/23/2020) for physical.

## 2019-11-23 NOTE — Assessment & Plan Note (Signed)
Under good control on current regimen. Continue current regimen. Continue to monitor. Call with any concerns. Refills given. Labs drawn today.   

## 2019-11-23 NOTE — Assessment & Plan Note (Signed)
Rechecking labs today. Await results. Treat as needed.  °

## 2019-11-23 NOTE — Assessment & Plan Note (Signed)
Lives with her husband. Has help. Continue to monitor.

## 2019-11-24 LAB — COMPREHENSIVE METABOLIC PANEL
ALT: 12 IU/L (ref 0–32)
AST: 12 IU/L (ref 0–40)
Albumin/Globulin Ratio: 2.2 (ref 1.2–2.2)
Albumin: 4.4 g/dL (ref 3.8–4.8)
Alkaline Phosphatase: 79 IU/L (ref 44–121)
BUN/Creatinine Ratio: 16 (ref 12–28)
BUN: 16 mg/dL (ref 8–27)
Bilirubin Total: 0.4 mg/dL (ref 0.0–1.2)
CO2: 25 mmol/L (ref 20–29)
Calcium: 9.5 mg/dL (ref 8.7–10.3)
Chloride: 101 mmol/L (ref 96–106)
Creatinine, Ser: 0.97 mg/dL (ref 0.57–1.00)
GFR calc Af Amer: 68 mL/min/{1.73_m2} (ref >59)
GFR calc non Af Amer: 59 mL/min/{1.73_m2} — ABNORMAL LOW (ref >59)
Globulin, Total: 2 g/dL (ref 1.5–4.5)
Glucose: 153 mg/dL — ABNORMAL HIGH (ref 65–99)
Potassium: 3.5 mmol/L (ref 3.5–5.2)
Sodium: 141 mmol/L (ref 134–144)
Total Protein: 6.4 g/dL (ref 6.0–8.5)

## 2019-11-24 LAB — CBC WITH DIFFERENTIAL/PLATELET
Basophils Absolute: 0.1 10*3/uL (ref 0.0–0.2)
Basos: 1 %
EOS (ABSOLUTE): 0.1 10*3/uL (ref 0.0–0.4)
Eos: 2 %
Hematocrit: 42.1 % (ref 34.0–46.6)
Hemoglobin: 13.9 g/dL (ref 11.1–15.9)
Immature Grans (Abs): 0 10*3/uL (ref 0.0–0.1)
Immature Granulocytes: 0 %
Lymphocytes Absolute: 1.1 10*3/uL (ref 0.7–3.1)
Lymphs: 16 %
MCH: 28.7 pg (ref 26.6–33.0)
MCHC: 33 g/dL (ref 31.5–35.7)
MCV: 87 fL (ref 79–97)
Monocytes Absolute: 0.4 10*3/uL (ref 0.1–0.9)
Monocytes: 6 %
Neutrophils Absolute: 5 10*3/uL (ref 1.4–7.0)
Neutrophils: 75 %
Platelets: 244 10*3/uL (ref 150–450)
RBC: 4.85 x10E6/uL (ref 3.77–5.28)
RDW: 12.3 % (ref 11.7–15.4)
WBC: 6.7 10*3/uL (ref 3.4–10.8)

## 2019-11-24 LAB — LIPID PANEL W/O CHOL/HDL RATIO
Cholesterol, Total: 192 mg/dL (ref 100–199)
HDL: 41 mg/dL (ref >39)
LDL Chol Calc (NIH): 127 mg/dL — ABNORMAL HIGH (ref 0–99)
Triglycerides: 133 mg/dL (ref 0–149)
VLDL Cholesterol Cal: 24 mg/dL (ref 5–40)

## 2019-11-24 LAB — VITAMIN D 25 HYDROXY (VIT D DEFICIENCY, FRACTURES): Vit D, 25-Hydroxy: 41.8 ng/mL (ref 30.0–100.0)

## 2019-11-26 ENCOUNTER — Encounter: Payer: Self-pay | Admitting: Family Medicine

## 2019-11-28 DIAGNOSIS — R413 Other amnesia: Secondary | ICD-10-CM | POA: Diagnosis not present

## 2019-11-28 DIAGNOSIS — G4733 Obstructive sleep apnea (adult) (pediatric): Secondary | ICD-10-CM | POA: Diagnosis not present

## 2019-12-18 ENCOUNTER — Other Ambulatory Visit: Payer: Self-pay | Admitting: Family Medicine

## 2019-12-18 DIAGNOSIS — Z13820 Encounter for screening for osteoporosis: Secondary | ICD-10-CM

## 2020-01-01 ENCOUNTER — Other Ambulatory Visit: Payer: Medicare Other

## 2020-01-24 ENCOUNTER — Other Ambulatory Visit: Payer: Self-pay | Admitting: Family Medicine

## 2020-01-24 NOTE — Telephone Encounter (Signed)
Requested medication (s) are due for refill today: yes  Requested medication (s) are on the active medication list: yes  Last refill:  10/28/19 #12 0 refills  Future visit scheduled: yes  Notes to clinic:  not delegated per protocol, no labs noted for phosphate.      Requested Prescriptions  Pending Prescriptions Disp Refills   Vitamin D, Ergocalciferol, (DRISDOL) 1.25 MG (50000 UNIT) CAPS capsule [Pharmacy Med Name: VITAMIN D2 1.25MG (50,000 UNIT)] 12 capsule 0    Sig: Take 1 capsule (50,000 Units total) by mouth every 7 (seven) days.      Endocrinology:  Vitamins - Vitamin D Supplementation Failed - 01/24/2020 12:14 PM      Failed - 50,000 IU strengths are not delegated      Failed - Phosphate in normal range and within 360 days    No results found for: PHOS        Passed - Ca in normal range and within 360 days    Calcium  Date Value Ref Range Status  11/23/2019 9.5 8.7 - 10.3 mg/dL Final          Passed - Vitamin D in normal range and within 360 days    Vit D, 25-Hydroxy  Date Value Ref Range Status  11/23/2019 41.8 30.0 - 100.0 ng/mL Final    Comment:    Vitamin D deficiency has been defined by the Institute of Medicine and an Endocrine Society practice guideline as a level of serum 25-OH vitamin D less than 20 ng/mL (1,2). The Endocrine Society went on to further define vitamin D insufficiency as a level between 21 and 29 ng/mL (2). 1. IOM (Institute of Medicine). 2010. Dietary reference    intakes for calcium and D. [place]: The    [employer]. 2. Holick MF, Binkley [place], Bischoff-Ferrari HA, et al.    Evaluation, treatment, and prevention of vitamin D    deficiency: an Endocrine Society clinical practice    guideline. JCEM. 2011 Jul; 96(7):1911-30.           Passed - Valid encounter within last 12 months    Recent Outpatient Visits           2 months ago Impaired fasting glucose   [facility] [place], [name], DO   8  months ago Essential (primary) hypertension   [facility] [place], [place], DO   1 year ago Cellulitis of toe of right foot   [facility] Gabriel Cirri, NP   1 year ago Flu vaccine need   [facility] [place], [name], DO   1 year ago Medicare annual wellness visit, subsequent   [employer], [place], DO       Future Appointments             In 3 months  [facility]   In 4 months Johnson, Oralia Rud, DO [employer] [facility]

## 2020-01-28 NOTE — Telephone Encounter (Signed)
Does she need blood work or can this be refileld?

## 2020-01-28 NOTE — Telephone Encounter (Signed)
Patient notified

## 2020-01-28 NOTE — Telephone Encounter (Signed)
She does not need this- her vitamin D is normal. He can just take 1000-2000IU D3 OTC daily.

## 2020-01-30 ENCOUNTER — Other Ambulatory Visit: Payer: Self-pay | Admitting: Family Medicine

## 2020-01-30 NOTE — Telephone Encounter (Signed)
Patient has been previously notified she no longer needs this prescription per chart note. Refusing at this time.

## 2020-02-14 ENCOUNTER — Other Ambulatory Visit: Payer: Self-pay | Admitting: Family Medicine

## 2020-02-14 ENCOUNTER — Encounter: Payer: Self-pay | Admitting: Family Medicine

## 2020-02-14 ENCOUNTER — Telehealth (INDEPENDENT_AMBULATORY_CARE_PROVIDER_SITE_OTHER): Payer: Medicare PPO | Admitting: Family Medicine

## 2020-02-14 ENCOUNTER — Other Ambulatory Visit: Payer: Self-pay

## 2020-02-14 DIAGNOSIS — Z20822 Contact with and (suspected) exposure to covid-19: Secondary | ICD-10-CM

## 2020-02-14 MED ORDER — BENZONATATE 200 MG PO CAPS: 200.0000 mg | ORAL_CAPSULE | Freq: Two times a day (BID) | ORAL | 0 refills | Status: DC | PRN

## 2020-02-14 MED ORDER — PREDNISONE 50 MG PO TABS: 50.0000 mg | ORAL_TABLET | Freq: Every day | ORAL | 0 refills | Status: DC

## 2020-02-14 NOTE — Addendum Note (Signed)
Addended by: Enedina Finner on: 02/14/2020 04:18 PM   Modules accepted: Orders

## 2020-02-14 NOTE — Telephone Encounter (Signed)
Requested medication (s) are due for refill today:   Provider to determine  Requested medication (s) are on the active medication list:   Yes  Future visit scheduled:   Yes   Last ordered: 10/28/2019 #12, 0 refills  Non delegated refill   Requested Prescriptions  Pending Prescriptions Disp Refills   Vitamin D, Ergocalciferol, (DRISDOL) 1.25 MG (50000 UNIT) CAPS capsule [Pharmacy Med Name: VITAMIN D2 1.25MG (50,000 UNIT)] 12 capsule 0    Sig: Take 1 capsule (50,000 Units total) by mouth every 7 (seven) days.      Endocrinology:  Vitamins - Vitamin D Supplementation Failed - 02/14/2020  2:30 PM      Failed - 50,000 IU strengths are not delegated      Failed - Phosphate in normal range and within 360 days    No results found for: PHOS        Passed - Ca in normal range and within 360 days    Calcium  Date Value Ref Range Status  11/23/2019 9.5 8.7 - 10.3 mg/dL Final          Passed - Vitamin D in normal range and within 360 days    Vit D, 25-Hydroxy  Date Value Ref Range Status  11/23/2019 41.8 30.0 - 100.0 ng/mL Final    Comment:    Vitamin D deficiency has been defined by the Institute of Medicine and an Endocrine Society practice guideline as a level of serum 25-OH vitamin D less than 20 ng/mL (1,2). The Endocrine Society went on to further define vitamin D insufficiency as a level between 21 and 29 ng/mL (2). 1. IOM (Institute of Medicine). 2010. Dietary reference    intakes for calcium and D. [place]: The    [employer]. 2. Holick MF, Binkley [place], Bischoff-Ferrari HA, et al.    Evaluation, treatment, and prevention of vitamin D    deficiency: an Endocrine Society clinical practice    guideline. JCEM. 2011 Jul; 96(7):1911-30.           Passed - Valid encounter within last 12 months    Recent Outpatient Visits           Today Suspected COVID-19 virus infection   [facility] [place], [name], DO   2 months ago Impaired fasting  glucose   [facility], [name], DO   9 months ago Essential (primary) hypertension   [facility] [place], [place], DO   1 year ago Cellulitis of toe of right foot   [facility] Gabriel Cirri, NP   1 year ago Flu vaccine need   [facility] [place], Oralia Rud, DO       Future Appointments             In 2 months  [employer]   In 3 months Johnson, Oralia Rud, DO [employer] [facility]

## 2020-02-14 NOTE — Progress Notes (Signed)
There were no vitals taken for this visit.   Subjective:    Patient ID: [name] [name], female    DOB: 04-16-1950, 70 y.o.   MRN: [id number]  HPI: [name] [name] is a 70 y.o. female  Chief Complaint  Patient presents with  . Covid Exposure   UPPER RESPIRATORY TRACT INFECTION- was not able to get a test, but husband is COVID + Duration: 2 days Worst symptom: stuffy nose, sore throat Fever: no Cough: yes Shortness of breath: no Wheezing: no Chest pain: no Chest tightness: yes Chest congestion: no Nasal congestion: yes Runny nose: yes Post nasal drip: yes Sneezing: no Sore throat: yes Swollen glands: no Sinus pressure: yes Headache: yes Face pain: yes Toothache: no Ear pain: no  Ear pressure: no  Eyes red/itching:no Eye drainage/crusting: no  Vomiting: no Rash: no Fatigue: yes Sick contacts: yes Strep contacts: no  Context: stable Recurrent sinusitis: no Relief with OTC cold/cough medications: no  Treatments attempted: none   Relevant past medical, surgical, family and social history reviewed and updated as indicated. Interim medical history since our last visit reviewed. Allergies and medications reviewed and updated.  Review of Systems  Constitutional: Positive for fatigue. Negative for activity change, appetite change, chills, diaphoresis, fever and unexpected weight change.  HENT: Positive for congestion, postnasal drip, rhinorrhea, sinus pressure and sinus pain. Negative for dental problem, drooling, ear discharge, ear pain, facial swelling, hearing loss, mouth sores, nosebleeds, sneezing, sore throat, tinnitus, trouble swallowing and voice change.   Eyes: Negative.   Respiratory: Positive for cough. Negative for apnea, choking, chest tightness, shortness of breath, wheezing and stridor.   Cardiovascular: Negative.   Gastrointestinal: Negative.   Musculoskeletal: Negative.   Psychiatric/Behavioral: Negative.     Per HPI unless specifically indicated  above     Objective:    There were no vitals taken for this visit.  Wt Readings from Last 3 Encounters:  11/23/19 182 lb (82.6 kg)  05/10/19 192 lb 9.6 oz (87.4 kg)  11/09/18 199 lb (90.3 kg)    Physical Exam Vitals and nursing note reviewed.  Constitutional:      General: She is not in acute distress.    Appearance: Normal appearance. She is not ill-appearing, toxic-appearing or diaphoretic.  HENT:     Head: Normocephalic and atraumatic.     Right Ear: External ear normal.     Left Ear: External ear normal.     Nose: Nose normal.     Mouth/Throat:     Mouth: Mucous membranes are moist.     Pharynx: Oropharynx is clear.  Eyes:     General: No scleral icterus.       Right eye: No discharge.        Left eye: No discharge.     Conjunctiva/sclera: Conjunctivae normal.     Pupils: Pupils are equal, round, and reactive to light.  Pulmonary:     Effort: Pulmonary effort is normal. No respiratory distress.     Comments: Speaking in full sentences Musculoskeletal:        General: Normal range of motion.     Cervical back: Normal range of motion.  Skin:    Coloration: Skin is not jaundiced or pale.     Findings: No bruising, erythema, lesion or rash.  Neurological:     Mental Status: She is alert and oriented to person, place, and time. Mental status is at baseline.  Psychiatric:        Mood and Affect: Mood  normal.        Behavior: Behavior normal.        Thought Content: Thought content normal.        Judgment: Judgment normal.     Results for orders placed or performed in visit on 11/23/19  CBC with Differential/Platelet  Result Value Ref Range   WBC 6.7 3.4 - 10.8 x10E3/uL   RBC 4.85 3.77 - 5.28 x10E6/uL   Hemoglobin 13.9 11.1 - 15.9 g/dL   Hematocrit 27.0 62.3 - 46.6 %   MCV 87 79 - 97 fL   MCH 28.7 26.6 - 33.0 pg   MCHC 33.0 31.5 - 35.7 g/dL   RDW 76.2 83.1 - 51.7 %   Platelets 244 150 - 450 x10E3/uL   Neutrophils 75 Not Estab. %   Lymphs 16 Not Estab. %    Monocytes 6 Not Estab. %   Eos 2 Not Estab. %   Basos 1 Not Estab. %   Neutrophils Absolute 5.0 1.4 - 7.0 x10E3/uL   Lymphocytes Absolute 1.1 0.7 - 3.1 x10E3/uL   Monocytes Absolute 0.4 0.1 - 0.9 x10E3/uL   EOS (ABSOLUTE) 0.1 0.0 - 0.4 x10E3/uL   Basophils Absolute 0.1 0.0 - 0.2 x10E3/uL   Immature Granulocytes 0 Not Estab. %   Immature Grans (Abs) 0.0 0.0 - 0.1 x10E3/uL  Comprehensive metabolic panel  Result Value Ref Range   Glucose 153 (H) 65 - 99 mg/dL   BUN 16 8 - 27 mg/dL   Creatinine, Ser 6.16 0.57 - 1.00 mg/dL   GFR calc non Af Amer 59 (L) >59 mL/min/1.73   GFR calc Af Amer 68 >59 mL/min/1.73   BUN/Creatinine Ratio 16 12 - 28   Sodium 141 134 - 144 mmol/L   Potassium 3.5 3.5 - 5.2 mmol/L   Chloride 101 96 - 106 mmol/L   CO2 25 20 - 29 mmol/L   Calcium 9.5 8.7 - 10.3 mg/dL   Total Protein 6.4 6.0 - 8.5 g/dL   Albumin 4.4 3.8 - 4.8 g/dL   Globulin, Total 2.0 1.5 - 4.5 g/dL   Albumin/Globulin Ratio 2.2 1.2 - 2.2   Bilirubin Total 0.4 0.0 - 1.2 mg/dL   Alkaline Phosphatase 79 44 - 121 IU/L   AST 12 0 - 40 IU/L   ALT 12 0 - 32 IU/L  Lipid Panel w/o Chol/HDL Ratio  Result Value Ref Range   Cholesterol, Total 192 100 - 199 mg/dL   Triglycerides 073 0 - 149 mg/dL   HDL 41 >71 mg/dL   VLDL Cholesterol Cal 24 5 - 40 mg/dL   LDL Chol Calc (NIH) 062 (H) 0 - 99 mg/dL  VITAMIN D 25 Hydroxy (Vit-D Deficiency, Fractures)  Result Value Ref Range   Vit D, 25-Hydroxy 41.8 30.0 - 100.0 ng/mL  Bayer DCA Hb A1c Waived  Result Value Ref Range   HB A1C (BAYER DCA - WAIVED) 5.1 <7.0 %      Assessment & Plan:   Problem List Items Addressed This Visit   None   Visit Diagnoses    Suspected COVID-19 virus infection    -  Primary   Will get swabbed. Self-quarantine until results are back. Will treat for comfort with prednisone and tessalon. Call with any concerns. Continue to monitor.   Relevant Orders   Novel Coronavirus, NAA ([facility])       Follow up plan: Return if symptoms  worsen or fail to improve.  . This visit was completed via MyChart due to the restrictions  of the COVID-19 pandemic. All issues as above were discussed and addressed. Physical exam was done as above through visual confirmation on MyChart. If it was felt that the patient should be evaluated in the office, they were directed there. The patient verbally consented to this visit. . Location of the patient: home . Location of the provider: home . Those involved with this call:  . Provider: Olevia Perches, DO . CMA: Rolley Sims, CMA . Front Desk/Registration: Harriet Pho  . Time spent on call: 15 minutes with patient face to face via video conference. More than 50% of this time was spent in counseling and coordination of care. 23 minutes total spent in review of patient's record and preparation of their chart.

## 2020-02-16 ENCOUNTER — Other Ambulatory Visit: Payer: Self-pay | Admitting: Family Medicine

## 2020-02-16 DIAGNOSIS — U071 COVID-19: Secondary | ICD-10-CM

## 2020-02-16 LAB — NOVEL CORONAVIRUS, NAA: SARS-CoV-2, NAA: DETECTED — AB

## 2020-02-16 LAB — SARS-COV-2, NAA 2 DAY TAT

## 2020-02-16 NOTE — Progress Notes (Signed)
Referral to MAB infusions placed. If she qualifies she will be contacted within 24-48 hours, if she does not qualify, she will not be contacted

## 2020-02-18 ENCOUNTER — Telehealth: Payer: Self-pay

## 2020-02-18 NOTE — Telephone Encounter (Signed)
Patient notified of positive COVID-19 test results. Pt verbalized understanding. Pt reports symptoms of cough, nasal congestion Criteria for self-isolation if you test positive for COVID-19, regardless of vaccination status:  -If you have mild symptoms that are resolving or have resolved, isolate at home for 5 days since symptoms started AND continue to wear a well-fitted mask when around others in the home and in public for 5 additional days after isolation is completed -If you have a fever and/or moderate to severe symptoms, isolate for at least 10 days since the symptoms started AND until you are fever free for at least 24 hours without the use of fever-reducing medications -If you tested positive and did not have symptoms, isolate for at least 5 days after your positive test  Use over-the-counter medications for symptoms.If you develop respiratory issues/distress, seek medical care in the Emergency Department.  If you must leave home or if you have to be around others please wear a mask. Please limit contact with immediate family members in the home, practice social distancing, frequent handwashing and clean hard surfaces touched frequently with household cleaning products. Members of your household will also need to quarantine and test.You may also be contacted by the health department for follow up.

## 2020-04-30 ENCOUNTER — Ambulatory Visit: Payer: Medicare PPO

## 2020-05-02 ENCOUNTER — Ambulatory Visit (INDEPENDENT_AMBULATORY_CARE_PROVIDER_SITE_OTHER): Payer: Medicare PPO

## 2020-05-02 VITALS — Ht 63.0 in | Wt 173.0 lb

## 2020-05-02 DIAGNOSIS — Z0000 Encounter for general adult medical examination without abnormal findings: Secondary | ICD-10-CM

## 2020-05-02 NOTE — Progress Notes (Signed)
I connected with Keundra Hersh today by telephone and verified that I am speaking with the correct person using two identifiers. Location patient: home Location provider: work Persons participating in the virtual visit: Catalaya [place], Annia BeltLarry Inman (spouse), Elisha PonderNickeah Samrat Hayward LPN.   I discussed the limitations, risks, security and privacy concerns of performing an evaluation and management service by telephone and the availability of in person appointments. I also discussed with the patient that there may be a patient responsible charge related to this service. The patient expressed understanding and verbally consented to this telephonic visit.    Interactive audio and video telecommunications were attempted between this provider and patient, however failed, due to patient having technical difficulties OR patient did not have access to video capability.  We continued and completed visit with audio only.     Vital signs may be patient reported or missing.  Subjective:   [name] [name] is a 70 y.o. female who presents for Medicare Annual (Subsequent) preventive examination.  Review of Systems     Cardiac Risk Factors include: advanced age (>4655men, 72>65 women);hypertension;sedentary lifestyle     Objective:    Today's Vitals   05/02/20 1426  Weight: 173 lb (78.5 kg)  Height: 5\' 3"  (1.6 m)   Body mass index is 30.65 kg/m.  Advanced Directives 05/02/2020 04/04/2019 01/07/2016 01/06/2015 07/23/2014 06/25/2014  Does Patient Have a Medical Advance Directive? Yes Yes Yes No No No  Type of Estate agentAdvance Directive Healthcare Power of [place];Living will Living will;Healthcare Power of [employer] Street Corporationttorney Healthcare Power of [place];Living will - - -  Does patient want to make changes to medical advance directive? - - - Yes - information given - -  Copy of Healthcare Power of Attorney in Chart? No - copy requested No - copy requested No - copy requested No - copy requested - -  Would patient like information on creating a  medical advance directive? - - - Yes - Educational materials given Yes - Educational materials given No - patient declined information    Current Medications (verified) Outpatient Encounter Medications as of 05/02/2020  Medication Sig  . ARIPiprazole (ABILIFY) 5 MG tablet Take 1 tablet (5 mg total) by mouth daily.  Marland Kitchen. buPROPion (WELLBUTRIN XL) 300 MG 24 hr tablet Take 1 tablet (300 mg total) by mouth daily.  . clotrimazole-betamethasone (LOTRISONE) cream Apply 1 application topically 2 (two) times daily.  . hydrochlorothiazide (HYDRODIURIL) 25 MG tablet Take 1 tablet (25 mg total) by mouth daily.  . hydrOXYzine (ATARAX/VISTARIL) 25 MG tablet Take 1 tablet (25 mg total) by mouth 3 (three) times daily as needed.  . naproxen (NAPROSYN) 500 MG tablet Take 1 tablet (500 mg total) by mouth 2 (two) times daily with a meal.  . venlafaxine XR (EFFEXOR-XR) 150 MG 24 hr capsule Take 1 capsule (150 mg total) by mouth at bedtime.  . benzonatate (TESSALON) 200 MG capsule Take 1 capsule (200 mg total) by mouth 2 (two) times daily as needed for cough. (Patient not taking: Reported on 05/02/2020)  . donepezil (ARICEPT) 5 MG tablet Take by mouth.  . predniSONE (DELTASONE) 50 MG tablet Take 1 tablet (50 mg total) by mouth daily with breakfast. (Patient not taking: Reported on 05/02/2020)  . Vitamin D, Ergocalciferol, (DRISDOL) 1.25 MG (50000 UNIT) CAPS capsule Take 1 capsule (50,000 Units total) by mouth every 7 (seven) days. (Patient not taking: Reported on 05/02/2020)   No facility-administered encounter medications on file as of 05/02/2020.    Allergies (verified) Codeine and Tussionex pennkinetic  er [employer] polst-cpm polst er]   History: Past Medical History:  Diagnosis Date  . Hypertension   . Migraines    Past Surgical History:  Procedure Laterality Date  . ABDOMINAL HYSTERECTOMY    . CARPAL TUNNEL RELEASE    . CESAREAN SECTION    . CHOLECYSTECTOMY    . HAMMER TOE SURGERY Bilateral    Family History   Problem Relation Age of Onset  . Heart disease Mother   . Heart attack Father   . Hypertension Father   . Cancer Paternal Aunt   . Breast cancer Paternal Aunt    Social History   Socioeconomic History  . Marital status: Married    Spouse name: Not on file  . Number of children: Not on file  . Years of education: Not on file  . Highest education level: Not on file  Occupational History  . Not on file  Tobacco Use  . Smoking status: Never Smoker  . Smokeless tobacco: Never Used  Vaping Use  . Vaping Use: Never used  Substance and Sexual Activity  . Alcohol use: No    Alcohol/week: 0.0 standard drinks  . Drug use: No  . Sexual activity: Not Currently  Other Topics Concern  . Not on file  Social History Narrative   No exercise   Social Determinants of Health   Financial Resource Strain: Low Risk   . Difficulty of Paying Living Expenses: Not hard at all  Food Insecurity: No Food Insecurity  . Worried About Programme researcher, broadcasting/film/video in the Last Year: Never true  . Ran Out of Food in the Last Year: Never true  Transportation Needs: No Transportation Needs  . Lack of Transportation (Medical): No  . Lack of Transportation (Non-Medical): No  Physical Activity: Inactive  . Days of Exercise per Week: 0 days  . Minutes of Exercise per Session: 0 min  Stress: No Stress Concern Present  . Feeling of Stress : Not at all  Social Connections: Not on file    Tobacco Counseling Counseling given: Not Answered   Clinical Intake:  Pre-visit preparation completed: Yes  Pain : No/denies pain     Nutritional Status: BMI > 30  Obese Nutritional Risks: None Diabetes: No  How often do you need to have someone help you when you read instructions, pamphlets, or other written materials from your doctor or pharmacy?: 1 - Never  Diabetic? no  Interpreter Needed?: No  Information entered by :: 12th grade   Activities of Daily Living In your present state of health, do you have  any difficulty performing the following activities: 05/02/2020  Hearing? N  Vision? N  Difficulty concentrating or making decisions? Y  Walking or climbing stairs? N  Dressing or bathing? N  Doing errands, shopping? Y  Preparing Food and eating ? Y  Using the Toilet? N  In the past six months, have you accidently leaked urine? Y  Do you have problems with loss of bowel control? N  Managing your Medications? Y  Managing your Finances? Y  Housekeeping or managing your Housekeeping? Y  Some recent data might be hidden    Patient Care Team: Dorcas Carrow, DO as PCP - General (Family Medicine) Morene Crocker, MD as Referring Physician (Neurology)  Indicate any recent Medical Services you may have received from other than [facility] providers in the past year (date may be approximate).     Assessment:   This is a routine wellness examination for [place].  Hearing/Vision screen No exam data present  Dietary issues and exercise activities discussed: Current Exercise Habits: The patient does not participate in regular exercise at present  Goals    . Exercise 3x per week (30 min per time)    . Patient Stated     05/02/2020, no goals      Depression Screen PHQ 2/9 Scores 05/02/2020 11/23/2019 05/10/2019 04/04/2019 11/09/2018 02/24/2018 11/24/2017  PHQ - 2 Score 0 2 0 5 3 3 2   PHQ- 9 Score - 6 2 15 5 12 4     Fall Risk Fall Risk  05/02/2020 11/23/2019 05/10/2019 04/04/2019 11/09/2018  Falls in the past year? 1 0 0 0 0  Comment slipped out of bed - - - -  Number falls in past yr: 0 0 0 0 0  Injury with Fall? 0 0 0 0 0  Risk for fall due to : Medication side effect No Fall Risks - - -  Follow up Falls evaluation completed;Education provided;Falls prevention discussed Falls evaluation completed - - -    FALL RISK PREVENTION PERTAINING TO THE HOME:  Any stairs in or around the home? Yes  If so, are there any without handrails? No  Home free of loose throw rugs in walkways, pet beds, electrical  cords, etc? Yes  Adequate lighting in your home to reduce risk of falls? Yes   ASSISTIVE DEVICES UTILIZED TO PREVENT FALLS:  Life alert? No  Use of a cane, walker or w/c? No  Grab bars in the bathroom? Yes  Shower chair or bench in shower? No  Elevated toilet seat or a handicapped toilet? Yes   TIMED UP AND GO:  Was the test performed? No .     Cognitive Function:     6CIT Screen 02/24/2018 01/19/2017 01/07/2016  What Year? 0 points 0 points 0 points  What month? 0 points 0 points 0 points  What time? 0 points 0 points 0 points  Count back from 20 0 points 0 points 0 points  Months in reverse 0 points 0 points 0 points  Repeat phrase 6 points 2 points 2 points  Total Score 6 2 2     Immunizations Immunization History  Administered Date(s) Administered  . Fluad Quad(high Dose 65+) 11/09/2018, 11/23/2019  . Influenza, High Dose Seasonal PF 01/07/2016, 10/07/2017  . Influenza,inj,Quad PF,6+ Mos 10/18/2014  . Influenza-Unspecified 11/04/2013, 01/12/2017, 01/17/2017  . Moderna Sars-Covid-2 Vaccination 09/12/2019, 10/12/2019  . Pneumococcal Conjugate-13 01/06/2015  . Pneumococcal Polysaccharide-23 01/07/2016  . Tdap 08/13/2015  . Zoster 03/05/2010    TDAP status: Up to date  Flu Vaccine status: Up to date  Pneumococcal vaccine status: Up to date  Covid-19 vaccine status: Completed vaccines  Qualifies for Shingles Vaccine? Yes   Zostavax completed Yes   Shingrix Completed?: No.    Education has been provided regarding the importance of this vaccine. Patient has been advised to call insurance company to determine out of pocket expense if they have not yet received this vaccine. Advised may also receive vaccine at local pharmacy or Health Dept. Verbalized acceptance and understanding.  Screening Tests Health Maintenance  Topic Date Due  . MAMMOGRAM  04/10/2018  . COVID-19 Vaccine (3 - Booster for Moderna series) 04/10/2020  . INFLUENZA VACCINE  09/01/2020  .  COLONOSCOPY (Pts 45-63yrs Insurance coverage will need to be confirmed)  02/27/2023  . TETANUS/TDAP  08/12/2025  . DEXA SCAN  Completed  . Hepatitis C Screening  Completed  . PNA vac Low Risk Adult  Completed  . HPV VACCINES  Aged Out    Health Maintenance  Health Maintenance Due  Topic Date Due  . MAMMOGRAM  04/10/2018  . COVID-19 Vaccine (3 - Booster for Moderna series) 04/10/2020    Colorectal cancer screening: Type of screening: Colonoscopy. Completed 02/26/2013. Repeat every 10 years  Mammogram status: due  Bone Density status: Completed 02/27/2015.   Lung Cancer Screening: (Low Dose CT Chest recommended if Age 25-80 years, 30 pack-year currently smoking OR have quit w/in 15years.) does not qualify.   Lung Cancer Screening Referral: no  Additional Screening:  Hepatitis C Screening: does qualify; Completed 08/12/2016  Vision Screening: Recommended annual ophthalmology exams for early detection of glaucoma and other disorders of the eye. Is the patient up to date with their annual eye exam?  No  Who is the provider or what is the name of the office in which the patient attends annual eye exams? [facility] If pt is not established with a provider, would they like to be referred to a provider to establish care? No .   Dental Screening: Recommended annual dental exams for proper oral hygiene  Community Resource Referral / Chronic Care Management: CRR required this visit?  No   CCM required this visit?  No      Plan:     I have personally reviewed and noted the following in the patient's chart:   . Medical and social history . Use of alcohol, tobacco or illicit drugs  . Current medications and supplements . Functional ability and status . Nutritional status . Physical activity . Advanced directives . List of other physicians . Hospitalizations, surgeries, and ER visits in previous 12 months . Vitals . Screenings to include cognitive, depression, and  falls . Referrals and appointments  In addition, I have reviewed and discussed with patient certain preventive protocols, quality metrics, and best practice recommendations. A written personalized care plan for preventive services as well as general preventive health recommendations were provided to patient.     Barb Merino, LPN   02/09/7586   Nurse Notes: 6 CIT not administered. Patient has diagnosis of dementia and is followed by neurology.

## 2020-05-02 NOTE — Patient Instructions (Signed)
[name] [name] , Thank you for taking time to come for your Medicare Wellness Visit. I appreciate your ongoing commitment to your health goals. Please review the following plan we discussed and let me know if I can assist you in the future.   Screening recommendations/referrals: Colonoscopy: completed 02/26/2013 Mammogram: due Bone Density: completed 02/27/2015 Recommended yearly ophthalmology/optometry visit for glaucoma screening and checkup Recommended yearly dental visit for hygiene and checkup  Vaccinations: Influenza vaccine: completed 11/23/2019, due 09/01/2020 Pneumococcal vaccine: completed 01/07/2016 Tdap vaccine: completed 08/13/2015, due 08/12/2025 Shingles vaccine: discussed   Covid-19: 10/12/2019, 09/12/2019  Advanced directives: Please bring a copy of your POA (Power of Attorney) and/or Living Will to your next appointment.   Conditions/risks identified: none  Next appointment: Follow up in one year for your annual wellness visit    Preventive Care 65 Years and Older, Female Preventive care refers to lifestyle choices and visits with your health care provider that can promote health and wellness. What does preventive care include?  A yearly physical exam. This is also called an annual well check.  Dental exams once or twice a year.  Routine eye exams. Ask your health care provider how often you should have your eyes checked.  Personal lifestyle choices, including:  Daily care of your teeth and gums.  Regular physical activity.  Eating a healthy diet.  Avoiding tobacco and drug use.  Limiting alcohol use.  Practicing safe sex.  Taking low-dose aspirin every day.  Taking vitamin and mineral supplements as recommended by your health care provider. What happens during an annual well check? The services and screenings done by your health care provider during your annual well check will depend on your age, overall health, lifestyle risk factors, and family history of  disease. Counseling  Your health care provider may ask you questions about your:  Alcohol use.  Tobacco use.  Drug use.  Emotional well-being.  Home and relationship well-being.  Sexual activity.  Eating habits.  History of falls.  Memory and ability to understand (cognition).  Work and work Astronomer.  Reproductive health. Screening  You may have the following tests or measurements:  Height, weight, and BMI.  Blood pressure.  Lipid and cholesterol levels. These may be checked every 5 years, or more frequently if you are over 52 years old.  Skin check.  Lung cancer screening. You may have this screening every year starting at age 33 if you have a 30-pack-year history of smoking and currently smoke or have quit within the past 15 years.  Fecal occult blood test (FOBT) of the stool. You may have this test every year starting at age 23.  Flexible sigmoidoscopy or colonoscopy. You may have a sigmoidoscopy every 5 years or a colonoscopy every 10 years starting at age 43.  Hepatitis C blood test.  Hepatitis B blood test.  Sexually transmitted disease (STD) testing.  Diabetes screening. This is done by checking your blood sugar (glucose) after you have not eaten for a while (fasting). You may have this done every 1-3 years.  Bone density scan. This is done to screen for osteoporosis. You may have this done starting at age 20.  Mammogram. This may be done every 1-2 years. Talk to your health care provider about how often you should have regular mammograms. Talk with your health care provider about your test results, treatment options, and if necessary, the need for more tests. Vaccines  Your health care provider may recommend certain vaccines, such as:  Influenza vaccine. This  is recommended every year.  Tetanus, diphtheria, and acellular pertussis (Tdap, Td) vaccine. You may need a Td booster every 10 years.  Zoster vaccine. You may need this after age  83.  Pneumococcal 13-valent conjugate (PCV13) vaccine. One dose is recommended after age 49.  Pneumococcal polysaccharide (PPSV23) vaccine. One dose is recommended after age 21. Talk to your health care provider about which screenings and vaccines you need and how often you need them. This information is not intended to replace advice given to you by your health care provider. Make sure you discuss any questions you have with your health care provider. Document Released: 02/14/2015 Document Revised: 10/08/2015 Document Reviewed: 11/19/2014 Elsevier Interactive Patient Education  2017 [place] Prevention in the Home Falls can cause injuries. They can happen to people of all ages. There are many things you can do to make your home safe and to help prevent falls. What can I do on the outside of my home?  Regularly fix the edges of walkways and driveways and fix any cracks.  Remove anything that might make you trip as you walk through a door, such as a raised step or threshold.  Trim any bushes or trees on the path to your home.  Use bright outdoor lighting.  Clear any walking paths of anything that might make someone trip, such as rocks or tools.  Regularly check to see if handrails are loose or broken. Make sure that both sides of any steps have handrails.  Any raised decks and porches should have guardrails on the edges.  Have any leaves, snow, or ice cleared regularly.  Use sand or salt on walking paths during winter.  Clean up any spills in your garage right away. This includes oil or grease spills. What can I do in the bathroom?  Use night lights.  Install grab bars by the toilet and in the tub and shower. Do not use towel bars as grab bars.  Use non-skid mats or decals in the tub or shower.  If you need to sit down in the shower, use a plastic, non-slip stool.  Keep the floor dry. Clean up any water that spills on the floor as soon as it happens.  Remove  soap buildup in the tub or shower regularly.  Attach bath mats securely with double-sided non-slip rug tape.  Do not have throw rugs and other things on the floor that can make you trip. What can I do in the bedroom?  Use night lights.  Make sure that you have a light by your bed that is easy to reach.  Do not use any sheets or blankets that are too big for your bed. They should not hang down onto the floor.  Have a firm chair that has side arms. You can use this for support while you get dressed.  Do not have throw rugs and other things on the floor that can make you trip. What can I do in the kitchen?  Clean up any spills right away.  Avoid walking on wet floors.  Keep items that you use a lot in easy-to-reach places.  If you need to reach something above you, use a strong step stool that has a grab bar.  Keep electrical cords out of the way.  Do not use floor polish or wax that makes floors slippery. If you must use wax, use non-skid floor wax.  Do not have throw rugs and other things on the floor that can make you  trip. What can I do with my stairs?  Do not leave any items on the stairs.  Make sure that there are handrails on both sides of the stairs and use them. Fix handrails that are broken or loose. Make sure that handrails are as long as the stairways.  Check any carpeting to make sure that it is firmly attached to the stairs. Fix any carpet that is loose or worn.  Avoid having throw rugs at the top or bottom of the stairs. If you do have throw rugs, attach them to the floor with carpet tape.  Make sure that you have a light switch at the top of the stairs and the bottom of the stairs. If you do not have them, ask someone to add them for you. What else can I do to help prevent falls?  Wear shoes that:  Do not have high heels.  Have rubber bottoms.  Are comfortable and fit you well.  Are closed at the toe. Do not wear sandals.  If you use a  stepladder:  Make sure that it is fully opened. Do not climb a closed stepladder.  Make sure that both sides of the stepladder are locked into place.  Ask someone to hold it for you, if possible.  Clearly mark and make sure that you can see:  Any grab bars or handrails.  First and last steps.  Where the edge of each step is.  Use tools that help you move around (mobility aids) if they are needed. These include:  Canes.  Walkers.  Scooters.  Crutches.  Turn on the lights when you go into a dark area. Replace any light bulbs as soon as they burn out.  Set up your furniture so you have a clear path. Avoid moving your furniture around.  If any of your floors are uneven, fix them.  If there are any pets around you, be aware of where they are.  Review your medicines with your doctor. Some medicines can make you feel dizzy. This can increase your chance of falling. Ask your doctor what other things that you can do to help prevent falls. This information is not intended to replace advice given to you by your health care provider. Make sure you discuss any questions you have with your health care provider. Document Released: 11/14/2008 Document Revised: 06/26/2015 Document Reviewed: 02/22/2014 Elsevier Interactive Patient Education  2017 [employer].

## 2020-05-20 ENCOUNTER — Telehealth: Payer: Self-pay

## 2020-05-20 NOTE — Telephone Encounter (Signed)
FYI

## 2020-05-20 NOTE — Telephone Encounter (Signed)
Copied from CRM [id number]. Topic: General - Other >> May 20, 2020  9:58 AM Marylen Ponto wrote: Reason for CRM: Amber with [facility] reports that patient has been notified of the risk-vs-benefit of ARIPiprazole (ABILIFY) 5 MG tablet. Cb# [phone]

## 2020-05-22 ENCOUNTER — Other Ambulatory Visit: Payer: Self-pay | Admitting: Family Medicine

## 2020-05-23 ENCOUNTER — Encounter: Payer: Self-pay | Admitting: Family Medicine

## 2020-05-23 ENCOUNTER — Other Ambulatory Visit: Payer: Self-pay

## 2020-05-23 ENCOUNTER — Ambulatory Visit (INDEPENDENT_AMBULATORY_CARE_PROVIDER_SITE_OTHER): Payer: Medicare PPO | Admitting: Family Medicine

## 2020-05-23 VITALS — BP 131/82 | HR 68 | Temp 98.1°F | Ht 61.75 in | Wt 175.2 lb

## 2020-05-23 DIAGNOSIS — Z0000 Encounter for general adult medical examination without abnormal findings: Secondary | ICD-10-CM

## 2020-05-23 DIAGNOSIS — E782 Mixed hyperlipidemia: Secondary | ICD-10-CM | POA: Diagnosis not present

## 2020-05-23 DIAGNOSIS — E559 Vitamin D deficiency, unspecified: Secondary | ICD-10-CM

## 2020-05-23 DIAGNOSIS — F0280 Dementia in other diseases classified elsewhere without behavioral disturbance: Secondary | ICD-10-CM

## 2020-05-23 DIAGNOSIS — N3281 Overactive bladder: Secondary | ICD-10-CM | POA: Diagnosis not present

## 2020-05-23 DIAGNOSIS — I10 Essential (primary) hypertension: Secondary | ICD-10-CM

## 2020-05-23 DIAGNOSIS — R7301 Impaired fasting glucose: Secondary | ICD-10-CM | POA: Diagnosis not present

## 2020-05-23 DIAGNOSIS — G309 Alzheimer's disease, unspecified: Secondary | ICD-10-CM | POA: Diagnosis not present

## 2020-05-23 DIAGNOSIS — F324 Major depressive disorder, single episode, in partial remission: Secondary | ICD-10-CM

## 2020-05-23 LAB — URINALYSIS, ROUTINE W REFLEX MICROSCOPIC
Bilirubin, UA: NEGATIVE
Glucose, UA: NEGATIVE
Ketones, UA: NEGATIVE
Leukocytes,UA: NEGATIVE
Nitrite, UA: NEGATIVE
Protein,UA: NEGATIVE
RBC, UA: NEGATIVE
Specific Gravity, UA: <1.005 — ABNORMAL LOW (ref 1.005–1.030)
Urobilinogen, Ur: 0.2 mg/dL (ref 0.2–1.0)
pH, UA: 6 (ref 5.0–7.5)

## 2020-05-23 LAB — MICROALBUMIN, URINE WAIVED
Creatinine, Urine Waived: 50 mg/dL (ref 10–300)
Microalb, Ur Waived: 10 mg/L (ref 0–19)
Microalb/Creat Ratio: <30 mg/g (ref <30)

## 2020-05-23 LAB — BAYER DCA HB A1C WAIVED: HB A1C (BAYER DCA - WAIVED): 5.3 % (ref <7.0)

## 2020-05-23 MED ORDER — NAPROXEN 500 MG PO TABS: 500.0000 mg | ORAL_TABLET | Freq: Two times a day (BID) | ORAL | 1 refills | Status: DC

## 2020-05-23 MED ORDER — VENLAFAXINE HCL ER 150 MG PO CP24: 150.0000 mg | ORAL_CAPSULE | Freq: Every day | ORAL | 1 refills | Status: DC

## 2020-05-23 MED ORDER — HYDROXYZINE HCL 25 MG PO TABS: 25.0000 mg | ORAL_TABLET | Freq: Three times a day (TID) | ORAL | 3 refills | Status: DC | PRN

## 2020-05-23 MED ORDER — HYDROCHLOROTHIAZIDE 25 MG PO TABS: 25.0000 mg | ORAL_TABLET | Freq: Every day | ORAL | 1 refills | Status: DC

## 2020-05-23 MED ORDER — ARIPIPRAZOLE 5 MG PO TABS: 5.0000 mg | ORAL_TABLET | Freq: Every day | ORAL | 1 refills | Status: DC

## 2020-05-23 MED ORDER — BUPROPION HCL ER (XL) 300 MG PO TB24: ORAL_TABLET | ORAL | 1 refills | Status: DC

## 2020-05-23 NOTE — Assessment & Plan Note (Signed)
Rechecking labs today. Await results.  

## 2020-05-23 NOTE — Assessment & Plan Note (Signed)
Under good control on current regimen. Continue current regimen. Continue to monitor. Call with any concerns. Refills given. Labs drawn today.   

## 2020-05-23 NOTE — Assessment & Plan Note (Signed)
Following with neurology. Has lots of help with her husband. Call with any concerns. Continue to monitor.

## 2020-05-23 NOTE — Assessment & Plan Note (Signed)
Rechecking urine today. Await results.  

## 2020-05-23 NOTE — Progress Notes (Signed)
BP 131/82   Pulse 68   Temp 98.1 F (36.7 C)   Ht 5' 1.75" (1.568 m)   Wt 175 lb 3.2 oz (79.5 kg)   SpO2 98%   BMI 32.30 kg/m    Subjective:    Patient ID: [name] [name], female    DOB: Jul 31, 1950, 70 y.o.   MRN: [id number]  HPI: [name] [name] is a 70 y.o. female presenting on 05/23/2020 for comprehensive medical examination. Current medical complaints include:  HYPERTENSION / HYPERLIPIDEMIA Satisfied with current treatment? yes Duration of hypertension: chronic BP monitoring frequency: not checking BP medication side effects: no Past BP meds: HCTZ Duration of hyperlipidemia: chronic Cholesterol medication side effects: not on anything Cholesterol supplements: none Past cholesterol medications: none Medication compliance: excellent compliance Aspirin: no Recent stressors: no Recurrent headaches: no Visual changes: no Palpitations: no Dyspnea: no Chest pain: no Lower extremity edema: no Dizzy/lightheaded: no  Impaired Fasting Glucose HbA1C:  Lab Results  Component Value Date   HGBA1C 5.1 11/23/2019   Duration of elevated blood sugar: chronic Polydipsia: no Polyuria: no Weight change: no Visual disturbance: no Glucose Monitoring: no Diabetic Education: Not Completed Family history of diabetes: yes  DEPRESSION Mood status: stable Satisfied with current treatment?: yes Symptom severity: mild  Duration of current treatment : chronic Side effects: no Medication compliance: excellent compliance Psychotherapy/counseling: no  Previous psychiatric medications: effexor, wellbutrin, abilify Depressed mood: yes Anxious mood: yes Anhedonia: no Significant weight loss or gain: no Insomnia: no  Fatigue: yes Feelings of worthlessness or guilt: no Impaired concentration/indecisiveness: no Suicidal ideations: no Hopelessness: no Crying spells: no Depression screen [facility] 2/9 05/23/2020 05/23/2020 05/02/2020 11/23/2019 05/10/2019  Decreased Interest 1 1 0 1 0  Down,  Depressed, Hopeless 1 0 0 1 0  PHQ - 2 Score 2 1 0 2 0  Altered sleeping 0 0 - 0 2  Tired, decreased energy 3 1 - 2 0  Change in appetite 0 0 - 0 0  Feeling bad or failure about yourself  0 0 - 0 0  Trouble concentrating 3 3 - 2 0  Moving slowly or fidgety/restless 1 2 - 0 0  Suicidal thoughts 0 0 - 0 0  PHQ-9 Score 9 7 - 6 2  Difficult doing work/chores Somewhat difficult Very difficult - Somewhat difficult Not difficult at all  Some recent data might be hidden   She currently lives with: husband Menopausal Symptoms: no  Depression Screen done today and results listed below:  Depression screen [facility] 2/9 05/23/2020 05/23/2020 05/02/2020 11/23/2019 05/10/2019  Decreased Interest 1 1 0 1 0  Down, Depressed, Hopeless 1 0 0 1 0  PHQ - 2 Score 2 1 0 2 0  Altered sleeping 0 0 - 0 2  Tired, decreased energy 3 1 - 2 0  Change in appetite 0 0 - 0 0  Feeling bad or failure about yourself  0 0 - 0 0  Trouble concentrating 3 3 - 2 0  Moving slowly or fidgety/restless 1 2 - 0 0  Suicidal thoughts 0 0 - 0 0  PHQ-9 Score 9 7 - 6 2  Difficult doing work/chores Somewhat difficult Very difficult - Somewhat difficult Not difficult at all  Some recent data might be hidden    Past Medical History:  Past Medical History:  Diagnosis Date  . Hypertension   . Migraines     Surgical History:  Past Surgical History:  Procedure Laterality Date  . ABDOMINAL HYSTERECTOMY    .  CARPAL TUNNEL RELEASE    . CESAREAN SECTION    . CHOLECYSTECTOMY    . HAMMER TOE SURGERY Bilateral     Medications:  Current Outpatient Medications on File Prior to Visit  Medication Sig  . clotrimazole-betamethasone (LOTRISONE) cream Apply 1 application topically 2 (two) times daily.  . Vitamin D, Ergocalciferol, (DRISDOL) 1.25 MG (50000 UNIT) CAPS capsule Take 1 capsule (50,000 Units total) by mouth every 7 (seven) days.  Marland Kitchen. donepezil (ARICEPT) 5 MG tablet Take by mouth.   No current facility-administered medications on file  prior to visit.    Allergies:  Allergies  Allergen Reactions  . Codeine Nausea And Vomiting and Nausea Only  . Tussionex Pennkinetic Er [Hydrocod Polst-Cpm Polst Er] Nausea And Vomiting    Social History:  Social History   Socioeconomic History  . Marital status: Married    Spouse name: Not on file  . Number of children: Not on file  . Years of education: Not on file  . Highest education level: Not on file  Occupational History  . Not on file  Tobacco Use  . Smoking status: Never Smoker  . Smokeless tobacco: Never Used  Vaping Use  . Vaping Use: Never used  Substance and Sexual Activity  . Alcohol use: No    Alcohol/week: 0.0 standard drinks  . Drug use: No  . Sexual activity: Not Currently  Other Topics Concern  . Not on file  Social History Narrative   No exercise   Social Determinants of Health   Financial Resource Strain: Low Risk   . Difficulty of Paying Living Expenses: Not hard at all  Food Insecurity: No Food Insecurity  . Worried About Programme researcher, broadcasting/film/videounning Out of Food in the Last Year: Never true  . Ran Out of Food in the Last Year: Never true  Transportation Needs: No Transportation Needs  . Lack of Transportation (Medical): No  . Lack of Transportation (Non-Medical): No  Physical Activity: Inactive  . Days of Exercise per Week: 0 days  . Minutes of Exercise per Session: 0 min  Stress: No Stress Concern Present  . Feeling of Stress : Not at all  Social Connections: Not on file  Intimate Partner Violence: Not on file   Social History   Tobacco Use  Smoking Status Never Smoker  Smokeless Tobacco Never Used   Social History   Substance and Sexual Activity  Alcohol Use No  . Alcohol/week: 0.0 standard drinks    Family History:  Family History  Problem Relation Age of Onset  . Heart disease Mother   . Heart attack Father   . Hypertension Father   . Cancer Paternal Aunt   . Breast cancer Paternal Aunt     Past medical history, surgical history,  medications, allergies, family history and social history reviewed with patient today and changes made to appropriate areas of the chart.   Review of Systems  Constitutional: Negative.   HENT: Negative.   Eyes: Negative.   Respiratory: Negative.   Cardiovascular: Negative.   Gastrointestinal: Negative.   Genitourinary: Negative.   Musculoskeletal: Negative.   Skin: Negative.   Neurological: Negative.   Endo/Heme/Allergies: Negative.   Psychiatric/Behavioral: Positive for depression and memory loss. Negative for hallucinations, substance abuse and suicidal ideas. The patient is nervous/anxious. The patient does not have insomnia.    All other ROS negative except what is listed above and in the HPI.      Objective:    BP 131/82   Pulse 68  Temp 98.1 F (36.7 C)   Ht 5' 1.75" (1.568 m)   Wt 175 lb 3.2 oz (79.5 kg)   SpO2 98%   BMI 32.30 kg/m   Wt Readings from Last 3 Encounters:  05/23/20 175 lb 3.2 oz (79.5 kg)  05/02/20 173 lb (78.5 kg)  11/23/19 182 lb (82.6 kg)    Physical Exam Vitals and nursing note reviewed.  Constitutional:      General: She is not in acute distress.    Appearance: Normal appearance. She is not ill-appearing, toxic-appearing or diaphoretic.  HENT:     Head: Normocephalic and atraumatic.     Right Ear: Tympanic membrane, ear canal and external ear normal. There is no impacted cerumen.     Left Ear: Tympanic membrane, ear canal and external ear normal. There is no impacted cerumen.     Nose: Nose normal. No congestion or rhinorrhea.     Mouth/Throat:     Mouth: Mucous membranes are moist.     Pharynx: Oropharynx is clear. No oropharyngeal exudate or posterior oropharyngeal erythema.  Eyes:     General: No scleral icterus.       Right eye: No discharge.        Left eye: No discharge.     Extraocular Movements: Extraocular movements intact.     Conjunctiva/sclera: Conjunctivae normal.     Pupils: Pupils are equal, round, and reactive to light.   Neck:     Vascular: No carotid bruit.  Cardiovascular:     Rate and Rhythm: Normal rate and regular rhythm.     Pulses: Normal pulses.     Heart sounds: No murmur heard. No friction rub. No gallop.   Pulmonary:     Effort: Pulmonary effort is normal. No respiratory distress.     Breath sounds: Normal breath sounds. No stridor. No wheezing, rhonchi or rales.  Chest:     Chest wall: No tenderness.  Abdominal:     General: Abdomen is flat. Bowel sounds are normal. There is no distension.     Palpations: Abdomen is soft. There is no mass.     Tenderness: There is no abdominal tenderness. There is no right CVA tenderness, left CVA tenderness, guarding or rebound.     Hernia: No hernia is present.  Genitourinary:    Comments: Breast and pelvic exams deferred with shared decision making Musculoskeletal:        General: No swelling, tenderness, deformity or signs of injury.     Cervical back: Normal range of motion and neck supple. No rigidity. No muscular tenderness.     Right lower leg: No edema.     Left lower leg: No edema.  Lymphadenopathy:     Cervical: No cervical adenopathy.  Skin:    General: Skin is warm and dry.     Capillary Refill: Capillary refill takes less than 2 seconds.     Coloration: Skin is not jaundiced or pale.     Findings: No bruising, erythema, lesion or rash.  Neurological:     General: No focal deficit present.     Mental Status: She is alert and oriented to person, place, and time. Mental status is at baseline.     Cranial Nerves: No cranial nerve deficit.     Sensory: No sensory deficit.     Motor: No weakness.     Coordination: Coordination normal.     Gait: Gait normal.     Deep Tendon Reflexes: Reflexes normal.  Psychiatric:  Mood and Affect: Mood is anxious and depressed.        Behavior: Behavior normal.        Thought Content: Thought content normal.        Cognition and Memory: Memory is impaired.        Judgment: Judgment normal.      Results for orders placed or performed in visit on 02/14/20  Novel Coronavirus, NAA ([facility])   Specimen: Saline  Result Value Ref Range   SARS-CoV-2, NAA Detected (A) Not Detected  SARS-COV-2, NAA 2 DAY TAT  Result Value Ref Range   SARS-CoV-2, NAA 2 DAY TAT Performed       Assessment & Plan:   Problem List Items Addressed This Visit      Cardiovascular and Mediastinum   Essential (primary) hypertension    Under good control on current regimen. Continue current regimen. Continue to monitor. Call with any concerns. Refills given. Labs drawn today.        Relevant Medications   hydrochlorothiazide (HYDRODIURIL) 25 MG tablet   Other Relevant Orders   CBC with Differential/Platelet   Comprehensive metabolic panel   Microalbumin, Urine Waived     Endocrine   Impaired fasting glucose    Under good control on current regimen. Continue current regimen. Continue to monitor. Call with any concerns. Refills given. Labs drawn today.        Relevant Orders   CBC with Differential/Platelet   Comprehensive metabolic panel   Bayer DCA Hb Z6X Waived   Microalbumin, Urine Waived     Nervous and Auditory   [facility])    Following with neurology. Has lots of help with her husband. Call with any concerns. Continue to monitor.       Relevant Medications   venlafaxine XR (EFFEXOR-XR) 150 MG 24 hr capsule   hydrOXYzine (ATARAX/VISTARIL) 25 MG tablet   buPROPion (WELLBUTRIN XL) 300 MG 24 hr tablet   ARIPiprazole (ABILIFY) 5 MG tablet     Genitourinary   Overactive bladder    Rechecking urine today. Await results.       Relevant Orders   CBC with Differential/Platelet   Comprehensive metabolic panel   Urinalysis, Routine w reflex microscopic     Other   Depression    Under good control on current regimen. Continue current regimen. Continue to monitor. Call with any concerns. Refills given. Labs drawn today.      Relevant Medications   venlafaxine XR (EFFEXOR-XR)  150 MG 24 hr capsule   hydrOXYzine (ATARAX/VISTARIL) 25 MG tablet   buPROPion (WELLBUTRIN XL) 300 MG 24 hr tablet   Other Relevant Orders   CBC with Differential/Platelet   Comprehensive metabolic panel   TSH   Hyperlipidemia    Under good control on current regimen. Continue current regimen. Continue to monitor. Call with any concerns. Refills given. Labs drawn today.       Relevant Medications   hydrochlorothiazide (HYDRODIURIL) 25 MG tablet   Other Relevant Orders   CBC with Differential/Platelet   Comprehensive metabolic panel   Lipid Panel w/o Chol/HDL Ratio   Vitamin D deficiency    Rechecking labs today. Await results.       Relevant Orders   CBC with Differential/Platelet   Comprehensive metabolic panel   VITAMIN D 25 Hydroxy (Vit-D Deficiency, Fractures)    Other Visit Diagnoses    Routine general medical examination at a health care facility    -  Primary   Vaccines up to date. Screening labs  checked today. Mammo ordered. Colonoscopy and DEXA UTD. Continue diet and exercise. Continue to monitor.        Follow up plan: Return in about 6 months (around 11/22/2020).   LABORATORY TESTING:  - Pap smear: not applicable  IMMUNIZATIONS:   - Tdap: Tetanus vaccination status reviewed: last tetanus booster within 10 years. - Influenza: Up to date - Pneumovax: Up to date - Prevnar: Up to date - COVID: Up to date  SCREENING: -Mammogram: Ordered today  - Colonoscopy: Up to date  - Bone Density: Up to date   PATIENT COUNSELING:   Advised to take 1 mg of folate supplement per day if capable of pregnancy.   Sexuality: Discussed sexually transmitted diseases, partner selection, use of condoms, avoidance of unintended pregnancy  and contraceptive alternatives.   Advised to avoid cigarette smoking.  I discussed with the patient that most people either abstain from alcohol or drink within safe limits (<=14/week and <=4 drinks/occasion for males, <=7/weeks and <= 3  drinks/occasion for females) and that the risk for alcohol disorders and other health effects rises proportionally with the number of drinks per week and how often a drinker exceeds daily limits.  Discussed cessation/primary prevention of drug use and availability of treatment for abuse.   Diet: Encouraged to adjust caloric intake to maintain  or achieve ideal body weight, to reduce intake of dietary saturated fat and total fat, to limit sodium intake by avoiding high sodium foods and not adding table salt, and to maintain adequate dietary potassium and calcium preferably from fresh fruits, vegetables, and low-fat dairy products.    stressed the importance of regular exercise  Injury prevention: Discussed safety belts, safety helmets, smoke detector, smoking near bedding or upholstery.   Dental health: Discussed importance of regular tooth brushing, flossing, and dental visits.    NEXT PREVENTATIVE PHYSICAL DUE IN 1 YEAR. Return in about 6 months (around 11/22/2020).

## 2020-05-23 NOTE — Patient Instructions (Signed)
Health Maintenance After Age 71 After age 71, you are at a higher risk for certain long-term diseases and infections as well as injuries from falls. Falls are a major cause of broken bones and head injuries in people who are older than age 71. Getting regular preventive care can help to keep you healthy and well. Preventive care includes getting regular testing and making lifestyle changes as recommended by your health care provider. Talk with your health care provider about:  Which screenings and tests you should have. A screening is a test that checks for a disease when you have no symptoms.  A diet and exercise plan that is right for you. What should I know about screenings and tests to prevent falls? Screening and testing are the best ways to find a health problem early. Early diagnosis and treatment give you the best chance of managing medical conditions that are common after age 71. Certain conditions and lifestyle choices may make you more likely to have a fall. Your health care provider may recommend:  Regular vision checks. Poor vision and conditions such as cataracts can make you more likely to have a fall. If you wear glasses, make sure to get your prescription updated if your vision changes.  Medicine review. Work with your health care provider to regularly review all of the medicines you are taking, including over-the-counter medicines. Ask your health care provider about any side effects that may make you more likely to have a fall. Tell your health care provider if any medicines that you take make you feel dizzy or sleepy.  Osteoporosis screening. Osteoporosis is a condition that causes the bones to get weaker. This can make the bones weak and cause them to break more easily.  Blood pressure screening. Blood pressure changes and medicines to control blood pressure can make you feel dizzy.  Strength and balance checks. Your health care provider may recommend certain tests to check your  strength and balance while standing, walking, or changing positions.  Foot health exam. Foot pain and numbness, as well as not wearing proper footwear, can make you more likely to have a fall.  Depression screening. You may be more likely to have a fall if you have a fear of falling, feel emotionally low, or feel unable to do activities that you used to do.  Alcohol use screening. Using too much alcohol can affect your balance and may make you more likely to have a fall. What actions can I take to lower my risk of falls? General instructions  Talk with your health care provider about your risks for falling. Tell your health care provider if: ? You fall. Be sure to tell your health care provider about all falls, even ones that seem minor. ? You feel dizzy, sleepy, or off-balance.  Take over-the-counter and prescription medicines only as told by your health care provider. These include any supplements.  Eat a healthy diet and maintain a healthy weight. A healthy diet includes low-fat dairy products, low-fat (lean) meats, and fiber from whole grains, beans, and lots of fruits and vegetables. Home safety  Remove any tripping hazards, such as rugs, cords, and clutter.  Install safety equipment such as grab bars in bathrooms and safety rails on stairs.  Keep rooms and walkways well-lit. Activity  Follow a regular exercise program to stay fit. This will help you maintain your balance. Ask your health care provider what types of exercise are appropriate for you.  If you need a cane or walker,   use it as recommended by your health care provider.  Wear supportive shoes that have nonskid soles.   Lifestyle  Do not drink alcohol if your health care provider tells you not to drink.  If you drink alcohol, limit how much you have: ? 0-1 drink a day for women. ? 0-2 drinks a day for men.  Be aware of how much alcohol is in your drink. In the [place] one drink equals one typical bottle of beer (12  oz), one-half glass of wine (5 oz), or one shot of hard liquor (1 oz).  Do not use any products that contain nicotine or tobacco, such as cigarettes and e-cigarettes. If you need help quitting, ask your health care provider. Summary  Having a healthy lifestyle and getting preventive care can help to protect your health and wellness after age 71.  Screening and testing are the best way to find a health problem early and help you avoid having a fall. Early diagnosis and treatment give you the best chance for managing medical conditions that are more common for people who are older than age 71.  Falls are a major cause of broken bones and head injuries in people who are older than age 71. Take precautions to prevent a fall at home.  Work with your health care provider to learn what changes you can make to improve your health and wellness and to prevent falls. This information is not intended to replace advice given to you by your health care provider. Make sure you discuss any questions you have with your health care provider. Document Revised: 05/11/2018 Document Reviewed: 12/01/2016 Elsevier Patient Education  2021 [employer]  

## 2020-05-24 LAB — CBC WITH DIFFERENTIAL/PLATELET
Basophils Absolute: 0.1 10*3/uL (ref 0.0–0.2)
Basos: 1 %
EOS (ABSOLUTE): 0.2 10*3/uL (ref 0.0–0.4)
Eos: 3 %
Hematocrit: 40.4 % (ref 34.0–46.6)
Hemoglobin: 12.8 g/dL (ref 11.1–15.9)
Immature Grans (Abs): 0 10*3/uL (ref 0.0–0.1)
Immature Granulocytes: 0 %
Lymphocytes Absolute: 1.1 10*3/uL (ref 0.7–3.1)
Lymphs: 22 %
MCH: 26.4 pg — ABNORMAL LOW (ref 26.6–33.0)
MCHC: 31.7 g/dL (ref 31.5–35.7)
MCV: 84 fL (ref 79–97)
Monocytes Absolute: 0.4 10*3/uL (ref 0.1–0.9)
Monocytes: 8 %
Neutrophils Absolute: 3.4 10*3/uL (ref 1.4–7.0)
Neutrophils: 66 %
Platelets: 228 10*3/uL (ref 150–450)
RBC: 4.84 x10E6/uL (ref 3.77–5.28)
RDW: 13.2 % (ref 11.7–15.4)
WBC: 5.1 10*3/uL (ref 3.4–10.8)

## 2020-05-24 LAB — COMPREHENSIVE METABOLIC PANEL
ALT: 16 IU/L (ref 0–32)
AST: 17 IU/L (ref 0–40)
Albumin/Globulin Ratio: 1.9 (ref 1.2–2.2)
Albumin: 4.1 g/dL (ref 3.8–4.8)
Alkaline Phosphatase: 94 IU/L (ref 44–121)
BUN/Creatinine Ratio: 15 (ref 12–28)
BUN: 14 mg/dL (ref 8–27)
Bilirubin Total: 0.3 mg/dL (ref 0.0–1.2)
CO2: 23 mmol/L (ref 20–29)
Calcium: 9 mg/dL (ref 8.7–10.3)
Chloride: 104 mmol/L (ref 96–106)
Creatinine, Ser: 0.91 mg/dL (ref 0.57–1.00)
Globulin, Total: 2.2 g/dL (ref 1.5–4.5)
Glucose: 97 mg/dL (ref 65–99)
Potassium: 4.1 mmol/L (ref 3.5–5.2)
Sodium: 142 mmol/L (ref 134–144)
Total Protein: 6.3 g/dL (ref 6.0–8.5)
eGFR: 68 mL/min/{1.73_m2} (ref >59)

## 2020-05-24 LAB — LIPID PANEL W/O CHOL/HDL RATIO
Cholesterol, Total: 185 mg/dL (ref 100–199)
HDL: 40 mg/dL (ref >39)
LDL Chol Calc (NIH): 119 mg/dL — ABNORMAL HIGH (ref 0–99)
Triglycerides: 143 mg/dL (ref 0–149)
VLDL Cholesterol Cal: 26 mg/dL (ref 5–40)

## 2020-05-24 LAB — TSH: TSH: 1.36 u[IU]/mL (ref 0.450–4.500)

## 2020-05-24 LAB — VITAMIN D 25 HYDROXY (VIT D DEFICIENCY, FRACTURES): Vit D, 25-Hydroxy: 17.5 ng/mL — ABNORMAL LOW (ref 30.0–100.0)

## 2020-05-26 ENCOUNTER — Encounter: Payer: Self-pay | Admitting: Family Medicine

## 2020-05-26 ENCOUNTER — Other Ambulatory Visit: Payer: Self-pay | Admitting: Family Medicine

## 2020-05-26 MED ORDER — VITAMIN D (ERGOCALCIFEROL) 1.25 MG (50000 UNIT) PO CAPS: 50000.0000 [IU] | ORAL_CAPSULE | ORAL | 1 refills | Status: DC

## 2020-07-28 DIAGNOSIS — G309 Alzheimer's disease, unspecified: Secondary | ICD-10-CM | POA: Diagnosis not present

## 2020-07-28 DIAGNOSIS — F0150 Vascular dementia without behavioral disturbance: Secondary | ICD-10-CM | POA: Diagnosis not present

## 2020-07-28 DIAGNOSIS — G4733 Obstructive sleep apnea (adult) (pediatric): Secondary | ICD-10-CM | POA: Diagnosis not present

## 2020-07-28 DIAGNOSIS — F0280 Dementia in other diseases classified elsewhere without behavioral disturbance: Secondary | ICD-10-CM | POA: Diagnosis not present

## 2020-07-28 DIAGNOSIS — R413 Other amnesia: Secondary | ICD-10-CM | POA: Diagnosis not present

## 2020-08-02 DIAGNOSIS — G309 Alzheimer's disease, unspecified: Secondary | ICD-10-CM | POA: Diagnosis not present

## 2020-08-02 DIAGNOSIS — R4182 Altered mental status, unspecified: Secondary | ICD-10-CM | POA: Diagnosis not present

## 2020-08-02 DIAGNOSIS — F0280 Dementia in other diseases classified elsewhere without behavioral disturbance: Secondary | ICD-10-CM | POA: Diagnosis not present

## 2020-08-02 DIAGNOSIS — F0150 Vascular dementia without behavioral disturbance: Secondary | ICD-10-CM | POA: Diagnosis not present

## 2020-08-11 ENCOUNTER — Telehealth: Payer: Self-pay | Admitting: *Deleted

## 2020-08-11 NOTE — Telephone Encounter (Signed)
Placed form in provider folder. Will patient need appointment?

## 2020-08-11 NOTE — Telephone Encounter (Signed)
Pt spouse brought in form for adult daycare to be filled out ASAP. Pt spouse wants a phone call when completed Placed in BIN for review

## 2020-08-16 DIAGNOSIS — F0150 Vascular dementia without behavioral disturbance: Secondary | ICD-10-CM | POA: Diagnosis not present

## 2020-08-16 DIAGNOSIS — Z8673 Personal history of transient ischemic attack (TIA), and cerebral infarction without residual deficits: Secondary | ICD-10-CM | POA: Diagnosis not present

## 2020-08-16 DIAGNOSIS — G309 Alzheimer's disease, unspecified: Secondary | ICD-10-CM | POA: Diagnosis not present

## 2020-08-16 DIAGNOSIS — F0280 Dementia in other diseases classified elsewhere without behavioral disturbance: Secondary | ICD-10-CM | POA: Diagnosis not present

## 2020-08-20 ENCOUNTER — Other Ambulatory Visit: Payer: Self-pay

## 2020-08-20 ENCOUNTER — Other Ambulatory Visit: Payer: Medicare PPO

## 2020-08-20 DIAGNOSIS — Z111 Encounter for screening for respiratory tuberculosis: Secondary | ICD-10-CM

## 2020-08-21 ENCOUNTER — Encounter: Payer: Self-pay | Admitting: Family Medicine

## 2020-08-21 ENCOUNTER — Ambulatory Visit: Payer: Medicare PPO | Admitting: Family Medicine

## 2020-08-21 DIAGNOSIS — G309 Alzheimer's disease, unspecified: Secondary | ICD-10-CM | POA: Diagnosis not present

## 2020-08-21 DIAGNOSIS — F0280 Dementia in other diseases classified elsewhere without behavioral disturbance: Secondary | ICD-10-CM

## 2020-08-21 NOTE — Progress Notes (Signed)
BP 123/81   Pulse 76   Temp 98.4 F (36.9 C) (Oral)   Ht 5' 1.5" (1.562 m)   Wt 170 lb 12.8 oz (77.5 kg)   SpO2 92%   BMI 31.75 kg/m    Subjective:    Patient ID: Amariah L Bosserman, female    DOB: 1950/12/07, 70 y.o.   MRN: [id number]  HPI: Valeree Leidy Lobban is a 70 y.o. female  Chief Complaint  Patient presents with   Dementia    Charlize is going to be going to an adult day program. She is feeling well and has no concerns. She needs a form filled out. See scanned document.    Relevant past medical, surgical, family and social history reviewed and updated as indicated. Interim medical history since our last visit reviewed. Allergies and medications reviewed and updated.  Review of Systems  Constitutional: Negative.   Respiratory: Negative.    Cardiovascular: Negative.   Gastrointestinal: Negative.   Psychiatric/Behavioral:  Positive for confusion. Negative for agitation, behavioral problems, decreased concentration, dysphoric mood, hallucinations, self-injury, sleep disturbance and suicidal ideas. The patient is not nervous/anxious and is not hyperactive.    Per HPI unless specifically indicated above     Objective:    BP 123/81   Pulse 76   Temp 98.4 F (36.9 C) (Oral)   Ht 5' 1.5" (1.562 m)   Wt 170 lb 12.8 oz (77.5 kg)   SpO2 92%   BMI 31.75 kg/m   Wt Readings from Last 3 Encounters:  08/21/20 170 lb 12.8 oz (77.5 kg)  05/23/20 175 lb 3.2 oz (79.5 kg)  05/02/20 173 lb (78.5 kg)    Physical Exam Vitals and nursing note reviewed.  Constitutional:      General: She is not in acute distress.    Appearance: Normal appearance. She is not ill-appearing, toxic-appearing or diaphoretic.  HENT:     Head: Normocephalic and atraumatic.     Right Ear: External ear normal.     Left Ear: External ear normal.     Nose: Nose normal.     Mouth/Throat:     Mouth: Mucous membranes are moist.     Pharynx: Oropharynx is clear.  Eyes:     General: No scleral icterus.        Right eye: No discharge.        Left eye: No discharge.     Extraocular Movements: Extraocular movements intact.     Conjunctiva/sclera: Conjunctivae normal.     Pupils: Pupils are equal, round, and reactive to light.  Cardiovascular:     Rate and Rhythm: Normal rate and regular rhythm.     Pulses: Normal pulses.     Heart sounds: Normal heart sounds. No murmur heard.   No friction rub. No gallop.  Pulmonary:     Effort: Pulmonary effort is normal. No respiratory distress.     Breath sounds: Normal breath sounds. No stridor. No wheezing, rhonchi or rales.  Chest:     Chest wall: No tenderness.  Musculoskeletal:        General: Normal range of motion.     Cervical back: Normal range of motion and neck supple.  Skin:    General: Skin is warm and dry.     Capillary Refill: Capillary refill takes less than 2 seconds.     Coloration: Skin is not jaundiced or pale.     Findings: No bruising, erythema, lesion or rash.  Neurological:     General: No focal  deficit present.     Mental Status: She is alert and oriented to person, place, and time. Mental status is at baseline.  Psychiatric:        Mood and Affect: Mood normal.        Behavior: Behavior normal.        Thought Content: Thought content normal.        Judgment: Judgment normal.    Results for orders placed or performed in visit on 05/23/20  CBC with Differential/Platelet  Result Value Ref Range   WBC 5.1 3.4 - 10.8 x10E3/uL   RBC 4.84 3.77 - 5.28 x10E6/uL   Hemoglobin 12.8 11.1 - 15.9 g/dL   Hematocrit 40.4 34.0 - 46.6 %   MCV 84 79 - 97 fL   MCH 26.4 (L) 26.6 - 33.0 pg   MCHC 31.7 31.5 - 35.7 g/dL   RDW 13.2 11.7 - 15.4 %   Platelets 228 150 - 450 x10E3/uL   Neutrophils 66 Not Estab. %   Lymphs 22 Not Estab. %   Monocytes 8 Not Estab. %   Eos 3 Not Estab. %   Basos 1 Not Estab. %   Neutrophils Absolute 3.4 1.4 - 7.0 x10E3/uL   Lymphocytes Absolute 1.1 0.7 - 3.1 x10E3/uL   Monocytes Absolute 0.4 0.1 - 0.9 x10E3/uL    EOS (ABSOLUTE) 0.2 0.0 - 0.4 x10E3/uL   Basophils Absolute 0.1 0.0 - 0.2 x10E3/uL   Immature Granulocytes 0 Not Estab. %   Immature Grans (Abs) 0.0 0.0 - 0.1 x10E3/uL  Comprehensive metabolic panel  Result Value Ref Range   Glucose 97 65 - 99 mg/dL   BUN 14 8 - 27 mg/dL   Creatinine, Ser 0.91 0.57 - 1.00 mg/dL   eGFR 68 >59 mL/min/1.73   BUN/Creatinine Ratio 15 12 - 28   Sodium 142 134 - 144 mmol/L   Potassium 4.1 3.5 - 5.2 mmol/L   Chloride 104 96 - 106 mmol/L   CO2 23 20 - 29 mmol/L   Calcium 9.0 8.7 - 10.3 mg/dL   Total Protein 6.3 6.0 - 8.5 g/dL   Albumin 4.1 3.8 - 4.8 g/dL   Globulin, Total 2.2 1.5 - 4.5 g/dL   Albumin/Globulin Ratio 1.9 1.2 - 2.2   Bilirubin Total 0.3 0.0 - 1.2 mg/dL   Alkaline Phosphatase 94 44 - 121 IU/L   AST 17 0 - 40 IU/L   ALT 16 0 - 32 IU/L  Lipid Panel w/o Chol/HDL Ratio  Result Value Ref Range   Cholesterol, Total 185 100 - 199 mg/dL   Triglycerides 143 0 - 149 mg/dL   HDL 40 >39 mg/dL   VLDL Cholesterol Cal 26 5 - 40 mg/dL   LDL Chol Calc (NIH) 119 (H) 0 - 99 mg/dL  Urinalysis, Routine w reflex microscopic  Result Value Ref Range   Specific Gravity, UA <1.005 (L) 1.005 - 1.030   pH, UA 6.0 5.0 - 7.5   Color, UA Yellow Yellow   Appearance Ur Clear Clear   Leukocytes,UA Negative Negative   Protein,UA Negative Negative/Trace   Glucose, UA Negative Negative   Ketones, UA Negative Negative   RBC, UA Negative Negative   Bilirubin, UA Negative Negative   Urobilinogen, Ur 0.2 0.2 - 1.0 mg/dL   Nitrite, UA Negative Negative  TSH  Result Value Ref Range   TSH 1.360 0.450 - 4.500 uIU/mL  Bayer DCA Hb A1c Waived  Result Value Ref Range   HB A1C (BAYER DCA - WAIVED) 5.3 <  7.0 %  Microalbumin, Urine Waived  Result Value Ref Range   Microalb, Ur Waived 10 0 - 19 mg/L   Creatinine, Urine Waived 50 10 - 300 mg/dL   Microalb/Creat Ratio <30 <30 mg/g  VITAMIN D 25 Hydroxy (Vit-D Deficiency, Fractures)  Result Value Ref Range   Vit D,  25-Hydroxy 17.5 (L) 30.0 - 100.0 ng/mL      Assessment & Plan:   Problem List Items Addressed This Visit       Nervous and Auditory   Dementia ([place])    Stable. To be going to adult day program. Form filled out. See scanned document.        Relevant Medications   donepezil (ARICEPT) 5 MG tablet     Follow up plan: Return if symptoms worsen or fail to improve.

## 2020-08-21 NOTE — Assessment & Plan Note (Signed)
Stable. To be going to adult day program. Form filled out. See scanned document.

## 2020-08-27 LAB — QUANTIFERON-TB GOLD PLUS
QuantiFERON Mitogen Value: >10 IU/mL
QuantiFERON Nil Value: 0.03 IU/mL
QuantiFERON TB1 Ag Value: 0.02 IU/mL
QuantiFERON TB2 Ag Value: 0.03 IU/mL
QuantiFERON-TB Gold Plus: NEGATIVE

## 2020-09-23 DIAGNOSIS — L299 Pruritus, unspecified: Secondary | ICD-10-CM | POA: Diagnosis not present

## 2020-09-23 DIAGNOSIS — K08109 Complete loss of teeth, unspecified cause, unspecified class: Secondary | ICD-10-CM | POA: Diagnosis not present

## 2020-09-23 DIAGNOSIS — G309 Alzheimer's disease, unspecified: Secondary | ICD-10-CM | POA: Diagnosis not present

## 2020-09-23 DIAGNOSIS — F325 Major depressive disorder, single episode, in full remission: Secondary | ICD-10-CM | POA: Diagnosis not present

## 2020-09-23 DIAGNOSIS — L309 Dermatitis, unspecified: Secondary | ICD-10-CM | POA: Diagnosis not present

## 2020-09-23 DIAGNOSIS — R32 Unspecified urinary incontinence: Secondary | ICD-10-CM | POA: Diagnosis not present

## 2020-09-23 DIAGNOSIS — F419 Anxiety disorder, unspecified: Secondary | ICD-10-CM | POA: Diagnosis not present

## 2020-09-23 DIAGNOSIS — I10 Essential (primary) hypertension: Secondary | ICD-10-CM | POA: Diagnosis not present

## 2020-09-23 DIAGNOSIS — E669 Obesity, unspecified: Secondary | ICD-10-CM | POA: Diagnosis not present

## 2020-10-01 ENCOUNTER — Other Ambulatory Visit: Payer: Self-pay | Admitting: Family Medicine

## 2020-10-01 DIAGNOSIS — Z1231 Encounter for screening mammogram for malignant neoplasm of breast: Secondary | ICD-10-CM

## 2020-10-03 ENCOUNTER — Encounter: Payer: Self-pay | Admitting: Nurse Practitioner

## 2020-10-03 ENCOUNTER — Ambulatory Visit: Payer: Medicare PPO | Admitting: Nurse Practitioner

## 2020-10-03 ENCOUNTER — Other Ambulatory Visit: Payer: Self-pay

## 2020-10-03 VITALS — BP 122/82 | HR 80 | Temp 98.3°F | Wt 171.5 lb

## 2020-10-03 DIAGNOSIS — L304 Erythema intertrigo: Secondary | ICD-10-CM

## 2020-10-03 MED ORDER — FLUCONAZOLE 150 MG PO TABS: 150.0000 mg | ORAL_TABLET | Freq: Every day | ORAL | 0 refills | Status: DC

## 2020-10-03 MED ORDER — NYSTATIN 100000 UNIT/GM EX POWD: 1.0000 "application " | Freq: Three times a day (TID) | CUTANEOUS | 0 refills | Status: DC

## 2020-10-03 NOTE — Assessment & Plan Note (Signed)
Ongoing x1 week. Will start oral diflucan for 2 weeks. Continue washing area with soap and water daily, pat dry. Alternate nystatin powder and clotrimazole-betamethasone cream every other day. Try to keep area dry. Call for worsening symptoms, signs of infection.

## 2020-10-03 NOTE — Progress Notes (Signed)
Acute Office Visit  Subjective:    Patient ID: [name] [name], female    DOB: [birth year], 70 y.o.   MRN: [id number]  Chief Complaint  Patient presents with   Rash    Under breast, groin area and under stomach, spot under stomach is where she had a c-section and it looks like an ulcer now Has tried clotrimazole Betamethasone cream and it has not helped    HPI Patient is in today for rash under her breasts and abdominal skin fold. The rash has gotten worse over the past week.   RASH Duration:  weeks  Location: groin, under breast Itching: no Burning: no Redness: yes Oozing: yes Scaling: no Blisters: no Painful: yes Fevers: no Change in detergents/soaps/personal care products: no Recent illness: no Recent travel:no History of same: yes Context: worse Alleviating factors: nothing Treatments attempted: peroxide, soap and water, clotrimazole & betamethasone  Shortness of breath: no  Throat/tongue swelling: no Myalgias/arthralgias: no   Past Medical History:  Diagnosis Date   Hypertension    Migraines     Past Surgical History:  Procedure Laterality Date   ABDOMINAL HYSTERECTOMY     CARPAL TUNNEL RELEASE     CESAREAN SECTION     CHOLECYSTECTOMY     HAMMER TOE SURGERY Bilateral     Family History  Problem Relation Age of Onset   Heart disease Mother    Heart attack Father    Hypertension Father    Cancer Paternal Aunt    Breast cancer Paternal Aunt     Social History   Socioeconomic History   Marital status: Married    Spouse name: Not on file   Number of children: Not on file   Years of education: Not on file   Highest education level: Not on file  Occupational History   Not on file  Tobacco Use   Smoking status: Never   Smokeless tobacco: Never  Vaping Use   Vaping Use: Never used  Substance and Sexual Activity   Alcohol use: No    Alcohol/week: 0.0 standard drinks   Drug use: No   Sexual activity: Not Currently  Other Topics Concern    Not on file  Social History Narrative   No exercise   Social Determinants of Health   Financial Resource Strain: Low Risk    Difficulty of Paying Living Expenses: Not hard at all  Food Insecurity: No Food Insecurity   Worried About Charity fundraiser in the Last Year: Never true   Arboriculturist in the Last Year: Never true  Transportation Needs: No Transportation Needs   Lack of Transportation (Medical): No   Lack of Transportation (Non-Medical): No  Physical Activity: Inactive   Days of Exercise per Week: 0 days   Minutes of Exercise per Session: 0 min  Stress: No Stress Concern Present   Feeling of Stress : Not at all  Social Connections: Not on file  Intimate Partner Violence: Not on file    Outpatient Medications Prior to Visit  Medication Sig Dispense Refill   ARIPiprazole (ABILIFY) 5 MG tablet Take 1 tablet (5 mg total) by mouth daily. 90 tablet 1   buPROPion (WELLBUTRIN XL) 300 MG 24 hr tablet Take 1 tablet (300 mg total) by mouth daily. 90 tablet 1   clotrimazole-betamethasone (LOTRISONE) cream Apply 1 application topically 2 (two) times daily. 30 g 3   donepezil (ARICEPT) 5 MG tablet Take 5 mg by mouth at bedtime.  hydrochlorothiazide (HYDRODIURIL) 25 MG tablet Take 1 tablet (25 mg total) by mouth daily. 90 tablet 1   hydrOXYzine (ATARAX/VISTARIL) 25 MG tablet Take 1 tablet (25 mg total) by mouth 3 (three) times daily as needed. 90 tablet 3   naproxen (NAPROSYN) 500 MG tablet Take 1 tablet (500 mg total) by mouth 2 (two) times daily with a meal. 180 tablet 1   venlafaxine XR (EFFEXOR-XR) 150 MG 24 hr capsule Take 1 capsule (150 mg total) by mouth at bedtime. 90 capsule 1   Vitamin D, Ergocalciferol, (DRISDOL) 1.25 MG (50000 UNIT) CAPS capsule Take 1 capsule (50,000 Units total) by mouth every 7 (seven) days. 12 capsule 1   No facility-administered medications prior to visit.    Allergies  Allergen Reactions   Codeine Nausea And Vomiting and Nausea Only    Tussionex Pennkinetic Er [Hydrocod Polst-Cpm Polst Er] Nausea And Vomiting    Review of Systems  Constitutional:  Positive for fatigue.  Respiratory: Negative.    Cardiovascular: Negative.   Gastrointestinal: Negative.   Genitourinary: Negative.   Skin:  Positive for rash.      Objective:    Physical Exam Vitals and nursing note reviewed.  Constitutional:      General: She is not in acute distress.    Appearance: Normal appearance.  HENT:     Head: Normocephalic.  Eyes:     Conjunctiva/sclera: Conjunctivae normal.  Cardiovascular:     Rate and Rhythm: Normal rate.     Pulses: Normal pulses.  Pulmonary:     Effort: Pulmonary effort is normal.  Musculoskeletal:     Cervical back: Normal range of motion.  Skin:    General: Skin is warm.     Findings: Rash present.     Comments: Redness under left and right breast. Redness with moisture under abdominal skin fold. Small area 1cm x1cm of skin breakdown with redness. No signs of infection.   Neurological:     Mental Status: She is alert.  Psychiatric:        Behavior: Behavior normal.    BP 122/82   Pulse 80   Temp 98.3 F (36.8 C)   Wt 171 lb 8 oz (77.8 kg)   SpO2 96%   BMI 31.88 kg/m  Wt Readings from Last 3 Encounters:  10/03/20 171 lb 8 oz (77.8 kg)  08/21/20 170 lb 12.8 oz (77.5 kg)  05/23/20 175 lb 3.2 oz (79.5 kg)    Health Maintenance Due  Topic Date Due   MAMMOGRAM  04/10/2018   COVID-19 Vaccine (3 - Booster for Moderna series) 03/13/2020   INFLUENZA VACCINE  09/01/2020    There are no preventive care reminders to display for this patient.   Lab Results  Component Value Date   TSH 1.360 05/23/2020   Lab Results  Component Value Date   WBC 5.1 05/23/2020   HGB 12.8 05/23/2020   HCT 40.4 05/23/2020   MCV 84 05/23/2020   PLT 228 05/23/2020   Lab Results  Component Value Date   NA 142 05/23/2020   K 4.1 05/23/2020   CO2 23 05/23/2020   GLUCOSE 97 05/23/2020   BUN 14 05/23/2020    CREATININE 0.91 05/23/2020   BILITOT 0.3 05/23/2020   ALKPHOS 94 05/23/2020   AST 17 05/23/2020   ALT 16 05/23/2020   PROT 6.3 05/23/2020   ALBUMIN 4.1 05/23/2020   CALCIUM 9.0 05/23/2020   ANIONGAP 14 07/14/2017   EGFR 68 05/23/2020   Lab Results  Component  Value Date   CHOL 185 05/23/2020   Lab Results  Component Value Date   HDL 40 05/23/2020   Lab Results  Component Value Date   LDLCALC 119 (H) 05/23/2020   Lab Results  Component Value Date   TRIG 143 05/23/2020   No results found for: [facility] Lab Results  Component Value Date   HGBA1C 5.3 05/23/2020       Assessment & Plan:   Problem List Items Addressed This Visit       Musculoskeletal and Integument   Intertrigo - Primary    Ongoing x1 week. Will start oral diflucan for 2 weeks. Continue washing area with soap and water daily, pat dry. Alternate nystatin powder and clotrimazole-betamethasone cream every other day. Try to keep area dry. Call for worsening symptoms, signs of infection.         Meds ordered this encounter  Medications   fluconazole (DIFLUCAN) 150 MG tablet    Sig: Take 1 tablet (150 mg total) by mouth daily.    Dispense:  14 tablet    Refill:  0   nystatin (MYCOSTATIN/NYSTOP) powder    Sig: Apply 1 application topically 3 (three) times daily.    Dispense:  15 g    Refill:  0      Charyl Dancer, NP

## 2020-10-16 ENCOUNTER — Ambulatory Visit
Admission: RE | Admit: 2020-10-16 | Discharge: 2020-10-16 | Disposition: A | Discharge status: Home / Self Care | Payer: Medicare PPO | Source: Ambulatory Visit | Attending: Family Medicine | Admitting: Family Medicine

## 2020-10-16 ENCOUNTER — Other Ambulatory Visit: Payer: Self-pay

## 2020-10-16 DIAGNOSIS — Z1231 Encounter for screening mammogram for malignant neoplasm of breast: Secondary | ICD-10-CM | POA: Diagnosis not present

## 2020-10-21 ENCOUNTER — Encounter: Payer: Self-pay | Admitting: Family Medicine

## 2020-10-23 ENCOUNTER — Telehealth: Payer: Self-pay

## 2020-10-23 NOTE — Telephone Encounter (Signed)
Copied from CRM [id number]. Topic: General - Call Back - No Documentation >> Oct 23, 2020  3:00 PM Randol Kern wrote: Reason for CRM: Pt's husband called to report that patient is not improving, wants to speak to office. He believes she needs to begin an antibiotic cycle, per discussion from last visit.   Best contact: [phone]   Routing to provider who saw the patient to advise.

## 2020-10-24 MED ORDER — CEPHALEXIN 500 MG PO CAPS: 500.0000 mg | ORAL_CAPSULE | Freq: Four times a day (QID) | ORAL | 0 refills | Status: DC

## 2020-10-24 NOTE — Telephone Encounter (Signed)
Pt is scheduled for 9/29 at 8:40 with Leotis Shames

## 2020-10-29 NOTE — Progress Notes (Signed)
Established Patient Office Visit  Subjective:  Patient ID: [name] [name], female    DOB: 05-Jan-1951  Age: 70 y.o. MRN: [id number]  CC:  Chief Complaint  Patient presents with   Rash    Pt's husband states that the rash has gotten better but the ulcer is not better. States they have taken 5 days of the antibiotic and have used all of the nystatin powder    HPI [name] presents for follow-up on rash under breast and groin. She was treat with diflucan x2 weeks with minimal improvement and then keflex x10 days. She has completed 5 days of keflex thus far. Rash has gotten better, but there is still open area in skin midline on abdominal fold. Denies fevers, drainage, increasing redness.   Past Medical History:  Diagnosis Date   Hypertension    Migraines     Past Surgical History:  Procedure Laterality Date   CARPAL TUNNEL RELEASE     CESAREAN SECTION     CHOLECYSTECTOMY     HAMMER TOE SURGERY Bilateral    TOTAL ABDOMINAL HYSTERECTOMY W/ BILATERAL SALPINGOOPHORECTOMY      Family History  Problem Relation Age of Onset   Heart disease Mother    Heart attack Father    Hypertension Father    Cancer Paternal Aunt    Breast cancer Paternal Aunt     Social History   Socioeconomic History   Marital status: Married    Spouse name: Not on file   Number of children: Not on file   Years of education: Not on file   Highest education level: Not on file  Occupational History   Not on file  Tobacco Use   Smoking status: Never   Smokeless tobacco: Never  Vaping Use   Vaping Use: Never used  Substance and Sexual Activity   Alcohol use: No    Alcohol/week: 0.0 standard drinks   Drug use: No   Sexual activity: Not Currently  Other Topics Concern   Not on file  Social History Narrative   No exercise   Social Determinants of Health   Financial Resource Strain: Low Risk    Difficulty of Paying Living Expenses: Not hard at all  Food Insecurity: No Food Insecurity    Worried About Charity fundraiser in the Last Year: Never true   [place] in the Last Year: Never true  Transportation Needs: No Transportation Needs   Lack of Transportation (Medical): No   Lack of Transportation (Non-Medical): No  Physical Activity: Inactive   Days of Exercise per Week: 0 days   Minutes of Exercise per Session: 0 min  Stress: No Stress Concern Present   Feeling of Stress : Not at all  Social Connections: Not on file  Intimate Partner Violence: Not on file    Outpatient Medications Prior to Visit  Medication Sig Dispense Refill   ARIPiprazole (ABILIFY) 5 MG tablet Take 1 tablet (5 mg total) by mouth daily. 90 tablet 1   buPROPion (WELLBUTRIN XL) 300 MG 24 hr tablet Take 1 tablet (300 mg total) by mouth daily. 90 tablet 1   cephALEXin (KEFLEX) 500 MG capsule Take 1 capsule (500 mg total) by mouth 4 (four) times daily. 40 capsule 0   donepezil (ARICEPT) 5 MG tablet Take 5 mg by mouth at bedtime.     fluconazole (DIFLUCAN) 150 MG tablet Take 1 tablet (150 mg total) by mouth daily. 14 tablet 0   hydrochlorothiazide (HYDRODIURIL) 25  MG tablet Take 1 tablet (25 mg total) by mouth daily. 90 tablet 1   hydrOXYzine (ATARAX/VISTARIL) 25 MG tablet Take 1 tablet (25 mg total) by mouth 3 (three) times daily as needed. 90 tablet 3   naproxen (NAPROSYN) 500 MG tablet Take 1 tablet (500 mg total) by mouth 2 (two) times daily with a meal. 180 tablet 1   venlafaxine XR (EFFEXOR-XR) 150 MG 24 hr capsule Take 1 capsule (150 mg total) by mouth at bedtime. 90 capsule 1   Vitamin D, Ergocalciferol, (DRISDOL) 1.25 MG (50000 UNIT) CAPS capsule Take 1 capsule (50,000 Units total) by mouth every 7 (seven) days. 12 capsule 1   clotrimazole-betamethasone (LOTRISONE) cream Apply 1 application topically 2 (two) times daily. 30 g 3   nystatin (MYCOSTATIN/NYSTOP) powder Apply 1 application topically 3 (three) times daily. 15 g 0   No facility-administered medications prior to visit.     Allergies  Allergen Reactions   Codeine Nausea And Vomiting and Nausea Only   Tussionex Pennkinetic Er [Hydrocod Polst-Cpm Polst Er] Nausea And Vomiting    ROS Review of Systems  Constitutional: Negative.   Respiratory: Negative.    Cardiovascular: Negative.   Skin:  Positive for wound.     Objective:    Physical Exam Vitals and nursing note reviewed.  Constitutional:      General: She is not in acute distress.    Appearance: Normal appearance.  HENT:     Head: Normocephalic and atraumatic.  Eyes:     Conjunctiva/sclera: Conjunctivae normal.  Cardiovascular:     Rate and Rhythm: Normal rate and regular rhythm.     Pulses: Normal pulses.     Heart sounds: Normal heart sounds.  Pulmonary:     Effort: Pulmonary effort is normal.     Breath sounds: Normal breath sounds.  Abdominal:     Palpations: Abdomen is soft.     Tenderness: There is abdominal tenderness.  Musculoskeletal:     Cervical back: Normal range of motion.  Skin:    General: Skin is warm and dry.     Comments: 1.5cm x1.5cm breakdown of skin with minor erythema, no drainage, no signs of infection  Neurological:     General: No focal deficit present.     Mental Status: She is alert. Mental status is at baseline.  Psychiatric:        Mood and Affect: Mood normal.        Behavior: Behavior normal.        Thought Content: Thought content normal.        Judgment: Judgment normal.    BP 107/73   Pulse (!) 105   Temp 97.6 F (36.4 C) (Oral)   Ht 5' 1.5" (1.562 m)   Wt 169 lb 9.6 oz (76.9 kg)   SpO2 96%   BMI 31.53 kg/m  Wt Readings from Last 3 Encounters:  10/30/20 169 lb 9.6 oz (76.9 kg)  10/03/20 171 lb 8 oz (77.8 kg)  08/21/20 170 lb 12.8 oz (77.5 kg)     There are no preventive care reminders to display for this patient.   There are no preventive care reminders to display for this patient.  Lab Results  Component Value Date   TSH 1.360 05/23/2020   Lab Results  Component Value  Date   WBC 5.1 05/23/2020   HGB 12.8 05/23/2020   HCT 40.4 05/23/2020   MCV 84 05/23/2020   PLT 228 05/23/2020   Lab Results  Component Value Date  NA 142 05/23/2020   K 4.1 05/23/2020   CO2 23 05/23/2020   GLUCOSE 97 05/23/2020   BUN 14 05/23/2020   CREATININE 0.91 05/23/2020   BILITOT 0.3 05/23/2020   ALKPHOS 94 05/23/2020   AST 17 05/23/2020   ALT 16 05/23/2020   PROT 6.3 05/23/2020   ALBUMIN 4.1 05/23/2020   CALCIUM 9.0 05/23/2020   ANIONGAP 14 07/14/2017   EGFR 68 05/23/2020   Lab Results  Component Value Date   CHOL 185 05/23/2020   Lab Results  Component Value Date   HDL 40 05/23/2020   Lab Results  Component Value Date   LDLCALC 119 (H) 05/23/2020   Lab Results  Component Value Date   TRIG 143 05/23/2020   No results found for: [facility] Lab Results  Component Value Date   HGBA1C 5.3 05/23/2020      Assessment & Plan:   Problem List Items Addressed This Visit       Musculoskeletal and Integument   Intertrigo - Primary    Improving. Mostly resolved. will send refill on nystatin powder and lotrisone cream.       Other Visit Diagnoses     Visit for wound check       Ongoing, mild erythema. No sign of infection. Treat with mupiricin topical BID and bactrim x7 days. If no improvement in next 5 days, will refer to wound clinic       Meds ordered this encounter  Medications   sulfamethoxazole-trimethoprim (BACTRIM DS) 800-160 MG tablet    Sig: Take 1 tablet by mouth 2 (two) times daily.    Dispense:  14 tablet    Refill:  0   mupirocin cream (BACTROBAN) 2 %    Sig: Apply 1 application topically 2 (two) times daily.    Dispense:  15 g    Refill:  0   clotrimazole-betamethasone (LOTRISONE) cream    Sig: Apply 1 application topically 2 (two) times daily.    Dispense:  30 g    Refill:  3   nystatin (MYCOSTATIN/NYSTOP) powder    Sig: Apply 1 application topically 3 (three) times daily.    Dispense:  15 g    Refill:  0     Follow-up:  Return if symptoms worsen or fail to improve.    Charyl Dancer, NP

## 2020-10-30 ENCOUNTER — Ambulatory Visit: Payer: Medicare PPO | Admitting: Nurse Practitioner

## 2020-10-30 ENCOUNTER — Encounter: Payer: Self-pay | Admitting: Nurse Practitioner

## 2020-10-30 ENCOUNTER — Other Ambulatory Visit: Payer: Self-pay

## 2020-10-30 VITALS — BP 107/73 | HR 105 | Temp 97.6°F | Ht 61.5 in | Wt 169.6 lb

## 2020-10-30 DIAGNOSIS — Z5189 Encounter for other specified aftercare: Secondary | ICD-10-CM | POA: Diagnosis not present

## 2020-10-30 DIAGNOSIS — L304 Erythema intertrigo: Secondary | ICD-10-CM | POA: Diagnosis not present

## 2020-10-30 MED ORDER — MUPIROCIN CALCIUM 2 % EX CREA: 1.0000 "application " | TOPICAL_CREAM | Freq: Two times a day (BID) | CUTANEOUS | 0 refills | Status: DC

## 2020-10-30 MED ORDER — NYSTATIN 100000 UNIT/GM EX POWD: 1.0000 "application " | Freq: Three times a day (TID) | CUTANEOUS | 0 refills | Status: DC

## 2020-10-30 MED ORDER — SULFAMETHOXAZOLE-TRIMETHOPRIM 800-160 MG PO TABS: 1.0000 | ORAL_TABLET | Freq: Two times a day (BID) | ORAL | 0 refills | Status: DC

## 2020-10-30 MED ORDER — CLOTRIMAZOLE-BETAMETHASONE 1-0.05 % EX CREA: 1.0000 "application " | TOPICAL_CREAM | Freq: Two times a day (BID) | CUTANEOUS | 3 refills | Status: DC

## 2020-10-30 NOTE — Assessment & Plan Note (Signed)
Improving. Mostly resolved. will send refill on nystatin powder and lotrisone cream.

## 2020-10-30 NOTE — Patient Instructions (Signed)
If after 5 days, not seeing any improvement, call the office and we will place an order to wound clinic  Stop keflex, start bactrim twice a day for 7 days  Continue antifungal cream and powder after washing and drying skin folds  Wash and dry wound with soap and water. Then apply bactroban ointment twice a day.

## 2020-11-10 ENCOUNTER — Other Ambulatory Visit: Payer: Self-pay | Admitting: Family Medicine

## 2020-11-10 NOTE — Telephone Encounter (Signed)
Patient was last seen on 10/30/20 Patient has up coming appt on 11/21/20

## 2020-11-10 NOTE — Telephone Encounter (Signed)
Requested medications are due for refill today.  yes  Requested medications are on the active medications list.  yes  Last refill. 05/26/2020  Future visit scheduled.   yes  Notes to clinic.  Medication not delegated.

## 2020-11-13 ENCOUNTER — Other Ambulatory Visit: Payer: Self-pay | Admitting: Family Medicine

## 2020-11-14 NOTE — Telephone Encounter (Signed)
Does have appt soon but note states needs labs to determine if med needed.This medication can not be delegated, please assess.   Requested Prescriptions  Pending Prescriptions Disp Refills   Vitamin D, Ergocalciferol, (DRISDOL) 1.25 MG (50000 UNIT) CAPS capsule [Pharmacy Med Name: VITAMIN D2 1.25MG (50,000 UNIT)] 12 capsule 0    Sig: Take 1 capsule (50,000 Units total) by mouth every 7 (seven) days.     Endocrinology:  Vitamins - Vitamin D Supplementation Failed - 11/13/2020 10:55 AM      Failed - 50,000 IU strengths are not delegated      Failed - Phosphate in normal range and within 360 days    No results found for: PHOS        Failed - Vitamin D in normal range and within 360 days    Vit D, 25-Hydroxy  Date Value Ref Range Status  05/23/2020 17.5 (L) 30.0 - 100.0 ng/mL Final    Comment:    Vitamin D deficiency has been defined by the Institute of Medicine and an Endocrine Society practice guideline as a level of serum 25-OH vitamin D less than 20 ng/mL (1,2). The Endocrine Society went on to further define vitamin D insufficiency as a level between 21 and 29 ng/mL (2). 1. IOM (Institute of Medicine). 2010. Dietary reference    intakes for calcium and D. [place]: The    [employer]. 2. Holick MF, Binkley [place], Bischoff-Ferrari HA, et al.    Evaluation, treatment, and prevention of vitamin D    deficiency: an Endocrine Society clinical practice    guideline. JCEM. 2011 Jul; 96(7):1911-30.           Passed - Ca in normal range and within 360 days    Calcium  Date Value Ref Range Status  05/23/2020 9.0 8.7 - 10.3 mg/dL Final          Passed - Valid encounter within last 12 months    Recent Outpatient Visits           2 weeks ago [facility]   [facility] McElwee, Lauren A, NP   1 month ago [facility]   [facility], Lauren A, NP   2 months ago Alzheimer's dementia without behavioral disturbance, unspecified timing of  dementia onset (HCC)   [facility] Johnson, [name], DO   5 months ago Routine general medical examination at a health care facility   [facility], [name], DO   9 months ago Suspected COVID-19 virus infection   [facility] Honalo, [place], DO       Future Appointments             In 1 week Laural Benes, Oralia Rud, DO [facility]   In 5 months  [facility]

## 2020-11-14 NOTE — Telephone Encounter (Signed)
Dr. Laural Benes will recheck labs at next visit and will refill if appropriate at that time.

## 2020-11-17 ENCOUNTER — Other Ambulatory Visit: Payer: Self-pay | Admitting: Family Medicine

## 2020-11-17 NOTE — Telephone Encounter (Signed)
Requested Prescriptions  Pending Prescriptions Disp Refills  . naproxen (NAPROSYN) 500 MG tablet [Pharmacy Med Name: NAPROXEN 500 MG TABLET] 180 tablet 0    Sig: Take 1 tablet (500 mg total) by mouth 2 (two) times daily with a meal.     Analgesics:  NSAIDS Passed - 11/17/2020 12:51 PM      Passed - Cr in normal range and within 360 days    Creatinine, Ser  Date Value Ref Range Status  05/23/2020 0.91 0.57 - 1.00 mg/dL Final         Passed - HGB in normal range and within 360 days    Hemoglobin  Date Value Ref Range Status  05/23/2020 12.8 11.1 - 15.9 g/dL Final         Passed - Patient is not pregnant      Passed - Valid encounter within last 12 months    Recent Outpatient Visits          2 weeks ago [facility]   [facility], Lauren A, NP   1 month ago [facility]   [facility], Lauren A, NP   2 months ago Alzheimer's dementia without behavioral disturbance, unspecified timing of dementia onset (HCC)   [facility] Johnson, [name], DO   5 months ago Routine general medical examination at a health care facility   [facility], [name], DO   9 months ago Suspected COVID-19 virus infection   [facility] [place], [place], DO      Future Appointments            In 4 days Laural Benes, Oralia Rud, DO [facility]   In 5 months  [facility]           . venlafaxine XR (EFFEXOR-XR) 150 MG 24 hr capsule [Pharmacy Med Name: VENLAFAXINE HCL ER 150 MG CAP] 90 capsule 0    Sig: Take 1 capsule (150 mg total) by mouth at bedtime.     Psychiatry: Antidepressants - SNRI - desvenlafaxine & venlafaxine Failed - 11/17/2020 12:51 PM      Failed - LDL in normal range and within 360 days    LDL Chol Calc (NIH)  Date Value Ref Range Status  05/23/2020 119 (H) 0 - 99 mg/dL Final         Passed - Total Cholesterol in normal range and within 360 days    Cholesterol, Total   Date Value Ref Range Status  05/23/2020 185 100 - 199 mg/dL Final   Cholesterol Piccolo, Waived  Date Value Ref Range Status  01/07/2016 203 (H) <200 mg/dL Final    Comment:                            Desirable                <200                         Borderline High      200- 239                         High                     >239          Passed - Triglycerides in normal range and within  360 days    Triglycerides  Date Value Ref Range Status  05/23/2020 143 0 - 149 mg/dL Final   Triglycerides Piccolo,Waived  Date Value Ref Range Status  01/07/2016 190 (H) <150 mg/dL Final    Comment:                            Normal                   <150                         Borderline High     150 - 199                         High                200 - 499                         Very High                >499          Passed - Completed PHQ-2 or PHQ-9 in the last 360 days      Passed - Last BP in normal range    BP Readings from Last 1 Encounters:  10/30/20 107/73         Passed - Valid encounter within last 6 months    Recent Outpatient Visits          2 weeks ago [facility]   [facility], Lauren A, NP   1 month ago [facility]   [facility], Lauren A, NP   2 months ago Alzheimer's dementia without behavioral disturbance, unspecified timing of dementia onset (HCC)   [facility] Johnson, [name], DO   5 months ago Routine general medical examination at a health care facility   [facility], [name], DO   9 months ago Suspected COVID-19 virus infection   [facility] Sinclair, [place], DO      Future Appointments            In 4 days Laural Benes, Oralia Rud, DO [facility]   In 5 months  [facility]

## 2020-11-21 ENCOUNTER — Telehealth: Payer: Self-pay | Admitting: Family Medicine

## 2020-11-21 ENCOUNTER — Ambulatory Visit: Payer: Medicare PPO | Admitting: Family Medicine

## 2020-11-21 ENCOUNTER — Other Ambulatory Visit: Payer: Self-pay

## 2020-11-21 ENCOUNTER — Encounter: Payer: Self-pay | Admitting: Family Medicine

## 2020-11-21 VITALS — BP 114/78 | HR 80 | Ht 61.5 in | Wt 172.0 lb

## 2020-11-21 DIAGNOSIS — F324 Major depressive disorder, single episode, in partial remission: Secondary | ICD-10-CM | POA: Diagnosis not present

## 2020-11-21 DIAGNOSIS — I10 Essential (primary) hypertension: Secondary | ICD-10-CM

## 2020-11-21 DIAGNOSIS — R7301 Impaired fasting glucose: Secondary | ICD-10-CM

## 2020-11-21 DIAGNOSIS — G309 Alzheimer's disease, unspecified: Secondary | ICD-10-CM | POA: Diagnosis not present

## 2020-11-21 DIAGNOSIS — F02B0 Dementia in other diseases classified elsewhere, moderate, without behavioral disturbance, psychotic disturbance, mood disturbance, and anxiety: Secondary | ICD-10-CM | POA: Diagnosis not present

## 2020-11-21 DIAGNOSIS — E559 Vitamin D deficiency, unspecified: Secondary | ICD-10-CM | POA: Diagnosis not present

## 2020-11-21 DIAGNOSIS — E782 Mixed hyperlipidemia: Secondary | ICD-10-CM

## 2020-11-21 DIAGNOSIS — L98491 Non-pressure chronic ulcer of skin of other sites limited to breakdown of skin: Secondary | ICD-10-CM

## 2020-11-21 LAB — BAYER DCA HB A1C WAIVED: HB A1C (BAYER DCA - WAIVED): 5 % (ref 4.8–5.6)

## 2020-11-21 MED ORDER — MUPIROCIN CALCIUM 2 % EX CREA: 1.0000 "application " | TOPICAL_CREAM | Freq: Two times a day (BID) | CUTANEOUS | 0 refills | Status: DC

## 2020-11-21 MED ORDER — VENLAFAXINE HCL ER 150 MG PO CP24: 150.0000 mg | ORAL_CAPSULE | Freq: Every day | ORAL | 0 refills | Status: DC

## 2020-11-21 MED ORDER — BUPROPION HCL ER (XL) 300 MG PO TB24: ORAL_TABLET | ORAL | 1 refills | Status: DC

## 2020-11-21 MED ORDER — HYDROXYZINE HCL 25 MG PO TABS: 25.0000 mg | ORAL_TABLET | Freq: Three times a day (TID) | ORAL | 3 refills | Status: DC | PRN

## 2020-11-21 MED ORDER — ARIPIPRAZOLE 5 MG PO TABS: 5.0000 mg | ORAL_TABLET | Freq: Every day | ORAL | 1 refills | Status: DC

## 2020-11-21 MED ORDER — VITAMIN D (ERGOCALCIFEROL) 1.25 MG (50000 UNIT) PO CAPS: 50000.0000 [IU] | ORAL_CAPSULE | ORAL | 1 refills | Status: DC

## 2020-11-21 MED ORDER — HYDROCHLOROTHIAZIDE 25 MG PO TABS: 25.0000 mg | ORAL_TABLET | Freq: Every day | ORAL | 1 refills | Status: DC

## 2020-11-21 NOTE — Assessment & Plan Note (Signed)
Doing well. Continues with day program. Continue to monitor.

## 2020-11-21 NOTE — Telephone Encounter (Signed)
Bactroban reordered.

## 2020-11-21 NOTE — Assessment & Plan Note (Signed)
Rechecking labs today. Await results. Treat as needed.  °

## 2020-11-21 NOTE — Assessment & Plan Note (Signed)
Under good control on current regimen. Continue current regimen. Continue to monitor. Call with any concerns. Refills given. Labs drawn today.   

## 2020-11-21 NOTE — Progress Notes (Signed)
BP 114/78   Pulse 80   Ht 5' 1.5" (1.562 m)   Wt 172 lb (78 kg)   BMI 31.97 kg/m    Subjective:    Patient ID: [name] [name], female    DOB: Jan 25, 1951, 70 y.o.   MRN: [id number]  HPI: [name] [name] is a 70 y.o. female  Chief Complaint  Patient presents with   Hypertension   Depression   Hyperlipidemia    HYPERTENSION / HYPERLIPIDEMIA Satisfied with current treatment? yes Duration of hypertension: chronic BP monitoring frequency: not checking BP medication side effects: no Past BP meds: HCTZ Duration of hyperlipidemia: chronic Cholesterol medication side effects: not on anything Cholesterol supplements: none Past cholesterol medications: none Medication compliance: excellent compliance Aspirin: no Recent stressors: no Recurrent headaches: no Visual changes: no Palpitations: no Dyspnea: no Chest pain: no Lower extremity edema: no Dizzy/lightheaded: no  DEPRESSION Mood status: stable Satisfied with current treatment?: yes Symptom severity: mild  Duration of current treatment : chronic Side effects: no Medication compliance: excellent compliance Psychotherapy/counseling: no  Previous psychiatric medications: abilify, wellbutrin, effexor Depressed mood: no Anxious mood: no Anhedonia: no Significant weight loss or gain: no Insomnia: no  Fatigue: no Feelings of worthlessness or guilt: no Impaired concentration/indecisiveness: no Suicidal ideations: no Hopelessness: no Crying spells: no Depression screen [facility] 2/9 08/21/2020 05/23/2020 05/23/2020 05/02/2020 11/23/2019  Decreased Interest 0 1 1 0 1  Down, Depressed, Hopeless 0 1 0 0 1  PHQ - 2 Score 0 2 1 0 2  Altered sleeping 0 0 0 - 0  Tired, decreased energy 3 3 1  - 2  Change in appetite 0 0 0 - 0  Feeling bad or failure about yourself  0 0 0 - 0  Trouble concentrating 1 3 3  - 2  Moving slowly or fidgety/restless 0 1 2 - 0  Suicidal thoughts 0 0 0 - 0  PHQ-9 Score 4 9 7  - 6  Difficult doing  work/chores Not difficult at all Somewhat difficult Very difficult - Somewhat difficult  Some recent data might be hidden   SKIN LESION Duration:  3 months Location: under pannus on belly Painful: yes Itching: no Onset:  unsure Context: bigger History of skin cancer: no  Relevant past medical, surgical, family and social history reviewed and updated as indicated. Interim medical history since our last visit reviewed. Allergies and medications reviewed and updated.  Review of Systems  Constitutional: Negative.   Respiratory: Negative.    Cardiovascular: Negative.   Gastrointestinal: Negative.   Musculoskeletal: Negative.   Skin:  Positive for wound. Negative for color change, pallor and rash.  Psychiatric/Behavioral: Negative.     Per HPI unless specifically indicated above     Objective:    BP 114/78   Pulse 80   Ht 5' 1.5" (1.562 m)   Wt 172 lb (78 kg)   BMI 31.97 kg/m   Wt Readings from Last 3 Encounters:  11/21/20 172 lb (78 kg)  10/30/20 169 lb 9.6 oz (76.9 kg)  10/03/20 171 lb 8 oz (77.8 kg)    Physical Exam Vitals and nursing note reviewed.  Constitutional:      General: She is not in acute distress.    Appearance: Normal appearance. She is not ill-appearing, toxic-appearing or diaphoretic.  HENT:     Head: Normocephalic and atraumatic.     Right Ear: External ear normal.     Left Ear: External ear normal.     Nose: Nose normal.  Mouth/Throat:     Mouth: Mucous membranes are moist.     Pharynx: Oropharynx is clear.  Eyes:     General: No scleral icterus.       Right eye: No discharge.        Left eye: No discharge.     Extraocular Movements: Extraocular movements intact.     Conjunctiva/sclera: Conjunctivae normal.     Pupils: Pupils are equal, round, and reactive to light.  Cardiovascular:     Rate and Rhythm: Normal rate and regular rhythm.     Pulses: Normal pulses.     Heart sounds: Normal heart sounds. No murmur heard.   No friction rub.  No gallop.  Pulmonary:     Effort: Pulmonary effort is normal. No respiratory distress.     Breath sounds: Normal breath sounds. No stridor. No wheezing, rhonchi or rales.  Chest:     Chest wall: No tenderness.  Musculoskeletal:        General: Normal range of motion.     Cervical back: Normal range of motion and neck supple.  Skin:    General: Skin is warm and dry.     Capillary Refill: Capillary refill takes less than 2 seconds.     Coloration: Skin is not jaundiced or pale.     Findings: No bruising, erythema, lesion or rash.       Neurological:     General: No focal deficit present.     Mental Status: She is alert and oriented to person, place, and time. Mental status is at baseline.  Psychiatric:        Mood and Affect: Mood normal.        Behavior: Behavior normal.        Thought Content: Thought content normal.        Judgment: Judgment normal.    Results for orders placed or performed in visit on 08/20/20  QuantiFERON-TB Gold Plus  Result Value Ref Range   QuantiFERON Incubation Incubation performed.    QuantiFERON Criteria Comment    QuantiFERON TB1 Ag Value 0.02 IU/mL   QuantiFERON TB2 Ag Value 0.03 IU/mL   QuantiFERON Nil Value 0.03 IU/mL   QuantiFERON Mitogen Value >10.00 IU/mL   QuantiFERON-TB Gold Plus Negative Negative      Assessment & Plan:   Problem List Items Addressed This Visit       Cardiovascular and Mediastinum   Essential (primary) hypertension - Primary    Under good control on current regimen. Continue current regimen. Continue to monitor. Call with any concerns. Refills given. Labs drawn today.       Relevant Medications   hydrochlorothiazide (HYDRODIURIL) 25 MG tablet   Other Relevant Orders   CBC with Differential/Platelet   Comprehensive metabolic panel   TSH     Endocrine   Impaired fasting glucose    Rechecking labs today. Await results. Treat as needed.       Relevant Orders   CBC with Differential/Platelet    Comprehensive metabolic panel   Bayer DCA Hb M0Q Waived     Nervous and Auditory   Dementia (HCC)    Doing well. Continues with day program. Continue to monitor.       Relevant Medications   ARIPiprazole (ABILIFY) 5 MG tablet   buPROPion (WELLBUTRIN XL) 300 MG 24 hr tablet   hydrOXYzine (ATARAX/VISTARIL) 25 MG tablet   venlafaxine XR (EFFEXOR-XR) 150 MG 24 hr capsule     Other   Depression    Doing  well. Does not want to change medicine right now. Continue to monitor. Refills given.      Relevant Medications   buPROPion (WELLBUTRIN XL) 300 MG 24 hr tablet   hydrOXYzine (ATARAX/VISTARIL) 25 MG tablet   venlafaxine XR (EFFEXOR-XR) 150 MG 24 hr capsule   Other Relevant Orders   CBC with Differential/Platelet   Comprehensive metabolic panel   Hyperlipidemia    Rechecking labs today. Await results. Treat as needed.       Relevant Medications   hydrochlorothiazide (HYDRODIURIL) 25 MG tablet   Other Relevant Orders   CBC with Differential/Platelet   Comprehensive metabolic panel   Lipid Panel w/o Chol/HDL Ratio   Vitamin D deficiency    Rechecking labs today. Await results. Treat as needed.       Relevant Orders   CBC with Differential/Platelet   Comprehensive metabolic panel   VITAMIN D 25 Hydroxy (Vit-D Deficiency, Fractures)   Other Visit Diagnoses     Skin ulcer, limited to breakdown of skin (HCC)       x3 months. Will get her into wound care. Referral generated today. Call with any concerns.    Relevant Orders   Ambulatory referral to Wound Clinic        Follow up plan: Return in about 6 months (around 05/22/2021), or physcial.

## 2020-11-21 NOTE — Telephone Encounter (Signed)
[name] [name] from [facility] called to request for the prescription for Bactroban to be resubmited, trasmission failed. Patient is waiting at the pharmacy.  Please advise.

## 2020-11-21 NOTE — Assessment & Plan Note (Signed)
Doing well. Does not want to change medicine right now. Continue to monitor. Refills given.

## 2020-11-22 LAB — COMPREHENSIVE METABOLIC PANEL
ALT: 12 IU/L (ref 0–32)
AST: 14 IU/L (ref 0–40)
Albumin/Globulin Ratio: 2.3 — ABNORMAL HIGH (ref 1.2–2.2)
Albumin: 4.2 g/dL (ref 3.7–4.7)
Alkaline Phosphatase: 65 IU/L (ref 44–121)
BUN/Creatinine Ratio: 20 (ref 12–28)
BUN: 20 mg/dL (ref 8–27)
Bilirubin Total: <0.2 mg/dL (ref 0.0–1.2)
CO2: 26 mmol/L (ref 20–29)
Calcium: 9.2 mg/dL (ref 8.7–10.3)
Chloride: 103 mmol/L (ref 96–106)
Creatinine, Ser: 1.02 mg/dL — ABNORMAL HIGH (ref 0.57–1.00)
Globulin, Total: 1.8 g/dL (ref 1.5–4.5)
Glucose: 88 mg/dL (ref 70–99)
Potassium: 4 mmol/L (ref 3.5–5.2)
Sodium: 144 mmol/L (ref 134–144)
Total Protein: 6 g/dL (ref 6.0–8.5)
eGFR: 59 mL/min/{1.73_m2} — ABNORMAL LOW (ref >59)

## 2020-11-22 LAB — CBC WITH DIFFERENTIAL/PLATELET
Basophils Absolute: 0 10*3/uL (ref 0.0–0.2)
Basos: 1 %
EOS (ABSOLUTE): 0.2 10*3/uL (ref 0.0–0.4)
Eos: 3 %
Hematocrit: 39.6 % (ref 34.0–46.6)
Hemoglobin: 12.7 g/dL (ref 11.1–15.9)
Immature Grans (Abs): 0 10*3/uL (ref 0.0–0.1)
Immature Granulocytes: 0 %
Lymphocytes Absolute: 1.5 10*3/uL (ref 0.7–3.1)
Lymphs: 22 %
MCH: 27.5 pg (ref 26.6–33.0)
MCHC: 32.1 g/dL (ref 31.5–35.7)
MCV: 86 fL (ref 79–97)
Monocytes Absolute: 0.5 10*3/uL (ref 0.1–0.9)
Monocytes: 7 %
Neutrophils Absolute: 4.6 10*3/uL (ref 1.4–7.0)
Neutrophils: 67 %
Platelets: 224 10*3/uL (ref 150–450)
RBC: 4.61 x10E6/uL (ref 3.77–5.28)
RDW: 12.9 % (ref 11.7–15.4)
WBC: 6.8 10*3/uL (ref 3.4–10.8)

## 2020-11-22 LAB — LIPID PANEL W/O CHOL/HDL RATIO
Cholesterol, Total: 195 mg/dL (ref 100–199)
HDL: 42 mg/dL (ref >39)
LDL Chol Calc (NIH): 120 mg/dL — ABNORMAL HIGH (ref 0–99)
Triglycerides: 186 mg/dL — ABNORMAL HIGH (ref 0–149)
VLDL Cholesterol Cal: 33 mg/dL (ref 5–40)

## 2020-11-22 LAB — TSH: TSH: 1.22 u[IU]/mL (ref 0.450–4.500)

## 2020-11-22 LAB — VITAMIN D 25 HYDROXY (VIT D DEFICIENCY, FRACTURES): Vit D, 25-Hydroxy: 36 ng/mL (ref 30.0–100.0)

## 2020-11-28 ENCOUNTER — Encounter: Payer: Self-pay | Admitting: Family Medicine

## 2020-12-01 ENCOUNTER — Other Ambulatory Visit: Payer: Self-pay

## 2020-12-01 ENCOUNTER — Encounter: Payer: Medicare PPO | Attending: Physician Assistant | Admitting: Physician Assistant

## 2020-12-01 DIAGNOSIS — L98492 Non-pressure chronic ulcer of skin of other sites with fat layer exposed: Secondary | ICD-10-CM | POA: Diagnosis not present

## 2020-12-01 DIAGNOSIS — Z885 Allergy status to narcotic agent status: Secondary | ICD-10-CM | POA: Diagnosis not present

## 2020-12-01 DIAGNOSIS — Z87891 Personal history of nicotine dependence: Secondary | ICD-10-CM | POA: Diagnosis not present

## 2020-12-01 DIAGNOSIS — F0280 Dementia in other diseases classified elsewhere without behavioral disturbance: Secondary | ICD-10-CM | POA: Diagnosis not present

## 2020-12-01 DIAGNOSIS — I10 Essential (primary) hypertension: Secondary | ICD-10-CM | POA: Diagnosis not present

## 2020-12-01 DIAGNOSIS — G308 Other Alzheimer's disease: Secondary | ICD-10-CM | POA: Diagnosis not present

## 2020-12-01 DIAGNOSIS — B354 Tinea corporis: Secondary | ICD-10-CM | POA: Diagnosis not present

## 2020-12-02 NOTE — Progress Notes (Signed)
[name] [name] ([id number]) Visit Report for 12/01/2020 Allergy List Details Patient Name: [name] [name], [name] Date of Service: 12/01/2020 8:45 AM Medical Record Number: [id number] Patient Account Number: [id number] Date of Birth/Sex: April 30, 1950 (70 y.o. F) Treating RN: [name] [name] Primary Care [name] [name]: [name] [name] Other Clinician: Referring [name] [name]: [name] [name] Treating [name] [name]/Extender: [name] [name] in Treatment: 0 Allergies Active Allergies codeine Tussionex Allergy Notes Electronic Signature(s) Signed: 12/01/2020 6:56:11 PM By: [name] Pax RN Entered By: [name] [name] on 12/01/2020 09:00:29 [name] [name], [name] [name] ([id number]) -------------------------------------------------------------------------------- Arrival Information Details Patient Name: [name], [name] Date of Service: 12/01/2020 8:45 AM Medical Record Number: [id number] Patient Account Number: [id number] Date of Birth/Sex: 04/25/1950 (70 y.o. F) Treating RN: [name] [name] Primary Care [name] [name]: [name] [name] Other Clinician: Referring [name] [name]: [name] [name] Treating [name] [name]/Extender: [name] [name] in Treatment: 0 Visit Information Patient Arrived: Ambulatory Arrival Time: 08:57 Accompanied By: husband Transfer Assistance: None Patient Identification Verified: Yes Secondary Verification Process Completed: Yes Patient Requires Transmission-Based Precautions: No Patient Has Alerts: No Electronic Signature(s) Signed: 12/01/2020 6:56:11 PM By: [name] Pax RN Entered By: [name] [name] on 12/01/2020 08:57:47 [name] [name], [name] [name] ([id number]) -------------------------------------------------------------------------------- Clinic Level of Care Assessment Details Patient Name: [name], [name] Date of Service: 12/01/2020 8:45 AM Medical Record Number: [id number] Patient Account Number: [id number] Date of Birth/Sex: Jan 09, 1951 (70 y.o. F) Treating RN: [name] [name] Primary Care Hartman Minahan: [name] [name] Other  Clinician: Referring [name] [name]: [name] [name] Treating [name] [name]/Extender: [name] [name] in Treatment: 0 Clinic Level of Care Assessment Items TOOL 2 Quantity Score X - Use when only an EandM is performed on the INITIAL visit 1 0 ASSESSMENTS - Nursing Assessment / Reassessment X - General Physical Exam (combine w/ comprehensive assessment (listed just below) when performed on new 1 20 pt. evals) X- 1 25 Comprehensive Assessment (HX, ROS, Risk Assessments, Wounds Hx, etc.) ASSESSMENTS - Wound and Skin Assessment / Reassessment X - Simple Wound Assessment / Reassessment - one wound 1 5 []  - 0 Complex Wound Assessment / Reassessment - multiple wounds []  - 0 Dermatologic / Skin Assessment (not related to wound area) ASSESSMENTS - Ostomy and/or Continence Assessment and Care []  - Incontinence Assessment and Management 0 []  - 0 Ostomy Care Assessment and Management (repouching, etc.) PROCESS - Coordination of Care X - Simple Patient / Family Education for ongoing care 1 15 []  - 0 Complex (extensive) Patient / Family Education for ongoing care X- 1 10 Staff obtains , Records, Test Results / Process Orders []  - 0 Staff telephones HHA, Nursing Homes / Clarify orders / etc []  - 0 Routine Transfer to another Facility (non-emergent condition) []  - 0 Routine Hospital Admission (non-emergent condition) X- 1 15 New Admissions / / Ordering NPWT, Apligraf, etc. []  - 0 Emergency Hospital Admission (emergent condition) X- 1 10 Simple Discharge Coordination []  - 0 Complex (extensive) Discharge Coordination PROCESS - Special Needs []  - Pediatric / Minor Patient Management 0 []  - 0 Isolation Patient Management []  - 0 Hearing / Language / Visual special needs []  - 0 Assessment of Community assistance (transportation, D/C planning, etc.) []  - 0 Additional assistance / Altered mentation []  - 0 Support Surface(s) Assessment (bed, cushion, seat,  etc.) INTERVENTIONS - Wound Cleansing / Measurement X - Wound Imaging (photographs - any number of wounds) 1 5 []  - 0 Wound Tracing (instead of photographs) X- 1 5 Simple Wound Measurement - one wound []  - 0 Complex Wound Measurement - multiple wounds [name] [name], [name] ( ) X- 1 5 Simple Wound Cleansing -  one wound []  - 0 Complex Wound Cleansing - multiple wounds INTERVENTIONS - Wound Dressings X - Small Wound Dressing one or multiple wounds 1 10 []  - 0 Medium Wound Dressing one or multiple wounds []  - 0 Large Wound Dressing one or multiple wounds []  - 0 Application of Medications - injection INTERVENTIONS - Miscellaneous []  - External ear exam 0 []  - 0 Specimen Collection (cultures, biopsies, blood, body fluids, etc.) []  - 0 Specimen(s) / Culture(s) sent or taken to Lab for analysis []  - 0 Patient Transfer (multiple staff / / Similar devices) []  - 0 Simple Staple / Suture removal (25 or less) []  - 0 Complex Staple / Suture removal (26 or more) []  - 0 Hypo / Hyperglycemic Management (close monitor of Blood Glucose) []  - 0 Ankle / Brachial Index (ABI) - do not check if billed separately Has the patient been seen at the hospital within the last three years: Yes Total Score: 125 Level Of Care: New/Established - Level 4 Electronic Signature(s) Signed: 12/01/2020 6:56:11 PM By: RN Entered By: on 12/01/2020 16:16:37 [name] [name], ( ) -------------------------------------------------------------------------------- Encounter Discharge Information Details Patient Name: [name], [name] Date of Service: 12/01/2020 8:45 AM Medical Record Number: Nurse, adult Patient Account Number: Date of Birth/Sex: 1951-01-10 (70 y.o. F) Treating RN: Primary Care Blair Lundeen: 12/03/2020 Other Clinician: Referring Vallie Teters: [name] [name] Treating Jaterrius Ricketson/Extender: [name] [name] in Treatment: 0 Encounter  Discharge Information Items Post Procedure Vitals Discharge Condition: Stable Temperature (F): 98.3 Ambulatory Status: Ambulatory Pulse (bpm): 76 Discharge Destination: Home Respiratory Rate (breaths/min): 18 Transportation: Private Auto Blood Pressure (mmHg): 113/74 Accompanied By: self Schedule Follow-up Appointment: Yes Clinical Summary of Care: Patient Declined Electronic Signature(s) Signed: 12/01/2020 6:56:11 PM By: [name] Pang RN Entered By: [id number] on 12/01/2020 10:21:16 [place], [id number] ([id number]) -------------------------------------------------------------------------------- Lower Extremity Assessment Details Patient Name: [name] [name], [name] Date of Service: 12/01/2020 8:45 AM Medical Record Number: ([id number] Patient Account Number: [name] [name] Date of Birth/Sex: 04/17/1950 (70 y.o. F) Treating RN: [name] [name] Primary Care Sedalia Greeson: 12/03/2020 Other Clinician: Referring Nasario Czerniak: [name] [name] Treating Kadasia Kassing/Extender: [name] [name] [name] in Treatment: 0 Electronic Signature(s) Signed: 12/01/2020 6:56:11 PM By: [name] Pang RN Entered By: [id number] on 12/01/2020 09:05:36 [place], [id number] ([id number]) -------------------------------------------------------------------------------- Multi Wound Chart Details Patient Name: [name] [name], [name] Date of Service: 12/01/2020 8:45 AM Medical Record Number: ([id number] Patient Account Number: [name] [name] Date of Birth/Sex: 04/30/1950 (70 y.o. F) Treating RN: [name] Derry Primary Care Ryelan Kazee: 12/03/2020 Other Clinician: Referring Ziyanna Tolin: [name] [name] Treating Anaalicia Reimann/Extender: [name] [name] [name] in Treatment: 0 Vital Signs Height(in): 64 Pulse(bpm): 76 Weight(lbs): 172 Blood Pressure(mmHg): 113/74 Body Mass Index(BMI): 30 Temperature(F): 98.3 Respiratory Rate(breaths/min): 18 Photos: [N/A:N/A] Wound Location: Abdomen - midline N/A N/A Wounding Event: Gradually Appeared N/A N/A Primary Etiology:  Atypical N/A N/A Comorbid History: Hypertension N/A N/A Date Acquired: 09/01/2020 N/A N/A [name] of Treatment: 0 N/A N/A Wound Status: Open N/A N/A Measurements L x W x D (cm) 1.02x1.5x0.2 N/A N/A Area (cm) : 1.202 N/A N/A Volume (cm) : 0.24 N/A N/A Classification: Full Thickness Without Exposed N/A N/A Support Structures Exudate Amount: Medium N/A N/A Exudate Type: Serosanguineous N/A N/A Exudate Color: red, brown N/A N/A Granulation Amount: Medium (34-66%) N/A N/A Granulation Quality: Red N/A N/A Necrotic Amount: Medium (34-66%) N/A N/A Exposed Structures: Fat Layer (Subcutaneous Tissue): N/A N/A Yes Fascia: No Tendon: No Muscle: No Joint: No Bone: No Treatment Notes Electronic Signature(s) Signed: 12/01/2020 6:56:11 PM By: [id number],  Lyla Son RN Entered By: [name] [name] on 12/01/2020 09:40:11 Birge, [name] [name] ([id number]) -------------------------------------------------------------------------------- Multi-Disciplinary Care Plan Details Patient Name: [name] [name], [name] Date of Service: 12/01/2020 8:45 AM Medical Record Number: [id number] Patient Account Number: [id number] Date of Birth/Sex: 1950-11-24 (70 y.o. F) Treating RN: [name] [name] Primary Care Skyley Grandmaison: [name] [name] Other Clinician: Referring Noelle Hoogland: [name] [name] Treating Quintana Canelo/Extender: [name] [name] in Treatment: 0 Active Inactive Nutrition Nursing Diagnoses: Potential for alteratiion in Nutrition/Potential for imbalanced nutrition Goals: Patient/caregiver agrees to and verbalizes understanding of need to use nutritional supplements and/or vitamins as prescribed Date Initiated: 12/01/2020 Target Resolution Date: 12/31/2020 Goal Status: Active Interventions: Assess patient nutrition upon admission and as needed per policy Notes: Wound/Skin Impairment Nursing Diagnoses: Knowledge deficit related to ulceration/compromised skin integrity Goals: Patient will have a decrease in wound volume by X% from  date: (specify in notes) Date Initiated: 12/01/2020 Target Resolution Date: 12/31/2020 Goal Status: Active Patient/caregiver will verbalize understanding of skin care regimen Date Initiated: 12/01/2020 Target Resolution Date: 01/30/2021 Goal Status: Active Ulcer/skin breakdown will have a volume reduction of 30% by week 4 Date Initiated: 12/01/2020 Target Resolution Date: 03/02/2021 Goal Status: Active Ulcer/skin breakdown will have a volume reduction of 50% by week 8 Date Initiated: 12/01/2020 Target Resolution Date: 03/31/2021 Goal Status: Active Ulcer/skin breakdown will have a volume reduction of 80% by week 12 Date Initiated: 12/01/2020 Target Resolution Date: 04/28/2021 Goal Status: Active Ulcer/skin breakdown will heal within 14 [name] Date Initiated: 12/01/2020 Target Resolution Date: 05/29/2021 Goal Status: Active Interventions: Assess patient/caregiver ability to obtain necessary supplies Assess patient/caregiver ability to perform ulcer/skin care regimen upon admission and as needed Assess ulceration(s) every visit Notes: Electronic Signature(s) Signed: 12/01/2020 6:56:11 PM By: [name] Pax RN Entered By: [name] [name] on 12/01/2020 09:39:09 [name] [name], [name] [name] ([id number]) -------------------------------------------------------------------------------- Pain Assessment Details Patient Name: [name] [name], [name] Date of Service: 12/01/2020 8:45 AM Medical Record Number: [id number] Patient Account Number: [id number] Date of Birth/Sex: October 04, 1950 (70 y.o. F) Treating RN: [name] [name] Primary Care Kristien Salatino: [name] [name] Other Clinician: Referring Janeen Watson: [name] [name] Treating Nadiya Pieratt/Extender: [name] [name] in Treatment: 0 Active Problems Location of Pain Severity and Description of Pain Patient Has Paino No Site Locations Pain Management and Medication Current Pain Management: Electronic Signature(s) Signed: 12/01/2020 6:56:11 PM By: [name] Pax RN Entered By:  [name] [name] on 12/01/2020 08:58:15 Rackley, [name] [name] ([id number]) -------------------------------------------------------------------------------- Patient/Caregiver Education Details Patient Name: [name] [name], [name] Date of Service: 12/01/2020 8:45 AM Medical Record Number: [id number] Patient Account Number: [id number] Date of Birth/Gender: [birth year] (70 y.o. F) Treating RN: [name] [name] Primary Care Physician: [name] [name] Other Clinician: Referring Physician: Olevia [name] Treating Physician/Extender: [name] [name] in Treatment: 0 Education Assessment Education Provided To: Patient Education Topics Provided Wound/Skin Impairment: Methods: Explain/Verbal Responses: State content correctly Electronic Signature(s) Signed: 12/01/2020 6:56:11 PM By: [name] Pax RN Entered By: [name] [name] on 12/01/2020 16:16:57 [name] [name], [name] [name] ([id number]) -------------------------------------------------------------------------------- Wound Assessment Details Patient Name: [name] [name], Miosotis L. Date of Service: 12/01/2020 8:45 AM Medical Record Number: [id number] Patient Account Number: [id number] Date of Birth/Sex: 02-14-1950 (70 y.o. F) Treating RN: [name] [name] Primary Care Winnona Wargo: [name] [name] Other Clinician: Referring Roseann Kees: [name] [name] Treating Damare Serano/Extender: [name] [name] in Treatment: 0 Wound Status Wound Number: 1 Primary Etiology: Atypical Wound Location: Abdomen - midline Wound Status: Open Wounding Event: Gradually Appeared Comorbid History: Hypertension Date Acquired: 09/01/2020 [name] Of Treatment: 0 Clustered Wound: No Photos Wound Measurements Length: (cm) 1.02 Width: (cm) 1.5 Depth: (cm) 0.2 Area: (cm) 1.202 Volume: (cm) 0.24 % Reduction  in Area: % Reduction in Volume: Tunneling: No Undermining: No Wound Description Classification: Full Thickness Without Exposed Support Structu Exudate Amount: Medium Exudate Type: Serosanguineous Exudate Color:  red, brown res Foul Odor After Cleansing: No Slough/Fibrino Yes Wound Bed Granulation Amount: Medium (34-66%) Exposed Structure Granulation Quality: Red Fascia Exposed: No Necrotic Amount: Medium (34-66%) Fat Layer (Subcutaneous Tissue) Exposed: Yes Necrotic Quality: Adherent Slough Tendon Exposed: No Muscle Exposed: No Joint Exposed: No Bone Exposed: No Electronic Signature(s) Signed: 12/01/2020 6:56:11 PM By: [name] Pax RN Entered By: [name] [name] on 12/01/2020 09:13:27 Stachowski, [name] [name] ([id number]) -------------------------------------------------------------------------------- Vitals Details Patient Name: Keitt, Jesicca L. Date of Service: 12/01/2020 8:45 AM Medical Record Number: [id number] Patient Account Number: [id number] Date of Birth/Sex: 1950/02/03 (70 y.o. F) Treating RN: [name] [name] Primary Care Tyishia Aune: [name] [name] Other Clinician: Referring Carmalita Wakefield: [name] [name] Treating Declan Mier/Extender: [name] [name] in Treatment: 0 Vital Signs Time Taken: 08:58 Temperature (F): 98.3 Height (in): 64 Pulse (bpm): 76 Source: Stated Respiratory Rate (breaths/min): 18 Weight (lbs): 172 Blood Pressure (mmHg): 113/74 Source: Stated Reference Range: 80 - 120 mg / dl Body Mass Index (BMI): 29.5 Electronic Signature(s) Signed: 12/01/2020 6:56:11 PM By: [name] Pax RN Entered By: [name] [name] on 12/01/2020 08:59:58

## 2020-12-02 NOTE — Progress Notes (Signed)
BRAILEY, BUESCHER ([id number]) Visit Report for 12/01/2020 Chief Complaint Document Details Patient Name: [name], [name] Date of Service: 12/01/2020 8:45 AM Medical Record Number: [id number] Patient Account Number: [id number] Date of Birth/Sex: [birth year] (70 y.o. F) Treating RN: Yevonne Pax Primary Care Provider: Olevia Perches Other Clinician: Referring Provider: Olevia Perches Treating Provider/Extender: Allen Derry Weeks in Treatment: 0 Information Obtained from: Patient Chief Complaint Midline abdominal ulcer Electronic Signature(s) Signed: 12/01/2020 9:35:49 AM By: Lenda Kelp PA-C Entered By: Lenda Kelp on 12/01/2020 09:35:49 Bradwell, [name] ([id number]) -------------------------------------------------------------------------------- Debridement Details Patient Name: [name], [name] Date of Service: 12/01/2020 8:45 AM Medical Record Number: [id number] Patient Account Number: [id number] Date of Birth/Sex: [birth year] (70 y.o. F) Treating RN: Yevonne Pax Primary Care Provider: Olevia Perches Other Clinician: Referring Provider: Olevia Perches Treating Provider/Extender: Allen Derry Weeks in Treatment: 0 Debridement Performed for Wound #1 Abdomen - midline Assessment: Performed By: Physician Nelida Meuse., PA-C Debridement Type: Chemical/Enzymatic/Mechanical Agent Used: saline and gauze Level of Consciousness (Pre- Awake and Alert procedure): Pre-procedure Verification/Time Out Yes - 09:40 Taken: Start Time: 09:40 Instrument: Other : saline gauze Bleeding: Minimum End Time: 09:41 Procedural Pain: 0 Post Procedural Pain: 0 Response to Treatment: Procedure was tolerated well Level of Consciousness (Post- Awake and Alert procedure): Post Debridement Measurements of Total Wound Length: (cm) 1 Width: (cm) 1.5 Depth: (cm) 0.2 Volume: (cm) 0.236 Character of Wound/Ulcer Post Debridement: Improved Post Procedure Diagnosis Same as  Pre-procedure Electronic Signature(s) Signed: 12/01/2020 3:58:49 PM By: Lenda Kelp PA-C Signed: 12/01/2020 6:56:11 PM By: Yevonne Pax RN Entered By: Yevonne Pax on 12/01/2020 09:41:23 Herbers, [name] ([id number]) -------------------------------------------------------------------------------- HPI Details Patient Name: [name], [name] Date of Service: 12/01/2020 8:45 AM Medical Record Number: [id number] Patient Account Number: [id number] Date of Birth/Sex: July 28, 1950 (70 y.o. F) Treating RN: Yevonne Pax Primary Care Provider: Olevia Perches Other Clinician: Referring Provider: Olevia Perches Treating Provider/Extender: Rowan Blase in Treatment: 0 History of Present Illness HPI Description: 12/01/2020 upon evaluation today patient presents for initial inspection here in the clinic concerning a wound over the midline lower abdominal region which is underneath her pannus. Subsequently she has been using Lotrisone and nystatin powder in general over the area but then subsequently had a region here in particular where she had a larger area of skin breakdown I think this is simply due to the skin contact to the opposite side skin and moisture trapping underneath. It does have a very distinctive ringworm type appearance with a central clearing and erythematous border around and then clear outside. She has a history of Alzheimer's and hypertension but otherwise no major medical problems. Electronic Signature(s) Signed: 12/01/2020 9:45:29 AM By: Lenda Kelp PA-C Entered By: Lenda Kelp on 12/01/2020 09:45:29 Letourneau, [name] ([id number]) -------------------------------------------------------------------------------- Physical Exam Details Patient Name: [name], [name] Date of Service: 12/01/2020 8:45 AM Medical Record Number: [id number] Patient Account Number: [id number] Date of Birth/Sex: January 07, 1951 (70 y.o. F) Treating RN: Yevonne Pax Primary Care Provider: Olevia Perches Other Clinician: Referring Provider: Olevia Perches Treating Provider/Extender: Allen Derry Weeks in Treatment: 0 Constitutional sitting or standing blood pressure is within target range for patient.. pulse regular and within target range for patient.Marland Kitchen respirations regular, non- labored and within target range for patient.Marland Kitchen temperature within target range for patient.. Well-nourished and well-hydrated in no acute distress. Eyes conjunctiva clear no eyelid edema noted. pupils equal round and reactive to light and accommodation. Ears, Nose, Mouth, and Throat no gross abnormality of ear auricles or external  auditory canals. normal hearing noted during conversation. mucus membranes moist. Respiratory normal breathing without difficulty. Musculoskeletal normal gait and posture. no significant deformity or arthritic changes, no loss or range of motion, no clubbing. Psychiatric Patient is not able to cooperate in decision making regarding care. Patient is oriented to person only. pleasant and cooperative. Notes Upon inspection patient's wound bed actually showed signs of good granulation epithelization at this point and most of the abdominal area this is much better. Fortunately there does not appear to be any signs of active infection which is also excellent. Overall I feel like that the patient is doing great and there does not appear to be any signs of active infection at this time. Electronic Signature(s) Signed: 12/01/2020 9:46:31 AM By: Lenda Kelp PA-C Entered By: Lenda Kelp on 12/01/2020 09:46:31 Atwood, [name] ([id number]) -------------------------------------------------------------------------------- Physician Orders Details Patient Name: [name], [name] Date of Service: 12/01/2020 8:45 AM Medical Record Number: [id number] Patient Account Number: [id number] Date of Birth/Sex: Oct 05, 1950 (70 y.o. F) Treating RN: Yevonne Pax Primary Care Provider: Olevia Perches  Other Clinician: Referring Provider: Olevia Perches Treating Provider/Extender: Rowan Blase in Treatment: 0 Verbal / Phone Orders: No Diagnosis Coding ICD-10 Coding Code Description B35.4 Tinea corporis L98.492 Non-pressure chronic ulcer of skin of other sites with fat layer exposed G30.8 Other Alzheimer's disease I10 Essential (primary) hypertension Follow-up Appointments o Return Appointment in 1 week. Bathing/ Shower/ Hygiene o May shower; gently cleanse wound with antibacterial soap, rinse and pat dry prior to dressing wounds Wound Treatment Wound #1 - Abdomen - midline Topical: Nystop Nystatin Powder, 15 (g) bottle 1 x Per Day/30 Days Topical: lotrisone 1 x Per Day/30 Days Primary Dressing: Silvercel Small 2x2 (in/in) 1 x Per Day/30 Days Discharge Instructions: Apply Silvercel Small 2x2 (in/in) as instructed Primary Dressing: Zetuvit Plus Silicone Border Dressing 4x4 (in/in) (DME) (Generic) 1 x Per Day/30 Days Electronic Signature(s) Signed: 12/01/2020 3:58:49 PM By: Lenda Kelp PA-C Signed: 12/01/2020 6:56:11 PM By: Yevonne Pax RN Entered By: Yevonne Pax on 12/01/2020 09:44:39 Shabazz, [name] ([id number]) -------------------------------------------------------------------------------- Problem List Details Patient Name: Madej, Curtisha L. Date of Service: 12/01/2020 8:45 AM Medical Record Number: [id number] Patient Account Number: [id number] Date of Birth/Sex: [birth year] (70 y.o. F) Treating RN: Yevonne Pax Primary Care Provider: Olevia Perches Other Clinician: Referring Provider: Olevia Perches Treating Provider/Extender: Allen Derry Weeks in Treatment: 0 Active Problems ICD-10 Encounter Code Description Active Date MDM Diagnosis B35.4 Tinea corporis 12/01/2020 No Yes L98.492 Non-pressure chronic ulcer of skin of other sites with fat layer exposed 12/01/2020 No Yes G30.8 Other Alzheimer's disease 12/01/2020 No Yes I10 Essential (primary) hypertension  12/01/2020 No Yes Inactive Problems Resolved Problems Electronic Signature(s) Signed: 12/01/2020 9:35:24 AM By: Lenda Kelp PA-C Entered By: Lenda Kelp on 12/01/2020 09:35:23 Haberman, [name] ([id number]) -------------------------------------------------------------------------------- Progress Note Details Patient Name: Derwin, Bristyl L. Date of Service: 12/01/2020 8:45 AM Medical Record Number: [id number] Patient Account Number: [id number] Date of Birth/Sex: 05/13/1950 (70 y.o. F) Treating RN: Yevonne Pax Primary Care Provider: Olevia Perches Other Clinician: Referring Provider: Olevia Perches Treating Provider/Extender: Rowan Blase in Treatment: 0 Subjective Chief Complaint Information obtained from Patient Midline abdominal ulcer History of Present Illness (HPI) 12/01/2020 upon evaluation today patient presents for initial inspection here in the clinic concerning a wound over the midline lower abdominal region which is underneath her pannus. Subsequently she has been using Lotrisone and nystatin powder in general over the area but then subsequently had a region here in particular  where she had a larger area of skin breakdown I think this is simply due to the skin contact to the opposite side skin and moisture trapping underneath. It does have a very distinctive ringworm type appearance with a central clearing and erythematous border around and then clear outside. She has a history of Alzheimer's and hypertension but otherwise no major medical problems. Patient History Allergies codeine, Tussionex Social History Former smoker, Marital Status - Married, Alcohol Use - Never, Drug Use - No History, Caffeine Use - Daily. Medical History Cardiovascular Patient has history of Hypertension Objective Constitutional sitting or standing blood pressure is within target range for patient.. pulse regular and within target range for patient.Marland Kitchen respirations regular, non- labored  and within target range for patient.Marland Kitchen temperature within target range for patient.. Well-nourished and well-hydrated in no acute distress. Vitals Time Taken: 8:58 AM, Height: 64 in, Source: Stated, Weight: 172 lbs, Source: Stated, BMI: 29.5, Temperature: 98.3 F, Pulse: 76 bpm, Respiratory Rate: 18 breaths/min, Blood Pressure: 113/74 mmHg. Eyes conjunctiva clear no eyelid edema noted. pupils equal round and reactive to light and accommodation. Ears, Nose, Mouth, and Throat no gross abnormality of ear auricles or external auditory canals. normal hearing noted during conversation. mucus membranes moist. Respiratory normal breathing without difficulty. Musculoskeletal normal gait and posture. no significant deformity or arthritic changes, no loss or range of motion, no clubbing. Psychiatric Patient is not able to cooperate in decision making regarding care. Patient is oriented to person only. pleasant and cooperative. General Notes: Upon inspection patient's wound bed actually showed signs of good granulation epithelization at this point and most of the abdominal area this is much better. Fortunately there does not appear to be any signs of active infection which is also excellent. Overall I feel like that the patient is doing great and there does not appear to be any signs of active infection at this time. Buschman, Jarita L. ([id number]) Integumentary (Hair, Skin) Wound #1 status is Open. Original cause of wound was Gradually Appeared. The date acquired was: 09/01/2020. The wound is located on the Abdomen - midline. The wound measures 1.02cm length x 1.5cm width x 0.2cm depth; 1.202cm^2 area and 0.24cm^3 volume. There is Fat Layer (Subcutaneous Tissue) exposed. There is no tunneling or undermining noted. There is a medium amount of serosanguineous drainage noted. There is medium (34-66%) red granulation within the wound bed. There is a medium (34-66%) amount of necrotic tissue within the wound bed  including Adherent Slough. 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 There was a Chemical/Enzymatic/Mechanical debridement performed by Nelida Meuse., PA-C. With the following instrument(s): saline gauze. Other agent used was saline and gauze. A time out was conducted at 09:40, prior to the start of the procedure. A Minimum amount of bleeding was controlled with N/A. The procedure was tolerated well with a pain level of 0 throughout and a pain level of 0 following the procedure. Post Debridement Measurements: 1cm length x 1.5cm width x 0.2cm depth; 0.236cm^3 volume. Character of Wound/Ulcer Post Debridement is improved. Post procedure Diagnosis Wound #1: Same as Pre-Procedure Plan Follow-up Appointments: Return Appointment in 1 week. Bathing/ Shower/ Hygiene: May shower; gently cleanse wound with antibacterial soap, rinse and pat dry prior to dressing wounds WOUND #1: - Abdomen - midline Wound Laterality: Topical: Nystop Nystatin  Powder, 15 (g) bottle 1 x Per Day/30 Days Topical: lotrisone 1 x Per Day/30 Days Primary Dressing: Silvercel Small 2x2 (in/in) 1 x Per Day/30 Days Discharge Instructions: Apply Silvercel Small 2x2 (in/in) as instructed Primary Dressing: Zetuvit Plus Silicone Border Dressing 4x4 (in/in) (DME) (Generic) 1 x Per Day/30 Days 1. Would recommend currently that we go ahead and initiate treatment with a continuation of the Lotrisone to the open area followed by nystatin powder to cover. We will then cover this with a silver alginate dressing followed by the Zetuvit border foam which I think will be easier on her skin and not cause any tearing or otherwise. 2. Muscle can recommend that we have the patient continue to change this  daily that will keep it clean and dry and hopefully will be able to see the improvement fairly rapidly here. I think the key is preventing the moisture trapping and skin skin contact. We will see patient back for reevaluation in 1 week here in the clinic. If anything worsens or changes patient will contact our office for additional recommendations. Electronic Signature(s) Signed: 12/01/2020 9:47:47 AM By: Lenda Kelp PA-C Entered By: Lenda Kelp on 12/01/2020 09:47:47 Urbanowicz, [name] ([id number]) -------------------------------------------------------------------------------- ROS/PFSH Details Patient Name: Tonkinson, Serrena L. Date of Service: 12/01/2020 8:45 AM Medical Record Number: [id number] Patient Account Number: [id number] Date of Birth/Sex: March 27, 1950 (70 y.o. F) Treating RN: Yevonne Pax Primary Care Provider: Olevia Perches Other Clinician: Referring Provider: Olevia Perches Treating Provider/Extender: Rowan Blase in Treatment: 0 Cardiovascular Medical History: Positive for: Hypertension Immunizations Pneumococcal Vaccine: Received Pneumococcal Vaccination: Yes Received Pneumococcal Vaccination On or After 60th Birthday: Yes Implantable Devices None Family and Social History Former smoker; Marital Status - Married; Alcohol Use: Never; Drug Use: No History; Caffeine Use: Daily; Financial Concerns: No; Food, Clothing or Shelter Needs: No; Support System Lacking: No; Transportation Concerns: No Electronic Signature(s) Signed: 12/01/2020 3:58:49 PM By: Lenda Kelp PA-C Signed: 12/01/2020 6:56:11 PM By: Yevonne Pax RN Entered By: Yevonne Pax on 12/01/2020 09:02:09 Haviland, [name] ([id number]) -------------------------------------------------------------------------------- SuperBill Details Patient Name: Timme, Meleana L. Date of Service: 12/01/2020 Medical Record Number: [id number] Patient Account Number: [id number] Date of Birth/Sex: 10-20-1950 (70 y.o.  F) Treating RN: Yevonne Pax Primary Care Provider: Olevia Perches Other Clinician: Referring Provider: Olevia Perches Treating Provider/Extender: Allen Derry Weeks in Treatment: 0 Diagnosis Coding ICD-10 Codes Code Description B35.4 Tinea corporis L98.492 Non-pressure chronic ulcer of skin of other sites with fat layer exposed G30.8 Other Alzheimer's disease I10 Essential (primary) hypertension Facility Procedures CPT4 Code: 12878676 Description: 99214 - WOUND CARE VISIT-LEV 4 EST PT Modifier: Quantity: 1 Physician Procedures CPT4 Code: 7209470 Description: 99204 - WC PHYS LEVEL 4 - NEW PT Modifier: Quantity: 1 CPT4 Code: Description: ICD-10 Diagnosis Description B35.4 Tinea corporis L98.492 Non-pressure chronic ulcer of skin of other sites with fat layer expos G30.8 Other Alzheimer's disease I10 Essential (primary) hypertension Modifier: ed Quantity: Electronic Signature(s) Signed: 12/01/2020 4:16:47 PM By: Yevonne Pax RN Signed: 12/01/2020 4:24:40 PM By: Lenda Kelp PA-C Previous Signature: 12/01/2020 3:58:49 PM Version By: Lenda Kelp PA-C Previous Signature: 12/01/2020 9:48:19 AM Version By: Lenda Kelp PA-C Entered By: Yevonne Pax on 12/01/2020 16:16:47

## 2020-12-02 NOTE — Progress Notes (Signed)
Mirsky, KIRBY ARGUETA ([id number]) Visit Report for 12/01/2020 Abuse/Suicide Risk Screen Details Patient Name: Gaus, Piper L. Date of Service: 12/01/2020 8:45 AM Medical Record Number: [id number] Patient Account Number: [id number] Date of Birth/Sex: 04/30/1950 (70 y.o. F) Treating RN: Yevonne Pax Primary Care Bandy Honaker: Olevia Perches Other Clinician: Referring Lindsey Demonte: Olevia Perches Treating Brinklee Cisse/Extender: Allen Derry Weeks in Treatment: 0 Abuse/Suicide Risk Screen Items Answer ABUSE RISK SCREEN: Has anyone close to you tried to hurt or harm you recentlyo No Do you feel uncomfortable with anyone in your familyo No Has anyone forced you do things that you didnot want to doo No Electronic Signature(s) Signed: 12/01/2020 6:56:11 PM By: Yevonne Pax RN Entered By: Yevonne Pax on 12/01/2020 09:02:20 Bedoy, [name] ([id number]) -------------------------------------------------------------------------------- Activities of Daily Living Details Patient Name: Rehmann, Oddie L. Date of Service: 12/01/2020 8:45 AM Medical Record Number: [id number] Patient Account Number: [id number] Date of Birth/Sex: [birth year] (70 y.o. F) Treating RN: Yevonne Pax Primary Care Sonna Lipsky: Olevia Perches Other Clinician: Referring Amante Fomby: Olevia Perches Treating Abbee Cremeens/Extender: Rowan Blase in Treatment: 0 Activities of Daily Living Items Answer Activities of Daily Living (Please select one for each item) Drive Automobile Not Able Take Medications Not Able Use Telephone Need Assistance Care for Appearance Not Able Use Toilet Need Assistance Bath / Shower Need Assistance Dress Self Need Assistance Feed Self Completely Able Walk Completely Able Get In / Out Bed Completely Able Housework Not Able Prepare Meals Not Able Handle Money Not Able Shop for Self Not Able Electronic Signature(s) Signed: 12/01/2020 6:56:11 PM By: Yevonne Pax RN Entered By: Yevonne Pax on 12/01/2020  09:04:23 Vantrease, [name] ([id number]) -------------------------------------------------------------------------------- Education Screening Details Patient Name: Blatchford, Kearstyn L. Date of Service: 12/01/2020 8:45 AM Medical Record Number: [id number] Patient Account Number: [id number] Date of Birth/Sex: Jun 28, 1950 (70 y.o. F) Treating RN: Yevonne Pax Primary Care Taniela Feltus: Olevia Perches Other Clinician: Referring Yadriel Kerrigan: Olevia Perches Treating Suriyah Vergara/Extender: Rowan Blase in Treatment: 0 Primary Learner Assessed: Patient Learning Preferences/Education Level/Primary Language Learning Preference: Explanation Highest Education Level: High School Preferred Language: English Cognitive Barrier Language Barrier: No Translator Needed: No Memory Deficit: No Emotional Barrier: No Cultural/Religious Beliefs Affecting Medical Care: No Physical Barrier Impaired Vision: No Impaired Hearing: No Decreased Hand dexterity: No Knowledge/Comprehension Knowledge Level: Medium Comprehension Level: High Ability to understand written instructions: High Ability to understand verbal instructions: High Motivation Anxiety Level: Anxious Cooperation: Cooperative Education Importance: Acknowledges Need Interest in Health Problems: Asks Questions Perception: Coherent Willingness to Engage in Self-Management High Activities: Electronic Signature(s) Signed: 12/01/2020 6:56:11 PM By: Yevonne Pax RN Entered By: Yevonne Pax on 12/01/2020 09:04:47 Kolden, [name] ([id number]) -------------------------------------------------------------------------------- Fall Risk Assessment Details Patient Name: Knightly, Delany L. Date of Service: 12/01/2020 8:45 AM Medical Record Number: [id number] Patient Account Number: [id number] Date of Birth/Sex: 1951-01-14 (70 y.o. F) Treating RN: Yevonne Pax Primary Care Emad Brechtel: Olevia Perches Other Clinician: Referring Thera Basden: Olevia Perches Treating  Eleora Sutherland/Extender: Rowan Blase in Treatment: 0 Fall Risk Assessment Items Have you had 2 or more falls in the last 12 monthso 0 No Have you had any fall that resulted in injury in the last 12 monthso 0 No FALLS RISK SCREEN History of falling - immediate or within 3 months 0 No Secondary diagnosis (Do you have 2 or more medical diagnoseso) 0 No Ambulatory aid None/bed rest/wheelchair/nurse 0 No Crutches/cane/walker 0 No Furniture 0 No Intravenous therapy Access/Saline/Heparin Lock 0 No Gait/Transferring Normal/ bed rest/ wheelchair 0 No Weak (short steps with or without shuffle, stooped but able to lift  head while walking, may 0 No seek support from furniture) Impaired (short steps with shuffle, may have difficulty arising from chair, head down, impaired 0 No balance) Mental Status Oriented to own ability 0 No Electronic Signature(s) Signed: 12/01/2020 6:56:11 PM By: Yevonne Pax RN Entered By: Yevonne Pax on 12/01/2020 09:04:54 Crystal, [name] ([id number]) -------------------------------------------------------------------------------- Foot Assessment Details Patient Name: Plotkin, Lucita L. Date of Service: 12/01/2020 8:45 AM Medical Record Number: [id number] Patient Account Number: [id number] Date of Birth/Sex: [birth year] (70 y.o. F) Treating RN: Yevonne Pax Primary Care Chandrea Zellman: Olevia Perches Other Clinician: Referring Meilani Edmundson: Olevia Perches Treating Abdiel Blackerby/Extender: Allen Derry Weeks in Treatment: 0 Foot Assessment Items Site Locations + = Sensation present, - = Sensation absent, C = Callus, U = Ulcer R = Redness, W = Warmth, M = Maceration, PU = Pre-ulcerative lesion F = Fissure, S = Swelling, D = Dryness Assessment Right: Left: Other Deformity: No No Prior Foot Ulcer: No No Prior Amputation: No No Charcot Joint: No No Ambulatory Status: Ambulatory Without Help Gait: Unsteady Electronic Signature(s) Signed: 12/01/2020 6:56:11 PM By: Yevonne Pax  RN Entered By: Yevonne Pax on 12/01/2020 09:05:20 Moone, [name] ([id number]) -------------------------------------------------------------------------------- Nutrition Risk Screening Details Patient Name: Hoerner, Ashly L. Date of Service: 12/01/2020 8:45 AM Medical Record Number: [id number] Patient Account Number: [id number] Date of Birth/Sex: 1950-12-09 (70 y.o. F) Treating RN: Yevonne Pax Primary Care Nadean Montanaro: Olevia Perches Other Clinician: Referring Caily Rakers: Olevia Perches Treating Keller Bounds/Extender: Allen Derry Weeks in Treatment: 0 Height (in): 64 Weight (lbs): 172 Body Mass Index (BMI): 29.5 Nutrition Risk Screening Items Score Screening NUTRITION RISK SCREEN: I have an illness or condition that made me change the kind and/or amount of food I eat 0 No I eat fewer than two meals per day 0 No I eat few fruits and vegetables, or milk products 0 No I have three or more drinks of beer, liquor or wine almost every day 0 No I have tooth or mouth problems that make it hard for me to eat 0 No I don't always have enough money to buy the food I need 0 No I eat alone most of the time 0 No I take three or more different prescribed or over-the-counter drugs a day 1 Yes Without wanting to, I have lost or gained 10 pounds in the last six months 0 No I am not always physically able to shop, cook and/or feed myself 2 Yes Nutrition Protocols Good Risk Protocol Moderate Risk Protocol 0 Provide education on nutrition High Risk Proctocol Risk Level: Moderate Risk Score: 3 Electronic Signature(s) Signed: 12/01/2020 6:56:11 PM By: Yevonne Pax RN Entered By: Yevonne Pax on 12/01/2020 09:05:11

## 2020-12-12 ENCOUNTER — Other Ambulatory Visit: Payer: Self-pay

## 2020-12-12 ENCOUNTER — Encounter: Payer: Medicare PPO | Attending: Physician Assistant | Admitting: Physician Assistant

## 2020-12-12 DIAGNOSIS — G308 Other Alzheimer's disease: Secondary | ICD-10-CM | POA: Insufficient documentation

## 2020-12-12 DIAGNOSIS — B354 Tinea corporis: Secondary | ICD-10-CM | POA: Insufficient documentation

## 2020-12-12 DIAGNOSIS — F0280 Dementia in other diseases classified elsewhere without behavioral disturbance: Secondary | ICD-10-CM | POA: Diagnosis not present

## 2020-12-12 DIAGNOSIS — L98492 Non-pressure chronic ulcer of skin of other sites with fat layer exposed: Secondary | ICD-10-CM | POA: Diagnosis not present

## 2020-12-12 DIAGNOSIS — I10 Essential (primary) hypertension: Secondary | ICD-10-CM | POA: Diagnosis not present

## 2020-12-12 NOTE — Progress Notes (Addendum)
[name], [name] ([id number]) Visit Report for 12/12/2020 Chief Complaint Document Details Patient Name: [name], [name] Date of Service: 12/12/2020 10:00 AM Medical Record Number: [id number] Patient Account Number: [id number] Date of Birth/Sex: [birth year] (70 y.o. F) Treating RN: Huel Coventry Primary Care Provider: Olevia Perches Other Clinician: Referring Provider: Olevia Perches Treating Provider/Extender: Allen Derry Weeks in Treatment: 1 Information Obtained from: Patient Chief Complaint Midline abdominal ulcer Electronic Signature(s) Signed: 12/12/2020 10:06:44 AM By: Lenda Kelp PA-C Entered By: Lenda Kelp on 12/12/2020 10:06:44 [name] [name] ([id number]) -------------------------------------------------------------------------------- HPI Details Patient Name: [name], [name] Date of Service: 12/12/2020 10:00 AM Medical Record Number: [id number] Patient Account Number: [id number] Date of Birth/Sex: May 08, 1950 (70 y.o. F) Treating RN: Huel Coventry Primary Care Provider: Olevia Perches Other Clinician: Referring Provider: Olevia Perches Treating Provider/Extender: Allen Derry Weeks in Treatment: 1 History of Present Illness HPI Description: 12/01/2020 upon evaluation today patient presents for initial inspection here in the clinic concerning a wound over the midline lower abdominal region which is underneath her pannus. Subsequently she has been using Lotrisone and nystatin powder in general over the area but then subsequently had a region here in particular where she had a larger area of skin breakdown I think this is simply due to the skin contact to the opposite side skin and moisture trapping underneath. It does have a very distinctive ringworm type appearance with a central clearing and erythematous border around and then clear outside. She has a history of Alzheimer's and hypertension but otherwise no major medical problems. 12/12/2020 upon evaluation today patient  appears to be doing a little bit more poorly in regard to her wound. There is some evidence of bruising where the tape from the dressing seems to be sticking to her skin causing some trouble here. I think we may want to switch things up from a dressing standpoint to some degree here. Electronic Signature(s) Signed: 12/12/2020 3:57:33 PM By: Lenda Kelp PA-C Entered By: Lenda Kelp on 12/12/2020 15:57:32 [name] [name] ([id number]) -------------------------------------------------------------------------------- Physical Exam Details Patient Name: [name], [name] Date of Service: 12/12/2020 10:00 AM Medical Record Number: [id number] Patient Account Number: [id number] Date of Birth/Sex: Dec 29, 1950 (70 y.o. F) Treating RN: Huel Coventry Primary Care Provider: Olevia Perches Other Clinician: Referring Provider: Olevia Perches Treating Provider/Extender: Allen Derry Weeks in Treatment: 1 Constitutional Well-nourished and well-hydrated in no acute distress. Respiratory normal breathing without difficulty. Psychiatric this patient is able to make decisions and demonstrates good insight into disease process. Alert and Oriented x 3. pleasant and cooperative. Notes Patient's wound again showed signs of still some irritation and I am unfortunately not as pleased as I would like to be with the way this wound looks. I do believe that we may want to switch to just using the nystatin powder followed by alginate and then a Tegaderm to secure in place. Electronic Signature(s) Signed: 12/12/2020 3:57:51 PM By: Lenda Kelp PA-C Entered By: Lenda Kelp on 12/12/2020 15:57:51 [name] [name] ([id number]) -------------------------------------------------------------------------------- Physician Orders Details Patient Name: [name], [name] Date of Service: 12/12/2020 10:00 AM Medical Record Number: [id number] Patient Account Number: [id number] Date of Birth/Sex: Jun 24, 1950 (70 y.o.  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 Bathing/ Shower/ Hygiene o May shower; gently cleanse wound with antibacterial soap, rinse and pat dry prior to dressing wounds Wound Treatment Wound #1 - Abdomen - midline Topical: Nystop Nystatin Powder, 15 (g) bottle 1 x Per Day/30 Days Primary Dressing: Silvercel Small 2x2 (in/in) 1 x Per Day/30 Days Discharge Instructions: Apply Silvercel Small 2x2 (in/in) as instructed Secured With: Tegaderm Film 4x4 (in/in) 1 x Per Day/30 Days Discharge Instructions: Apply to wound bed Electronic Signature(s) Signed: 12/12/2020 10:58:55 AM By: Elliot Gurney, BSN, RN, CWS, Kim RN, BSN Signed: 12/12/2020 4:22:43 PM By: Lenda Kelp PA-C Entered By: Elliot Gurney, BSN, RN, CWS, Kim on 12/12/2020 10:58:55 [name] [name] ([id number]) -------------------------------------------------------------------------------- Problem List Details Patient Name: [name], [name] Date of Service: 12/12/2020 10:00 AM Medical Record Number: [id number] Patient Account Number: [id number] Date of Birth/Sex: July 17, 1950 (70 y.o. F) Treating RN: Huel Coventry Primary Care Provider: Olevia Perches Other Clinician: Referring Provider: Olevia Perches Treating Provider/Extender: Allen Derry Weeks in Treatment: 1 Active Problems ICD-10 Encounter Code Description Active Date MDM Diagnosis B35.4 Tinea corporis 12/01/2020 No Yes L98.492 Non-pressure chronic ulcer of skin of other sites with fat layer exposed 12/01/2020 No Yes G30.8 Other Alzheimer's disease  12/01/2020 No Yes I10 Essential (primary) hypertension 12/01/2020 No Yes Inactive Problems Resolved Problems Electronic Signature(s) Signed: 12/12/2020 10:06:35 AM By: Lenda Kelp PA-C Entered By: Lenda Kelp on 12/12/2020 10:06:35 Lockridge, Zorita [name] ([id number]) -------------------------------------------------------------------------------- Progress Note Details Patient Name: [name], [name] Date of Service: 12/12/2020 10:00 AM Medical Record Number: [id number] Patient Account Number: [id number] Date of Birth/Sex: [birth year] (70 y.o. F) Treating RN: Huel Coventry Primary Care Provider: Olevia Perches Other Clinician: Referring Provider: Olevia Perches Treating Provider/Extender: Rowan Blase in Treatment: 1 Subjective Chief Complaint Information obtained from Patient Midline abdominal ulcer History of Present Illness (HPI) 12/01/2020 upon evaluation today patient presents for initial inspection here in the clinic concerning a wound over the midline lower abdominal region which is underneath her pannus. Subsequently she has been using Lotrisone and nystatin powder in general over the area but then subsequently had a region here in particular where she had a larger area of skin breakdown I think this is simply due to the skin contact to the opposite side skin and moisture trapping underneath. It does have a very distinctive ringworm type appearance with a central clearing and erythematous border around and then clear outside. She has a history of Alzheimer's and hypertension but otherwise no major medical problems. 12/12/2020 upon evaluation today patient appears to be doing a little bit more poorly in regard to her wound. There is some evidence of bruising where the tape from the dressing seems to be sticking to her skin causing some trouble here. I think we may want to switch things up from a dressing standpoint to some degree here. Objective Constitutional Well-nourished  and well-hydrated in no acute distress. Vitals Time Taken: 10:23 AM, Height: 64 in, Weight: 172 lbs, BMI: 29.5, Temperature: 98.4 F, Pulse: 74 bpm, Respiratory Rate: 16 breaths/min, Blood Pressure: 130/83 mmHg. Respiratory normal breathing without difficulty. Psychiatric this patient is able to make decisions and demonstrates good insight into disease process. Alert and Oriented x 3. pleasant and cooperative. General Notes: Patient's wound again showed signs of still some irritation and I am unfortunately not as pleased as I would like to be with the way this wound looks. I do believe that we may want to switch to just using the  nystatin powder followed by alginate and then a Tegaderm to secure in place. Integumentary (Hair, Skin) Wound #1 status is Open. Original cause of wound was Gradually Appeared. The date acquired was: 09/01/2020. The wound has been in treatment 1 weeks. The wound is located on the Abdomen - midline. The wound measures 1.8cm length x 1.6cm width x 0.2cm depth; 2.262cm^2 area and 0.452cm^3 volume. There is Fat Layer (Subcutaneous Tissue) exposed. There is no tunneling or undermining noted. There is a medium amount of serosanguineous drainage noted. There is small (1-33%) red granulation within the wound bed. There is a large (67-100%) amount of necrotic tissue within the wound bed including Adherent Slough. 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 ([id number]) Plan Follow-up Appointments: Return Appointment in 1 week. Bathing/ Shower/ Hygiene: May shower; gently cleanse wound with antibacterial soap, rinse and pat dry prior to dressing wounds WOUND #1: - Abdomen - midline Wound Laterality: Topical: Nystop Nystatin Powder, 15 (g) bottle 1 x Per Day/30 Days Primary Dressing: Silvercel Small 2x2 (in/in) 1 x Per  Day/30 Days Discharge Instructions: Apply Silvercel Small 2x2 (in/in) as instructed Secured With: Tegaderm Film 4x4 (in/in) 1 x Per Day/30 Days Discharge Instructions: Apply to wound bed 1. I would recommend currently that again we continue with the silver alginate just using the nystatin powder but avoid the Lotrisone cream. 2. Also can recommend that we have the patient go ahead and get some of the Tegaderm film to secure in place I think this would be the best way to go. We will see patient back for reevaluation in 1 week here in the clinic. If anything worsens or changes patient will contact our office for additional recommendations. Electronic Signature(s) Signed: 12/12/2020 3:58:17 PM By: Lenda Kelp PA-C Entered By: Lenda Kelp on 12/12/2020 15:58:17 Starnes, Zorita [name] ([id number]) -------------------------------------------------------------------------------- SuperBill Details Patient Name: [name], [name] Date of Service: 12/12/2020 Medical Record Number: [id number] Patient Account Number: [id number] Date of Birth/Sex: 1950-06-28 (70 y.o. F) Treating RN: Huel Coventry Primary Care Provider: Olevia Perches Other Clinician: Referring Provider: Olevia Perches Treating Provider/Extender: Allen Derry Weeks in Treatment: 1 Diagnosis Coding ICD-10 Codes Code Description B35.4 Tinea corporis L98.492 Non-pressure chronic ulcer of skin of other sites with fat layer exposed G30.8 Other Alzheimer's disease I10 Essential (primary) hypertension Facility Procedures CPT4 Code: [id number] Description: 99213 - WOUND CARE VISIT-LEV 3 EST PT Modifier: Quantity: 1 Physician Procedures CPT4 Code: [id number] Description: 99214 - WC PHYS LEVEL 4 - EST PT Modifier: Quantity: 1 CPT4 Code: Description: ICD-10 Diagnosis Description B35.4 Tinea corporis L98.492 Non-pressure chronic ulcer of skin of other sites with fat layer expos G30.8 Other Alzheimer's disease I10 Essential (primary)  hypertension Modifier: ed Quantity: Electronic Signature(s) Signed: 12/12/2020 3:58:52 PM By: Lenda Kelp PA-C Previous Signature: 12/12/2020 10:59:57 AM Version By: Elliot Gurney, BSN, RN, CWS, Kim RN, BSN Entered By: Lenda Kelp on 12/12/2020 15:58:52

## 2020-12-12 NOTE — Progress Notes (Addendum)
TITIANNA, LOOMIS ([id number]) Visit Report for 12/12/2020 Arrival Information Details Patient Name: [name] [name] SHIFFLETT. Date of Service: 12/12/2020 10:00 AM Medical Record Number: [id number] Patient Account Number: [id number] Date of Birth/Sex: 06-09-1950 (70 y.o. F) Treating RN: Huel Coventry Primary Care Jaime Dome: Olevia Perches Other Clinician: Referring Aylen Stradford: Olevia Perches Treating Takirah Binford/Extender: Rowan Blase in Treatment: 1 Visit Information History Since Last Visit Added or deleted any medications: No Patient Arrived: Ambulatory Has Dressing in Place as Prescribed: No Arrival Time: 10:20 Pain Present Now: Unable to Respond Accompanied By: Peyton Najjar, Husband Transfer Assistance: None Patient Identification Verified: Yes Secondary Verification Process Completed: Yes Patient Requires Transmission-Based Precautions: No Patient Has Alerts: No Electronic Signature(s) Signed: 12/12/2020 2:15:45 PM By: Elliot Gurney, BSN, RN, CWS, Kim RN, BSN Entered By: Elliot Gurney, BSN, RN, CWS, Kim on 12/12/2020 10:23:04 [name] [name] ([id number]) -------------------------------------------------------------------------------- Clinic Level of Care Assessment Details Patient Name: [name] [name] L. Date of Service: 12/12/2020 10:00 AM Medical Record Number: [id number] Patient Account Number: [id number] Date of Birth/Sex: 1951/01/26 (70 y.o. F) Treating RN: Huel Coventry Primary Care Eusebia Grulke: Olevia Perches Other Clinician: Referring Symia Herdt: Olevia Perches Treating Joenathan Sakuma/Extender: Rowan Blase in Treatment: 1 Clinic Level of Care Assessment Items TOOL 4 Quantity Score []  - Use when only an EandM is performed on FOLLOW-UP visit 0 ASSESSMENTS - Nursing Assessment / Reassessment X - Reassessment of Co-morbidities (includes updates in patient status) 1 10 X- 1 5 Reassessment of Adherence to Treatment Plan ASSESSMENTS - Wound and Skin Assessment / Reassessment X - Simple Wound Assessment /  Reassessment - one wound 1 5 []  - 0 Complex Wound Assessment / Reassessment - multiple wounds []  - 0 Dermatologic / Skin Assessment (not related to wound area) ASSESSMENTS - Focused Assessment []  - Circumferential Edema Measurements - multi extremities 0 []  - 0 Nutritional Assessment / Counseling / Intervention []  - 0 Lower Extremity Assessment (monofilament, tuning fork, pulses) []  - 0 Peripheral Arterial Disease Assessment (using hand held doppler) ASSESSMENTS - Ostomy and/or Continence Assessment and Care []  - Incontinence Assessment and Management 0 []  - 0 Ostomy Care Assessment and Management (repouching, etc.) PROCESS - Coordination of Care X - Simple Patient / Family Education for ongoing care 1 15 []  - 0 Complex (extensive) Patient / Family Education for ongoing care []  - 0 Staff obtains , Records, Test Results / Process Orders []  - 0 Staff telephones HHA, Nursing Homes / Clarify orders / etc []  - 0 Routine Transfer to another Facility (non-emergent condition) []  - 0 Routine Hospital Admission (non-emergent condition) []  - 0 New Admissions / / Ordering NPWT, Apligraf, etc. []  - 0 Emergency Hospital Admission (emergent condition) X- 1 10 Simple Discharge Coordination []  - 0 Complex (extensive) Discharge Coordination PROCESS - Special Needs []  - Pediatric / Minor Patient Management 0 []  - 0 Isolation Patient Management []  - 0 Hearing / Language / Visual special needs []  - 0 Assessment of Community assistance (transportation, D/C planning, etc.) []  - 0 Additional assistance / Altered mentation []  - 0 Support Surface(s) Assessment (bed, cushion, seat, etc.) INTERVENTIONS - Wound Cleansing / Measurement [name] [name] [name] L. ( ) X- 1 5 Simple Wound Cleansing - one wound []  - 0 Complex Wound Cleansing - multiple wounds X- 1 5 Wound Imaging (photographs - any number of wounds) []  - 0 Wound Tracing (instead of  photographs) X- 1 5 Simple Wound Measurement - one wound []  - 0 Complex Wound Measurement - multiple wounds INTERVENTIONS - Wound Dressings []  - Small Wound Dressing  one or multiple wounds 0 X- 1 15 Medium Wound Dressing one or multiple wounds []  - 0 Large Wound Dressing one or multiple wounds []  - 0 Application of Medications - topical []  - 0 Application of Medications - injection INTERVENTIONS - Miscellaneous []  - External ear exam 0 []  - 0 Specimen Collection (cultures, biopsies, blood, body fluids, etc.) []  - 0 Specimen(s) / Culture(s) sent or taken to Lab for analysis []  - 0 Patient Transfer (multiple staff / / Similar devices) []  - 0 Simple Staple / Suture removal (25 or less) []  - 0 Complex Staple / Suture removal (26 or more) []  - 0 Hypo / Hyperglycemic Management (close monitor of Blood Glucose) []  - 0 Ankle / Brachial Index (ABI) - do not check if billed separately X- 1 5 Vital Signs Has the patient been seen at the hospital within the last three years: Yes Total Score: 80 Level Of Care: New/Established - Level 3 Electronic Signature(s) Signed: 12/12/2020 2:15:45 PM By: , BSN, RN, CWS, Kim RN, BSN Entered By: , BSN, RN, CWS, Kim on 12/12/2020 10:59:35 [name], ( ) -------------------------------------------------------------------------------- Lower Extremity Assessment Details Patient Name: [name] [name] [name] L. Date of Service: 12/12/2020 10:00 AM Medical Record Number: Patient Account Number: Date of Birth/Sex: [birth year] (70 y.o. F) Treating RN: 13/12/2020 Primary Care Afton Mikelson: Elliot Gurney Other Clinician: Referring Madine Sarr: Elliot Gurney Treating Glori Machnik/Extender: 13/12/2020 in Treatment: 1 Electronic Signature(s) Signed: 12/12/2020 2:15:45 PM By: [id number], BSN, RN, CWS, Kim RN, BSN Entered By: 13/12/2020, BSN, RN, CWS, Kim on 12/12/2020 10:29:40 [name], [id number]  (06/21/1950) -------------------------------------------------------------------------------- Multi Wound Chart Details Patient Name: [name], [name] L. Date of Service: 12/12/2020 10:00 AM Medical Record Number: Huel Coventry Patient Account Number: Olevia Perches Date of Birth/Sex: 07-Oct-1950 (70 y.o. F) Treating RN: 13/12/2020 Primary Care Mckoy Bhakta: Elliot Gurney Other Clinician: Referring Blue Winther: Elliot Gurney Treating Shayma Pfefferle/Extender: 13/12/2020 Weeks in Treatment: 1 Vital Signs Height(in): 64 Pulse(bpm): 74 Weight(lbs): 172 Blood Pressure(mmHg): 130/83 Body Mass Index(BMI): 30 Temperature(F): 98.4 Respiratory Rate(breaths/min): 16 Photos: [N/A:N/A] Wound Location: Abdomen - midline N/A N/A Wounding Event: Gradually Appeared N/A N/A Primary Etiology: Fungal N/A N/A Comorbid History: Hypertension N/A N/A Date Acquired: 09/01/2020 N/A N/A Weeks of Treatment: 1 N/A N/A Wound Status: Open N/A N/A Measurements L x W x D (cm) 1.8x1.6x0.2 N/A N/A Area (cm) : 2.262 N/A N/A Volume (cm) : 0.452 N/A N/A % Reduction in Area: -88.20% N/A N/A % Reduction in Volume: -88.30% N/A N/A Classification: Full Thickness Without Exposed N/A N/A Support Structures Exudate Amount: Medium N/A N/A Exudate Type: Serosanguineous N/A N/A Exudate Color: red, brown N/A N/A Granulation Amount: Small (1-33%) N/A N/A Granulation Quality: Red N/A N/A Necrotic Amount: Large (67-100%) N/A N/A Exposed Structures: Fat Layer (Subcutaneous Tissue): N/A N/A Yes Fascia: No Tendon: No Muscle: No Joint: No Bone: No Epithelialization: None N/A N/A Treatment Notes Electronic Signature(s) Signed: 12/12/2020 10:56:38 AM By: 13/12/2020, BSN, RN, CWS, Kim RN, BSN Entered By: [id number], BSN, RN, CWS, Kim on 12/12/2020 10:56:37 Sferrazza, 06/21/1950 ((74) -------------------------------------------------------------------------------- Multi-Disciplinary Care Plan Details Patient Name: [name] [name] L. Date of Service:  12/12/2020 10:00 AM Medical Record Number: Olevia Perches Patient Account Number: Olevia Perches Date of Birth/Sex: [birth year] (70 y.o. F) Treating RN: 13/12/2020 Primary Care Aslyn Cottman: Elliot Gurney Other Clinician: Referring Ramya Vanbergen: Elliot Gurney Treating Zerek Litsey/Extender: 13/12/2020 in Treatment: 1 Active Inactive Nutrition Nursing Diagnoses: Potential for alteratiion in Nutrition/Potential for imbalanced nutrition Goals: Patient/caregiver agrees to and verbalizes understanding of need to  use nutritional supplements and/or vitamins as prescribed Date Initiated: 12/01/2020 Target Resolution Date: 12/31/2020 Goal Status: Active Interventions: Assess patient nutrition upon admission and as needed per policy Notes: Wound/Skin Impairment Nursing Diagnoses: Knowledge deficit related to ulceration/compromised skin integrity Goals: Patient will have a decrease in wound volume by X% from date: (specify in notes) Date Initiated: 12/01/2020 Target Resolution Date: 12/31/2020 Goal Status: Active Patient/caregiver will verbalize understanding of skin care regimen Date Initiated: 12/01/2020 Target Resolution Date: 01/30/2021 Goal Status: Active Ulcer/skin breakdown will have a volume reduction of 30% by week 4 Date Initiated: 12/01/2020 Target Resolution Date: 03/02/2021 Goal Status: Active Ulcer/skin breakdown will have a volume reduction of 50% by week 8 Date Initiated: 12/01/2020 Target Resolution Date: 03/31/2021 Goal Status: Active Ulcer/skin breakdown will have a volume reduction of 80% by week 12 Date Initiated: 12/01/2020 Target Resolution Date: 04/28/2021 Goal Status: Active Ulcer/skin breakdown will heal within 14 weeks Date Initiated: 12/01/2020 Target Resolution Date: 05/29/2021 Goal Status: Active Interventions: Assess patient/caregiver ability to obtain necessary supplies Assess patient/caregiver ability to perform ulcer/skin care regimen upon admission and as  needed Assess ulceration(s) every visit Notes: Electronic Signature(s) Signed: 12/12/2020 2:15:45 PM By: Elliot Gurney, BSN, RN, CWS, Kim RN, BSN Entered By: Elliot Gurney, BSN, RN, CWS, Kim on 12/12/2020 10:30:01 Odonell, [name] [name] ([id number]) -------------------------------------------------------------------------------- Pain Assessment Details Patient Name: [name] [name] L. Date of Service: 12/12/2020 10:00 AM Medical Record Number: [id number] Patient Account Number: [id number] Date of Birth/Sex: Nov 16, 1950 (70 y.o. F) Treating RN: Huel Coventry Primary Care Meral Geissinger: Olevia Perches Other Clinician: Referring Tabita Corbo: Olevia Perches Treating Kinte Trim/Extender: Allen Derry Weeks in Treatment: 1 Active Problems Location of Pain Severity and Description of Pain Patient Has Paino Patient Unable to Respond Site Locations Pain Management and Medication Current Pain Management: Notes Patient is confused and unable to respond. Electronic Signature(s) Signed: 12/12/2020 2:15:45 PM By: Elliot Gurney, BSN, RN, CWS, Kim RN, BSN Entered By: Elliot Gurney, BSN, RN, CWS, Kim on 12/12/2020 10:24:01 Tickner, [name] [name] ([id number]) -------------------------------------------------------------------------------- Patient/Caregiver Education Details Patient Name: [name] [name] L. Date of Service: 12/12/2020 10:00 AM Medical Record Number: [id number] Patient Account Number: [id number] Date of Birth/Gender: [birth year] (70 y.o. F) Treating RN: Huel Coventry Primary Care Physician: Olevia Perches Other Clinician: Referring Physician: Olevia Perches Treating Physician/Extender: Rowan Blase in Treatment: 1 Education Assessment Education Provided To: Patient Education Topics Provided Wound/Skin Impairment: Handouts: Caring for Your Ulcer, Other: wound care as prescribed Methods: Demonstration, Explain/Verbal Responses: State content correctly Electronic Signature(s) Signed: 12/12/2020 2:15:45 PM By: Elliot Gurney, BSN, RN, CWS, Kim RN,  BSN Entered By: Elliot Gurney, BSN, RN, CWS, Kim on 12/12/2020 11:00:24 Louischarles, [name] [name] ([id number]) -------------------------------------------------------------------------------- Wound Assessment Details Patient Name: [name] [name] L. Date of Service: 12/12/2020 10:00 AM Medical Record Number: [id number] Patient Account Number: [id number] Date of Birth/Sex: 16-Dec-1950 (70 y.o. F) Treating RN: Huel Coventry Primary Care Lliam Hoh: Olevia Perches Other Clinician: Referring Nathian Stencil: Olevia Perches Treating Woodruff Skirvin/Extender: Allen Derry Weeks in Treatment: 1 Wound Status Wound Number: 1 Primary Etiology: Fungal Wound Location: Abdomen - midline Wound Status: Open Wounding Event: Gradually Appeared Comorbid History: Hypertension Date Acquired: 09/01/2020 Weeks Of Treatment: 1 Clustered Wound: No Photos Wound Measurements Length: (cm) 1.8 Width: (cm) 1.6 Depth: (cm) 0.2 Area: (cm) 2.262 Volume: (cm) 0.452 % Reduction in Area: -88.2% % Reduction in Volume: -88.3% Epithelialization: None Tunneling: No Undermining: No Wound Description Classification: Full Thickness Without Exposed Support Structu Exudate Amount: Medium Exudate Type: Serosanguineous Exudate Color: red, brown res Foul Odor After Cleansing: No Slough/Fibrino Yes Wound Bed Granulation Amount: Small (1-33%)  Exposed Structure Granulation Quality: Red Fascia Exposed: No Necrotic Amount: Large (67-100%) Fat Layer (Subcutaneous Tissue) Exposed: Yes Necrotic Quality: Adherent Slough Tendon Exposed: No Muscle Exposed: No Joint Exposed: No Bone Exposed: No Electronic Signature(s) Signed: 12/12/2020 2:15:45 PM By: Elliot Gurney, BSN, RN, CWS, Kim RN, BSN Entered By: Elliot Gurney, BSN, RN, CWS, Kim on 12/12/2020 10:29:20 Herrman, [name] [name] ([id number]) -------------------------------------------------------------------------------- Vitals Details Patient Name: [name] [name] L. Date of Service: 12/12/2020 10:00 AM Medical Record Number:  [id number] Patient Account Number: [id number] Date of Birth/Sex: 03/24/1950 (70 y.o. F) Treating RN: Huel Coventry Primary Care Maybell Misenheimer: Olevia Perches Other Clinician: Referring Mikayah Joy: Olevia Perches Treating Kaniah Rizzolo/Extender: Allen Derry Weeks in Treatment: 1 Vital Signs Time Taken: 10:23 Temperature (F): 98.4 Height (in): 64 Pulse (bpm): 74 Weight (lbs): 172 Respiratory Rate (breaths/min): 16 Body Mass Index (BMI): 29.5 Blood Pressure (mmHg): 130/83 Reference Range: 80 - 120 mg / dl Electronic Signature(s) Signed: 12/12/2020 2:15:45 PM By: Elliot Gurney, BSN, RN, CWS, Kim RN, BSN Entered By: Elliot Gurney, BSN, RN, CWS, Kim on 12/12/2020 10:23:31

## 2020-12-19 ENCOUNTER — Other Ambulatory Visit: Payer: Self-pay

## 2020-12-19 ENCOUNTER — Encounter: Payer: Medicare PPO | Admitting: Physician Assistant

## 2020-12-19 DIAGNOSIS — L98492 Non-pressure chronic ulcer of skin of other sites with fat layer exposed: Secondary | ICD-10-CM | POA: Diagnosis not present

## 2020-12-19 DIAGNOSIS — G308 Other Alzheimer's disease: Secondary | ICD-10-CM | POA: Diagnosis not present

## 2020-12-19 DIAGNOSIS — F0280 Dementia in other diseases classified elsewhere without behavioral disturbance: Secondary | ICD-10-CM | POA: Diagnosis not present

## 2020-12-19 DIAGNOSIS — I10 Essential (primary) hypertension: Secondary | ICD-10-CM | POA: Diagnosis not present

## 2020-12-19 DIAGNOSIS — B354 Tinea corporis: Secondary | ICD-10-CM | POA: Diagnosis not present

## 2020-12-19 NOTE — Progress Notes (Addendum)
[name], [name] ([id number]) Visit Report for 12/19/2020 Chief Complaint Document Details Patient Name: [name], [name] Date of Service: 12/19/2020 8:15 AM Medical Record Number: [id number] Patient Account Number: [id number] Date of Birth/Sex: 1950/05/10 (70 y.o. F) Treating RN: Huel Coventry Primary Care Provider: Olevia Perches Other Clinician: Referring Provider: Olevia Perches Treating Provider/Extender: Allen Derry Weeks in Treatment: 2 Information Obtained from: Patient Chief Complaint Midline abdominal ulcer Electronic Signature(s) Signed: 12/19/2020 8:31:25 AM By: Lenda Kelp PA-C Entered By: Lenda Kelp on 12/19/2020 08:31:24 [name] [name] ([id number]) -------------------------------------------------------------------------------- HPI Details Patient Name: [name], [name] Date of Service: 12/19/2020 8:15 AM Medical Record Number: [id number] Patient Account Number: [id number] Date of Birth/Sex: 04/07/1950 (70 y.o. F) Treating RN: Huel Coventry Primary Care Provider: Olevia Perches Other Clinician: Referring Provider: Olevia Perches Treating Provider/Extender: Allen Derry Weeks in Treatment: 2 History of Present Illness HPI Description: 12/01/2020 upon evaluation today patient presents for initial inspection here in the clinic concerning a wound over the midline lower abdominal region which is underneath her pannus. Subsequently she has been using Lotrisone and nystatin powder in general over the area but then subsequently had a region here in particular where she had a larger area of skin breakdown I think this is simply due to the skin contact to the opposite side skin and moisture trapping underneath. It does have a very distinctive ringworm type appearance with a central clearing and erythematous border around and then clear outside. She has a history of Alzheimer's and hypertension but otherwise no major medical problems. 12/12/2020 upon evaluation today patient  appears to be doing a little bit more poorly in regard to her wound. There is some evidence of bruising where the tape from the dressing seems to be sticking to her skin causing some trouble here. I think we may want to switch things up from a dressing standpoint to some degree here. 12/19/2020 upon evaluation today patient's wound is actually showing signs of significant improvement. I am actually extremely pleased with where we stand this week. The patient is likewise very happy with where we are. There is no signs of active infection which is also great news. No fevers, chills, nausea, vomiting, or diarrhea. Electronic Signature(s) Signed: 12/19/2020 5:37:53 PM By: Lenda Kelp PA-C Entered By: Lenda Kelp on 12/19/2020 17:37:53 [name] [name] ([id number]) -------------------------------------------------------------------------------- Physical Exam Details Patient Name: [name], [name] Date of Service: 12/19/2020 8:15 AM Medical Record Number: [id number] Patient Account Number: [id number] Date of Birth/Sex: [birth year] (70 y.o. F) Treating RN: Huel Coventry Primary Care Provider: Olevia Perches Other Clinician: Referring Provider: Olevia Perches Treating Provider/Extender: Allen Derry Weeks in Treatment: 2 Constitutional Well-nourished and well-hydrated in no acute distress. Respiratory normal breathing without difficulty. Psychiatric this patient is able to make decisions and demonstrates good insight into disease process. Alert and Oriented x 3. pleasant and cooperative. Notes Patient's wound bed showed signs of good granulation and epithelization this is significantly improved compared to what we have seen previous and I am very happy with how things appear currently. There is no evidence of infection locally nor systemically. Electronic Signature(s) Signed: 12/19/2020 5:38:20 PM By: Lenda Kelp PA-C Entered By: Lenda Kelp on 12/19/2020 17:38:19 [name] [name]  ([id number]) -------------------------------------------------------------------------------- Physician Orders Details Patient Name: [name], [name] Date of Service: 12/19/2020 8:15 AM Medical Record Number: [id number] Patient Account Number: [id number] Date of Birth/Sex: 1950/06/04 (70 y.o. F) Treating RN: Huel Coventry Primary Care Provider: Olevia Perches Other Clinician: Referring Provider: Olevia Perches Treating  Provider/Extender: Allen Derry Weeks in Treatment: 2 Verbal / Phone Orders: No Diagnosis Coding ICD-10 Coding Code Description B35.4 Tinea corporis L98.492 Non-pressure chronic ulcer of skin of other sites with fat layer exposed G30.8 Other Alzheimer's disease I10 Essential (primary) hypertension Follow-up Appointments o Return Appointment in 2 weeks. Bathing/ Shower/ Hygiene o May shower; gently cleanse wound with antibacterial soap, rinse and pat dry prior to dressing wounds Wound Treatment Wound #1 - Abdomen - midline Primary Dressing: Silvercel Small 2x2 (in/in) 1 x Per Day/30 Days Discharge Instructions: Apply Silvercel Small 2x2 (in/in) as instructed Secured With: Tegaderm Film 4x4 (in/in) 1 x Per Day/30 Days Discharge Instructions: Apply to wound bed Electronic Signature(s) Signed: 12/19/2020 3:56:18 PM By: Elliot Gurney, BSN, RN, CWS, Kim RN, BSN Signed: 12/19/2020 5:42:25 PM By: Lenda Kelp PA-C Entered By: Elliot Gurney, BSN, RN, CWS, Kim on 12/19/2020 08:40:00 [name] [name] ([id number]) -------------------------------------------------------------------------------- Problem List Details Patient Name: [name], [name] Date of Service: 12/19/2020 8:15 AM Medical Record Number: [id number] Patient Account Number: [id number] Date of Birth/Sex: Dec 28, 1950 (70 y.o. F) Treating RN: Huel Coventry Primary Care Provider: Olevia Perches Other Clinician: Referring Provider: Olevia Perches Treating Provider/Extender: Allen Derry Weeks in Treatment: 2 Active  Problems ICD-10 Encounter Code Description Active Date MDM Diagnosis B35.4 Tinea corporis 12/01/2020 No Yes L98.492 Non-pressure chronic ulcer of skin of other sites with fat layer exposed 12/01/2020 No Yes G30.8 Other Alzheimer's disease 12/01/2020 No Yes I10 Essential (primary) hypertension 12/01/2020 No Yes Inactive Problems Resolved Problems Electronic Signature(s) Signed: 12/19/2020 8:31:19 AM By: Lenda Kelp PA-C Entered By: Lenda Kelp on 12/19/2020 08:31:19 Risby, Zorita [name] ([id number]) -------------------------------------------------------------------------------- Progress Note Details Patient Name: [name], [name] Date of Service: 12/19/2020 8:15 AM Medical Record Number: [id number] Patient Account Number: [id number] Date of Birth/Sex: 10-09-1950 (70 y.o. F) Treating RN: Huel Coventry Primary Care Provider: Olevia Perches Other Clinician: Referring Provider: Olevia Perches Treating Provider/Extender: Rowan Blase in Treatment: 2 Subjective Chief Complaint Information obtained from Patient Midline abdominal ulcer History of Present Illness (HPI) 12/01/2020 upon evaluation today patient presents for initial inspection here in the clinic concerning a wound over the midline lower abdominal region which is underneath her pannus. Subsequently she has been using Lotrisone and nystatin powder in general over the area but then subsequently had a region here in particular where she had a larger area of skin breakdown I think this is simply due to the skin contact to the opposite side skin and moisture trapping underneath. It does have a very distinctive ringworm type appearance with a central clearing and erythematous border around and then clear outside. She has a history of Alzheimer's and hypertension but otherwise no major medical problems. 12/12/2020 upon evaluation today patient appears to be doing a little bit more poorly in regard to her wound. There is some  evidence of bruising where the tape from the dressing seems to be sticking to her skin causing some trouble here. I think we may want to switch things up from a dressing standpoint to some degree here. 12/19/2020 upon evaluation today patient's wound is actually showing signs of significant improvement. I am actually extremely pleased with where we stand this week. The patient is likewise very happy with where we are. There is no signs of active infection which is also great news. No fevers, chills, nausea, vomiting, or diarrhea. Objective Constitutional Well-nourished and well-hydrated in no acute distress. Vitals Time Taken: 4:20 PM, Height: 64 in, Weight: 172 lbs, BMI: 29.5, Temperature: 98.2 F, Pulse: 80  bpm, Respiratory Rate: 16 breaths/min, Blood Pressure: 134/82 mmHg. Respiratory normal breathing without difficulty. Psychiatric this patient is able to make decisions and demonstrates good insight into disease process. Alert and Oriented x 3. pleasant and cooperative. General Notes: Patient's wound bed showed signs of good granulation and epithelization this is significantly improved compared to what we have seen previous and I am very happy with how things appear currently. There is no evidence of infection locally nor systemically. Integumentary (Hair, Skin) Wound #1 status is Open. Original cause of wound was Gradually Appeared. The date acquired was: 09/01/2020. The wound has been in treatment 2 weeks. The wound is located on the Abdomen - midline. The wound measures 1.5cm length x 1.5cm width x 0.1cm depth; 1.767cm^2 area and 0.177cm^3 volume. There is Fat Layer (Subcutaneous Tissue) exposed. There is no tunneling or undermining noted. There is a medium amount of serosanguineous drainage noted. There is medium (34-66%) red granulation within the wound bed. There is a small (1-33%) amount of necrotic tissue within the wound bed including Adherent Slough. Assessment Active  Problems ICD-10 Tinea corporis [name], [name] 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 Bathing/ Shower/ Hygiene: May shower; gently cleanse wound with antibacterial soap, rinse and pat dry prior to dressing wounds WOUND #1: - Abdomen - midline Wound Laterality: Primary Dressing: Silvercel Small 2x2 (in/in) 1 x Per Day/30 Days Discharge Instructions: Apply Silvercel Small 2x2 (in/in) as instructed Secured With: Tegaderm Film 4x4 (in/in) 1 x Per Day/30 Days Discharge Instructions: Apply to wound bed 1. Would recommend currently that we go ahead and continue with the wound care measures as before with using Tegaderm to secure the dressing in place were using silver cell over the wound which has done a great job her husband is taking excellent care of her. 2. I am also can recommend they continue to monitor for any signs of worsening or infection if anything changes he should let me know. We will see patient back for reevaluation in 2 weeks here in the clinic. If anything worsens or changes patient will contact our office for additional recommendations. Electronic Signature(s) Signed: 12/19/2020 5:38:46 PM By: Lenda Kelp PA-C Entered By: Lenda Kelp on 12/19/2020 17:38:46 [place], Zorita [name] ([id number]) -------------------------------------------------------------------------------- SuperBill Details Patient Name: [name], [name] Date of Service: 12/19/2020 Medical Record Number: [id number] Patient Account Number: [id number] Date of Birth/Sex: 14-Feb-1950 (70 y.o. 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 Alzheimer's disease I10 Essential (primary) hypertension Facility Procedures CPT4 Code: [id number] Description: 99213 - WOUND CARE VISIT-LEV 3 EST PT Modifier: Quantity: 1 Physician Procedures CPT4 Code: [id number] Description: 99213 - WC PHYS LEVEL 3 - EST PT Modifier: Quantity: 1 CPT4 Code: Description: ICD-10 Diagnosis Description B35.4 Tinea corporis L98.492 Non-pressure chronic ulcer of skin of other sites with fat layer expos G30.8 Other Alzheimer's disease I10 Essential (primary) hypertension Modifier: ed Quantity: Electronic Signature(s) Signed: 12/19/2020 5:39:31 PM By: Lenda Kelp PA-C Previous Signature: 12/19/2020 3:56:18 PM Version By: Elliot Gurney, BSN, RN, CWS, Kim RN, BSN Entered By: Lenda Kelp on 12/19/2020 17:39:30

## 2020-12-19 NOTE — Progress Notes (Signed)
SHACORIA, LATIF ([id number]) Visit Report for 12/19/2020 Arrival Information Details Patient Name: [name] [name], [name] [name]. Date of Service: 12/19/2020 8:15 AM Medical Record Number: [id number] Patient Account Number: [id number] Date of Birth/Sex: 07/14/1950 (70 y.o. F) Treating RN: Huel Coventry Primary Care Rashaunda Rahl: Olevia Perches Other Clinician: Referring Pike Scantlebury: Olevia Perches Treating Nadra Hritz/Extender: Rowan Blase in Treatment: 2 Visit Information History Since Last Visit Added or deleted any medications: No Patient Arrived: Ambulatory Has Dressing in Place as Prescribed: Yes Arrival Time: 08:19 Pain Present Now: No Accompanied By: husband Transfer Assistance: None Patient Identification Verified: Yes Secondary Verification Process Completed: Yes Patient Requires Transmission-Based Precautions: No Patient Has Alerts: No Electronic Signature(s) Signed: 12/19/2020 3:56:18 PM By: Elliot Gurney, BSN, RN, CWS, Kim RN, BSN Entered By: Elliot Gurney, BSN, RN, CWS, Kim on 12/19/2020 08:19:16 Sautter, [name] [name] ([id number]) -------------------------------------------------------------------------------- Clinic Level of Care Assessment Details Patient Name: [name] [name], [name] Date of Service: 12/19/2020 8:15 AM Medical Record Number: [id number] Patient Account Number: [id number] Date of Birth/Sex: 1950-08-16 (70 y.o. F) Treating RN: Huel Coventry Primary Care Seraiah Nowack: Olevia Perches Other Clinician: Referring Rodney Wigger: Olevia Perches Treating Shanitha Twining/Extender: Rowan Blase in Treatment: 2 Clinic Level of Care Assessment Items TOOL 4 Quantity Score []  - Use when only an EandM is performed on FOLLOW-UP visit 0 ASSESSMENTS - Nursing Assessment / Reassessment X - Reassessment of Co-morbidities (includes updates in patient status) 1 10 X- 1 5 Reassessment of Adherence to Treatment Plan ASSESSMENTS - Wound and Skin Assessment / Reassessment X - Simple Wound Assessment / Reassessment - one wound 1  5 []  - 0 Complex Wound Assessment / Reassessment - multiple wounds []  - 0 Dermatologic / Skin Assessment (not related to wound area) ASSESSMENTS - Focused Assessment []  - Circumferential Edema Measurements - multi extremities 0 []  - 0 Nutritional Assessment / Counseling / Intervention []  - 0 Lower Extremity Assessment (monofilament, tuning fork, pulses) []  - 0 Peripheral Arterial Disease Assessment (using hand held doppler) ASSESSMENTS - Ostomy and/or Continence Assessment and Care []  - Incontinence Assessment and Management 0 []  - 0 Ostomy Care Assessment and Management (repouching, etc.) PROCESS - Coordination of Care X - Simple Patient / Family Education for ongoing care 1 15 []  - 0 Complex (extensive) Patient / Family Education for ongoing care X- 1 10 Staff obtains Consents, Records, Test Results / Process Orders []  - 0 Staff telephones HHA, Nursing Homes / Clarify orders / etc []  - 0 Routine Transfer to another Facility (non-emergent condition) []  - 0 Routine Hospital Admission (non-emergent condition) []  - 0 New Admissions / / Ordering NPWT, Apligraf, etc. []  - 0 Emergency Hospital Admission (emergent condition) X- 1 10 Simple Discharge Coordination []  - 0 Complex (extensive) Discharge Coordination PROCESS - Special Needs []  - Pediatric / Minor Patient Management 0 []  - 0 Isolation Patient Management []  - 0 Hearing / Language / Visual special needs []  - 0 Assessment of Community assistance (transportation, D/C planning, etc.) []  - 0 Additional assistance / Altered mentation []  - 0 Support Surface(s) Assessment (bed, cushion, seat, etc.) INTERVENTIONS - Wound Cleansing / Measurement Tullis, Diva L. ( ) X- 1 5 Simple Wound Cleansing - one wound []  - 0 Complex Wound Cleansing - multiple wounds X- 1 5 Wound Imaging (photographs - any number of wounds) []  - 0 Wound Tracing (instead of photographs) X- 1 5 Simple Wound  Measurement - one wound []  - 0 Complex Wound Measurement - multiple wounds INTERVENTIONS - Wound Dressings X - Small Wound Dressing one or multiple  wounds 1 10 []  - 0 Medium Wound Dressing one or multiple wounds []  - 0 Large Wound Dressing one or multiple wounds []  - 0 Application of Medications - topical []  - 0 Application of Medications - injection INTERVENTIONS - Miscellaneous []  - External ear exam 0 []  - 0 Specimen Collection (cultures, biopsies, blood, body fluids, etc.) []  - 0 Specimen(s) / Culture(s) sent or taken to Lab for analysis []  - 0 Patient Transfer (multiple staff / Nurse, adult / Similar devices) []  - 0 Simple Staple / Suture removal (25 or less) []  - 0 Complex Staple / Suture removal (26 or more) []  - 0 Hypo / Hyperglycemic Management (close monitor of Blood Glucose) []  - 0 Ankle / Brachial Index (ABI) - do not check if billed separately X- 1 5 Vital Signs Has the patient been seen at the hospital within the last three years: Yes Total Score: 85 Level Of Care: New/Established - Level 3 Electronic Signature(s) Signed: 12/19/2020 3:56:18 PM By: Elliot Gurney, BSN, RN, CWS, Kim RN, BSN Entered By: Elliot Gurney, BSN, RN, CWS, Kim on 12/19/2020 08:43:06 Lortz, [name] [name] ([id number]) -------------------------------------------------------------------------------- Encounter Discharge Information Details Patient Name: [name] [name], [name] Date of Service: 12/19/2020 8:15 AM Medical Record Number: [id number] Patient Account Number: [id number] Date of Birth/Sex: May 16, 1950 (70 y.o. F) Treating RN: Huel Coventry Primary Care Imogean Ciampa: Olevia Perches Other Clinician: Referring Kamalani Mastro: Olevia Perches Treating Anayely Constantine/Extender: Rowan Blase in Treatment: 2 Encounter Discharge Information Items Discharge Condition: Stable Ambulatory Status: Ambulatory Discharge Destination: Home Transportation: Private Auto Accompanied By: husband Schedule Follow-up Appointment: Yes Clinical  Summary of Care: Electronic Signature(s) Signed: 12/19/2020 3:56:18 PM By: Elliot Gurney, BSN, RN, CWS, Kim RN, BSN Entered By: Elliot Gurney, BSN, RN, CWS, Kim on 12/19/2020 08:46:12 Narula, [name] [name] ([id number]) -------------------------------------------------------------------------------- Lower Extremity Assessment Details Patient Name: [name] [name], [name] Date of Service: 12/19/2020 8:15 AM Medical Record Number: [id number] Patient Account Number: [id number] Date of Birth/Sex: January 24, 1951 (70 y.o. F) Treating RN: Huel Coventry Primary Care Cashus Halterman: Olevia Perches Other Clinician: Referring Jacaria Colburn: Olevia Perches Treating Byrd Rushlow/Extender: Rowan Blase in Treatment: 2 Electronic Signature(s) Signed: 12/19/2020 3:56:18 PM By: Elliot Gurney, BSN, RN, CWS, Kim RN, BSN Entered By: Elliot Gurney, BSN, RN, CWS, Kim on 12/19/2020 08:29:05 Bonny, [name] [name] ([id number]) -------------------------------------------------------------------------------- Multi Wound Chart Details Patient Name: [name] [name], [name] Date of Service: 12/19/2020 8:15 AM Medical Record Number: [id number] Patient Account Number: [id number] Date of Birth/Sex: [birth year] (70 y.o. F) Treating RN: Huel Coventry Primary Care Lillyahna Hemberger: Olevia Perches Other Clinician: Referring Abygayle Deltoro: Olevia Perches Treating Ashaad Gaertner/Extender: Allen Derry Weeks in Treatment: 2 Vital Signs Height(in): 64 Pulse(bpm): 80 Weight(lbs): 172 Blood Pressure(mmHg): 134/82 Body Mass Index(BMI): 30 Temperature(F): 98.2 Respiratory Rate(breaths/min): 16 Photos: [N/A:N/A] Wound Location: Abdomen - midline N/A N/A Wounding Event: Gradually Appeared N/A N/A Primary Etiology: Fungal N/A N/A Comorbid History: Hypertension N/A N/A Date Acquired: 09/01/2020 N/A N/A Weeks of Treatment: 2 N/A N/A Wound Status: Open N/A N/A Measurements L x W x D (cm) 1.5x1.5x0.1 N/A N/A Area (cm) : 1.767 N/A N/A Volume (cm) : 0.177 N/A N/A % Reduction in Area: -47.00% N/A N/A % Reduction in  Volume: 26.30% N/A N/A Classification: Full Thickness Without Exposed N/A N/A Support Structures Exudate Amount: Medium N/A N/A Exudate Type: Serosanguineous N/A N/A Exudate Color: red, brown N/A N/A Granulation Amount: Medium (34-66%) N/A N/A Granulation Quality: Red N/A N/A Necrotic Amount: Small (1-33%) N/A N/A Exposed Structures: Fat Layer (Subcutaneous Tissue): N/A N/A Yes Fascia: No Tendon: No Muscle: No Joint: No Bone: No Epithelialization: None N/A N/A  Treatment Notes Electronic Signature(s) Signed: 12/19/2020 3:56:18 PM By: Elliot Gurney, BSN, RN, CWS, Kim RN, BSN Entered By: Elliot Gurney, BSN, RN, CWS, Kim on 12/19/2020 08:38:07 Swett, [name] [name] ([id number]) -------------------------------------------------------------------------------- Multi-Disciplinary Care Plan Details Patient Name: [name] [name], [name] Date of Service: 12/19/2020 8:15 AM Medical Record Number: [id number] Patient Account Number: [id number] Date of Birth/Sex: August 31, 1950 (70 y.o. F) Treating RN: Huel Coventry Primary Care Hajira Verhagen: Olevia Perches Other Clinician: Referring Gianne Shugars: Olevia Perches Treating Mackena Plummer/Extender: Rowan Blase in Treatment: 2 Active Inactive Nutrition Nursing Diagnoses: Potential for alteratiion in Nutrition/Potential for imbalanced nutrition Goals: Patient/caregiver agrees to and verbalizes understanding of need to use nutritional supplements and/or vitamins as prescribed Date Initiated: 12/01/2020 Target Resolution Date: 12/31/2020 Goal Status: Active Interventions: Assess patient nutrition upon admission and as needed per policy Notes: Wound/Skin Impairment Nursing Diagnoses: Knowledge deficit related to ulceration/compromised skin integrity Goals: Patient will have a decrease in wound volume by X% from date: (specify in notes) Date Initiated: 12/01/2020 Target Resolution Date: 12/31/2020 Goal Status: Active Patient/caregiver will verbalize understanding of skin care  regimen Date Initiated: 12/01/2020 Target Resolution Date: 01/30/2021 Goal Status: Active Ulcer/skin breakdown will have a volume reduction of 30% by week 4 Date Initiated: 12/01/2020 Target Resolution Date: 03/02/2021 Goal Status: Active Ulcer/skin breakdown will have a volume reduction of 50% by week 8 Date Initiated: 12/01/2020 Target Resolution Date: 03/31/2021 Goal Status: Active Ulcer/skin breakdown will have a volume reduction of 80% by week 12 Date Initiated: 12/01/2020 Target Resolution Date: 04/28/2021 Goal Status: Active Ulcer/skin breakdown will heal within 14 weeks Date Initiated: 12/01/2020 Target Resolution Date: 05/29/2021 Goal Status: Active Interventions: Assess patient/caregiver ability to obtain necessary supplies Assess patient/caregiver ability to perform ulcer/skin care regimen upon admission and as needed Assess ulceration(s) every visit Notes: Electronic Signature(s) Signed: 12/19/2020 3:56:18 PM By: Elliot Gurney, BSN, RN, CWS, Kim RN, BSN Entered By: Elliot Gurney, BSN, RN, CWS, Kim on 12/19/2020 08:38:00 [place]iskup, [name] [name] ([id number]) -------------------------------------------------------------------------------- Pain Assessment Details Patient Name: [name] [name], [name] Date of Service: 12/19/2020 8:15 AM Medical Record Number: [id number] Patient Account Number: [id number] Date of Birth/Sex: 07/13/1950 (70 y.o. F) Treating RN: Huel Coventry Primary Care Lorence Nagengast: Olevia Perches Other Clinician: Referring Devlin Mcveigh: Olevia Perches Treating Fariha Goto/Extender: Allen Derry Weeks in Treatment: 2 Active Problems Location of Pain Severity and Description of Pain Patient Has Paino No Site Locations Pain Management and Medication Current Pain Management: Notes Patient denies pain at this time. Electronic Signature(s) Signed: 12/19/2020 3:56:18 PM By: Elliot Gurney, BSN, RN, CWS, Kim RN, BSN Entered By: Elliot Gurney, BSN, RN, CWS, Kim on 12/19/2020 08:23:06 [name] [name], [name] [name]  ([id number]) -------------------------------------------------------------------------------- Patient/Caregiver Education Details Patient Name: [name] [name], [name] Date of Service: 12/19/2020 8:15 AM Medical Record Number: [id number] Patient Account Number: [id number] Date of Birth/Gender: 1950/08/29 (70 y.o. F) Treating RN: Huel Coventry Primary Care Physician: Olevia Perches Other Clinician: Referring Physician: Olevia Perches Treating Physician/Extender: Rowan Blase in Treatment: 2 Education Assessment Education Provided To: Patient Education Topics Provided Wound/Skin Impairment: Handouts: Caring for Your Ulcer Methods: Demonstration, Explain/Verbal Responses: State content correctly Electronic Signature(s) Signed: 12/19/2020 3:56:18 PM By: Elliot Gurney, BSN, RN, CWS, Kim RN, BSN Entered By: Elliot Gurney, BSN, RN, CWS, Kim on 12/19/2020 08:44:28 [name] [name], [name] [name] ([id number]) -------------------------------------------------------------------------------- Wound Assessment Details Patient Name: [name] [name], [name] Date of Service: 12/19/2020 8:15 AM Medical Record Number: [id number] Patient Account Number: [id number] Date of Birth/Sex: [birth year] (70 y.o. F) Treating RN: Huel Coventry Primary Care Jacqualine Weichel: Olevia Perches Other Clinician: Referring Bascom Biel: Olevia Perches Treating Aalivia Mcgraw/Extender: Allen Derry Weeks in Treatment: 2 Wound Status  Wound Number: 1 Primary Etiology: Fungal Wound Location: Abdomen - midline Wound Status: Open Wounding Event: Gradually Appeared Comorbid History: Hypertension Date Acquired: 09/01/2020 Weeks Of Treatment: 2 Clustered Wound: No Photos Wound Measurements Length: (cm) 1.5 Width: (cm) 1.5 Depth: (cm) 0.1 Area: (cm) 1.767 Volume: (cm) 0.177 % Reduction in Area: -47% % Reduction in Volume: 26.3% Epithelialization: None Tunneling: No Undermining: No Wound Description Classification: Full Thickness Without Exposed Support Structu Exudate Amount:  Medium Exudate Type: Serosanguineous Exudate Color: red, brown res Foul Odor After Cleansing: No Slough/Fibrino Yes Wound Bed Granulation Amount: Medium (34-66%) Exposed Structure Granulation Quality: Red Fascia Exposed: No Necrotic Amount: Small (1-33%) Fat Layer (Subcutaneous Tissue) Exposed: Yes Necrotic Quality: Adherent Slough Tendon Exposed: No Muscle Exposed: No Joint Exposed: No Bone Exposed: No Treatment Notes Wound #1 (Abdomen - midline) Cleanser Peri-Wound Care Topical Primary Dressing [name] [name], [name] [name] ([id number]) Silvercel Small 2x2 (in/in) Discharge Instruction: Apply Silvercel Small 2x2 (in/in) as instructed Secondary Dressing Secured With Tegaderm Film 4x4 (in/in) Discharge Instruction: Apply to wound bed Compression Wrap Compression Stockings Add-Ons Electronic Signature(s) Signed: 12/19/2020 3:56:18 PM By: Elliot Gurney, BSN, RN, CWS, Kim RN, BSN Entered By: Elliot Gurney, BSN, RN, CWS, Kim on 12/19/2020 08:27:40 Echeverria, [name] [name] ([id number]) -------------------------------------------------------------------------------- Vitals Details Patient Name: Chipps, Evangaline L. Date of Service: 12/19/2020 8:15 AM Medical Record Number: [id number] Patient Account Number: [id number] Date of Birth/Sex: 20-Dec-1950 (70 y.o. F) Treating RN: Huel Coventry Primary Care Teodor Prater: Olevia Perches Other Clinician: Referring Yarithza Mink: Olevia Perches Treating Hoang Reich/Extender: Allen Derry Weeks in Treatment: 2 Vital Signs Time Taken: 16:20 Temperature (F): 98.2 Height (in): 64 Pulse (bpm): 80 Weight (lbs): 172 Respiratory Rate (breaths/min): 16 Body Mass Index (BMI): 29.5 Blood Pressure (mmHg): 134/82 Reference Range: 80 - 120 mg / dl Electronic Signature(s) Signed: 12/19/2020 3:56:18 PM By: Elliot Gurney, BSN, RN, CWS, Kim RN, BSN Entered By: Elliot Gurney, BSN, RN, CWS, Kim on 12/19/2020 08:22:52

## 2020-12-22 ENCOUNTER — Other Ambulatory Visit: Payer: Self-pay | Admitting: Family Medicine

## 2020-12-22 NOTE — Telephone Encounter (Signed)
Requested Prescriptions  Pending Prescriptions Disp Refills  . ARIPiprazole (ABILIFY) 5 MG tablet [Pharmacy Med Name: ARIPIPRAZOLE 5 MG TABLET] 90 tablet     Sig: Take 1 tablet (5 mg total) by mouth daily.     Not Delegated - Psychiatry:  Antipsychotics - Second Generation (Atypical) - aripiprazole Failed - 12/22/2020 10:34 AM      Failed - This refill cannot be delegated      Passed - Valid encounter within last 6 months    Recent Outpatient Visits          1 month ago Essential (primary) hypertension   [facility] [place], [name], DO   1 month ago Intertrigo   [employer], Jake Church, NP   2 months ago Intertrigo   [employer], Lauren A, NP   4 months ago Alzheimer's dementia without behavioral disturbance, unspecified timing of dementia onset (HCC)   [facility] [place], [name], DO   7 months ago Routine general medical examination at a health care facility   [facility] [place], Oralia Rud, DO      Future Appointments            In 2 months Laural Benes, Oralia Rud, DO [facility], [facility]   In 4 months  [facility], [facility]           Refused Prescriptions Disp Refills  . buPROPion (WELLBUTRIN XL) 300 MG 24 hr tablet [Pharmacy Med Name: BUPROPION HCL XL 300 MG TABLET] 90 tablet     Sig: Take 1 tablet (300 mg total) by mouth daily.     Psychiatry: Antidepressants - bupropion Passed - 12/22/2020 10:34 AM      Passed - Completed PHQ-2 or PHQ-9 in the last 360 days      Passed - Last BP in normal range    BP Readings from Last 1 Encounters:  11/21/20 114/78         Passed - Valid encounter within last 6 months    Recent Outpatient Visits          1 month ago Essential (primary) hypertension   [facility] [place], [name], DO   1 month ago Intertrigo   [employer], Jake Church, NP   2 months ago Intertrigo   [employer],  Lauren A, NP   4 months ago Alzheimer's dementia without behavioral disturbance, unspecified timing of dementia onset (HCC)   [facility] [place], [name], DO   7 months ago Routine general medical examination at a health care facility   [facility] [place], [place], DO      Future Appointments            In 2 months Laural Benes, Oralia Rud, DO [facility]   In 4 months  [facility], [facility]

## 2020-12-22 NOTE — Telephone Encounter (Signed)
Requested medication (s) are due for refill today: no  Requested medication (s) are on the active medication list: yes  Last refill:  11/21/20  Future visit scheduled: yes  Notes to clinic:  med not delegated to NT to RF   Requested Prescriptions  Pending Prescriptions Disp Refills   ARIPiprazole (ABILIFY) 5 MG tablet [Pharmacy Med Name: ARIPIPRAZOLE 5 MG TABLET] 90 tablet     Sig: Take 1 tablet (5 mg total) by mouth daily.     Not Delegated - Psychiatry:  Antipsychotics - Second Generation (Atypical) - aripiprazole Failed - 12/22/2020 10:34 AM      Failed - This refill cannot be delegated      Passed - Valid encounter within last 6 months    Recent Outpatient Visits           1 month ago Essential (primary) hypertension   [facility] [place], [name], DO   1 month ago Intertrigo   [employer], Jake Church, NP   2 months ago Intertrigo   [employer], Lauren A, NP   4 months ago Alzheimer's dementia without behavioral disturbance, unspecified timing of dementia onset (HCC)   [facility] [place], [name], DO   7 months ago Routine general medical examination at a health care facility   [facility] [place], [place], DO       Future Appointments             In 2 months Johnson, [name], DO [facility]   In 4 months  [facility], [facility]            Refused Prescriptions Disp Refills   buPROPion (WELLBUTRIN XL) 300 MG 24 hr tablet [Pharmacy Med Name: BUPROPION HCL XL 300 MG TABLET] 90 tablet     Sig: Take 1 tablet (300 mg total) by mouth daily.     Psychiatry: Antidepressants - bupropion Passed - 12/22/2020 10:34 AM      Passed - Completed PHQ-2 or PHQ-9 in the last 360 days      Passed - Last BP in normal range    BP Readings from Last 1 Encounters:  11/21/20 114/78          Passed - Valid encounter within last 6 months    Recent Outpatient Visits            1 month ago Essential (primary) hypertension   [facility] [place], [name], DO   1 month ago Intertrigo   [employer], Jake Church, NP   2 months ago Intertrigo   [employer], Lauren A, NP   4 months ago Alzheimer's dementia without behavioral disturbance, unspecified timing of dementia onset (HCC)   [facility] [place], [name], DO   7 months ago Routine general medical examination at a health care facility   [facility] [place], [place], DO       Future Appointments             In 2 months Laural Benes, Oralia Rud, DO [facility]   In 4 months  [facility], [facility]

## 2020-12-23 ENCOUNTER — Other Ambulatory Visit: Payer: Self-pay | Admitting: Family Medicine

## 2020-12-23 NOTE — Telephone Encounter (Signed)
Requested Prescriptions  Refused Prescriptions Disp Refills  . buPROPion (WELLBUTRIN XL) 300 MG 24 hr tablet [Pharmacy Med Name: BUPROPION HCL XL 300 MG TABLET] 90 tablet 0    Sig: Take 1 tablet (300 mg total) by mouth daily.     Psychiatry: Antidepressants - bupropion Passed - 12/23/2020  2:18 PM      Passed - Completed PHQ-2 or PHQ-9 in the last 360 days      Passed - Last BP in normal range    BP Readings from Last 1 Encounters:  11/21/20 114/78         Passed - Valid encounter within last 6 months    Recent Outpatient Visits          1 month ago Essential (primary) hypertension   [facility] [place], [name], DO   1 month ago Intertrigo   [employer], Jake Church, NP   2 months ago Intertrigo   [employer], Lauren A, NP   4 months ago Alzheimer's dementia without behavioral disturbance, unspecified timing of dementia onset (HCC)   [facility] [place], [name], DO   7 months ago Routine general medical examination at a health care facility   [facility] [place], [place], DO      Future Appointments            In 2 months Laural Benes, Oralia Rud, DO [facility]   In 4 months  [facility], [facility]           . ARIPiprazole (ABILIFY) 5 MG tablet [Pharmacy Med Name: ARIPIPRAZOLE 5 MG TABLET] 90 tablet 0    Sig: Take 1 tablet (5 mg total) by mouth daily.     Not Delegated - Psychiatry:  Antipsychotics - Second Generation (Atypical) - aripiprazole Failed - 12/23/2020  2:18 PM      Failed - This refill cannot be delegated      Passed - Valid encounter within last 6 months    Recent Outpatient Visits          1 month ago Essential (primary) hypertension   [facility] [place], [name], DO   1 month ago Intertrigo   [employer], Jake Church, NP   2 months ago Intertrigo   [employer], Lauren A, NP   4 months ago Alzheimer's  dementia without behavioral disturbance, unspecified timing of dementia onset (HCC)   [facility] [place], [name], DO   7 months ago Routine general medical examination at a health care facility   [facility] [place], Oralia Rud, DO      Future Appointments            In 2 months Laural Benes, Oralia Rud, DO [facility]   In 4 months  [facility], [facility]

## 2020-12-24 ENCOUNTER — Other Ambulatory Visit: Payer: Self-pay | Admitting: Family Medicine

## 2020-12-24 NOTE — Telephone Encounter (Signed)
[facility] called and spoke to [place], [facility] about the refill(s) Wellbutrin and [place] requested. Advised it was sent on 11/21/20 #90/1 refill(s). She states that these medications were not received and will be to be resent to fill. Will resend medications over.   Requested Prescriptions  Signed Prescriptions Disp Refills   buPROPion (WELLBUTRIN XL) 300 MG 24 hr tablet 90 tablet 1    Sig: Take 1 tablet (300 mg total) by mouth daily.     Psychiatry: Antidepressants - bupropion Passed - 12/24/2020 12:15 PM      Passed - Completed PHQ-2 or PHQ-9 in the last 360 days      Passed - Last BP in normal range    BP Readings from Last 1 Encounters:  11/21/20 114/78          Passed - Valid encounter within last 6 months    Recent Outpatient Visits           1 month ago Essential (primary) hypertension   [facility] [place], [name], DO   1 month ago [facility]   [employer], Jake Church, NP   2 months ago [facility]   [employer], Lauren A, NP   4 months ago Alzheimer's dementia without behavioral disturbance, unspecified timing of dementia onset (HCC)   [facility] Johnson, [name], DO   7 months ago Routine general medical examination at a health care facility   [facility] [place], [place], DO       Future Appointments             In 2 months Johnson, [name], DO [facility], [facility]   In 4 months  [facility], [facility]             ARIPiprazole (ABILIFY) 5 MG tablet 90 tablet 1    Sig: Take 1 tablet (5 mg total) by mouth daily.     Not Delegated - Psychiatry:  Antipsychotics - Second Generation (Atypical) - aripiprazole Failed - 12/24/2020 12:15 PM      Failed - This refill cannot be delegated      Passed - Valid encounter within last 6 months    Recent Outpatient Visits           1 month ago Essential (primary) hypertension   [facility] [place], Megan  P, DO   1 month ago [facility]   [employer], Jake Church, NP   2 months ago [facility]   [employer], Lauren A, NP   4 months ago Alzheimer's dementia without behavioral disturbance, unspecified timing of dementia onset (HCC)   [facility] Johnson, [name], DO   7 months ago Routine general medical examination at a health care facility   [facility] [place], [place], DO       Future Appointments             In 2 months Laural Benes, Oralia Rud, DO [facility]   In 4 months  [facility], [facility]

## 2020-12-31 DIAGNOSIS — F0280 Dementia in other diseases classified elsewhere without behavioral disturbance: Secondary | ICD-10-CM | POA: Diagnosis not present

## 2020-12-31 DIAGNOSIS — E538 Deficiency of other specified B group vitamins: Secondary | ICD-10-CM | POA: Diagnosis not present

## 2020-12-31 DIAGNOSIS — F0150 Vascular dementia without behavioral disturbance: Secondary | ICD-10-CM | POA: Diagnosis not present

## 2020-12-31 DIAGNOSIS — R4189 Other symptoms and signs involving cognitive functions and awareness: Secondary | ICD-10-CM | POA: Diagnosis not present

## 2020-12-31 DIAGNOSIS — Z8673 Personal history of transient ischemic attack (TIA), and cerebral infarction without residual deficits: Secondary | ICD-10-CM | POA: Diagnosis not present

## 2020-12-31 DIAGNOSIS — G4733 Obstructive sleep apnea (adult) (pediatric): Secondary | ICD-10-CM | POA: Diagnosis not present

## 2020-12-31 DIAGNOSIS — G309 Alzheimer's disease, unspecified: Secondary | ICD-10-CM | POA: Diagnosis not present

## 2021-01-02 ENCOUNTER — Other Ambulatory Visit: Payer: Self-pay

## 2021-01-02 ENCOUNTER — Encounter: Payer: Medicare PPO | Attending: Physician Assistant | Admitting: Physician Assistant

## 2021-01-02 DIAGNOSIS — L98492 Non-pressure chronic ulcer of skin of other sites with fat layer exposed: Secondary | ICD-10-CM | POA: Diagnosis not present

## 2021-01-02 DIAGNOSIS — G308 Other Alzheimer's disease: Secondary | ICD-10-CM | POA: Diagnosis not present

## 2021-01-02 DIAGNOSIS — B354 Tinea corporis: Secondary | ICD-10-CM | POA: Diagnosis not present

## 2021-01-02 DIAGNOSIS — I10 Essential (primary) hypertension: Secondary | ICD-10-CM | POA: Diagnosis not present

## 2021-01-02 NOTE — Progress Notes (Signed)
NAZIYA, HEGWOOD ([id number]) Visit Report for 01/02/2021 Arrival Information Details Patient Name: [name] [name], [name] [name]. Date of Service: 01/02/2021 3:45 PM Medical Record Number: [id number] Patient Account Number: [id number] Date of Birth/Sex: December 28, 1950 (70 y.o. F) Treating RN: Angelina Pih Primary Care Kashden Deboy: Olevia Perches Other Clinician: Huel Coventry Referring Manju Kulkarni: Olevia Perches Treating Emilly Lavey/Extender: Rowan Blase in Treatment: 4 Visit Information History Since Last Visit Added or deleted any medications: No Patient Arrived: Ambulatory Any new allergies or adverse reactions: No Arrival Time: 15:47 Had a fall or experienced change in No Accompanied By: husband activities of daily living that may affect Transfer Assistance: None risk of falls: Patient Identification Verified: Yes Hospitalized since last visit: No Secondary Verification Process Completed: Yes Has Dressing in Place as Prescribed: Yes Patient Requires Transmission-Based Precautions: No Pain Present Now: No Patient Has Alerts: No Electronic Signature(s) Signed: 01/02/2021 4:20:22 PM By: Elliot Gurney, BSN, RN, CWS, Kim RN, BSN Entered By: Elliot Gurney, BSN, RN, CWS, Kim on 01/02/2021 16:20:22 Banks, [name] ([id number]) -------------------------------------------------------------------------------- Clinic Level of Care Assessment Details Patient Name: Wacha, Sabrena L. Date of Service: 01/02/2021 3:45 PM Medical Record Number: [id number] Patient Account Number: [id number] Date of Birth/Sex: 03-Feb-1950 (70 y.o. F) Treating RN: Angelina Pih Primary Care Loney Peto: Olevia Perches Other Clinician: Huel Coventry Referring Charee Tumblin: Olevia Perches Treating Kristyn Obyrne/Extender: Rowan Blase in Treatment: 4 Clinic Level of Care Assessment Items TOOL 4 Quantity Score []  - Use when only an EandM is performed on FOLLOW-UP visit 0 ASSESSMENTS - Nursing Assessment / Reassessment []  - Reassessment of Co-morbidities  (includes updates in patient status) 0 X- 1 5 Reassessment of Adherence to Treatment Plan ASSESSMENTS - Wound and Skin Assessment / Reassessment X - Simple Wound Assessment / Reassessment - one wound 1 5 []  - 0 Complex Wound Assessment / Reassessment - multiple wounds []  - 0 Dermatologic / Skin Assessment (not related to wound area) ASSESSMENTS - Focused Assessment []  - Circumferential Edema Measurements - multi extremities 0 []  - 0 Nutritional Assessment / Counseling / Intervention []  - 0 Lower Extremity Assessment (monofilament, tuning fork, pulses) []  - 0 Peripheral Arterial Disease Assessment (using hand held doppler) ASSESSMENTS - Ostomy and/or Continence Assessment and Care []  - Incontinence Assessment and Management 0 []  - 0 Ostomy Care Assessment and Management (repouching, etc.) PROCESS - Coordination of Care X - Simple Patient / Family Education for ongoing care 1 15 []  - 0 Complex (extensive) Patient / Family Education for ongoing care []  - 0 Staff obtains , Records, Test Results / Process Orders []  - 0 Staff telephones HHA, Nursing Homes / Clarify orders / etc []  - 0 Routine Transfer to another Facility (non-emergent condition) []  - 0 Routine Hospital Admission (non-emergent condition) []  - 0 New Admissions / / Ordering NPWT, Apligraf, etc. []  - 0 Emergency Hospital Admission (emergent condition) X- 1 10 Simple Discharge Coordination []  - 0 Complex (extensive) Discharge Coordination PROCESS - Special Needs []  - Pediatric / Minor Patient Management 0 []  - 0 Isolation Patient Management []  - 0 Hearing / Language / Visual special needs []  - 0 Assessment of Community assistance (transportation, D/C planning, etc.) []  - 0 Additional assistance / Altered mentation []  - 0 Support Surface(s) Assessment (bed, cushion, seat, etc.) INTERVENTIONS - Wound Cleansing / Measurement Nilson, Tyshia L. ( ) X- 1 5 Simple Wound  Cleansing - one wound []  - 0 Complex Wound Cleansing - multiple wounds X- 1 5 Wound Imaging (photographs - any number of wounds) []  - 0 Wound Tracing (  instead of photographs) X- 1 5 Simple Wound Measurement - one wound  - 0 Complex Wound Measurement - multiple wounds INTERVENTIONS - Wound Dressings X - Small Wound Dressing one or multiple wounds 1 10  - 0 Medium Wound Dressing one or multiple wounds  - 0 Large Wound Dressing one or multiple wounds  - 0 Application of Medications - topical  - 0 Application of Medications - injection INTERVENTIONS - Miscellaneous  - External ear exam 0  - 0 Specimen Collection (cultures, biopsies, blood, body fluids, etc.)  - 0 Specimen(s) / Culture(s) sent or taken to Lab for analysis  - 0 Patient Transfer (multiple staff / Nurse, adult / Similar devices)  - 0 Simple Staple / Suture removal (25 or less)  - 0 Complex Staple / Suture removal (26 or more)  - 0 Hypo / Hyperglycemic Management (close monitor of Blood Glucose)  - 0 Ankle / Brachial Index (ABI) - do not check if billed separately  - 0 Vital Signs Has the patient been seen at the hospital within the last three years: Yes Total Score: 60 Level Of Care: New/Established - Level 2 Electronic Signature(s) Signed: 01/02/2021 4:38:54 PM By: Elliot Gurney, BSN, RN, CWS, Kim RN, BSN Entered By: Elliot Gurney, BSN, RN, CWS, Kim on 01/02/2021 16:24:00 Whitmore, [name] ([id number]) -------------------------------------------------------------------------------- Encounter Discharge Information Details Patient Name: Simcoe, Aleigh L. Date of Service: 01/02/2021 3:45 PM Medical Record Number: [id number] Patient Account Number: [id number] Date of Birth/Sex: 1950-09-24 (70 y.o. F) Treating RN: Angelina Pih Primary Care Daivd Fredericksen: Olevia Perches Other Clinician: Huel Coventry Referring Lincy Belles: Olevia Perches Treating Layli Capshaw/Extender: Rowan Blase in Treatment: 4 Encounter  Discharge Information Items Discharge Condition: Stable Ambulatory Status: Ambulatory Discharge Destination: Home Transportation: Private Auto Accompanied By: husband Schedule Follow-up Appointment: Yes Clinical Summary of Care: Electronic Signature(s) Signed: 01/02/2021 4:25:19 PM By: Elliot Gurney, BSN, RN, CWS, Kim RN, BSN Entered By: Elliot Gurney, BSN, RN, CWS, Kim on 01/02/2021 16:25:19 Payment, [name] ([id number]) -------------------------------------------------------------------------------- Lower Extremity Assessment Details Patient Name: Tilson, Laquisha L. Date of Service: 01/02/2021 3:45 PM Medical Record Number: [id number] Patient Account Number: [id number] Date of Birth/Sex: [birth year] (70 y.o. F) Treating RN: Angelina Pih Primary Care Irina Okelly: Olevia Perches Other Clinician: Referring Crimson Beer: Olevia Perches Treating Kingstin Heims/Extender: Rowan Blase in Treatment: 4 Electronic Signature(s) Signed: 01/02/2021 4:20:45 PM By: Elliot Gurney BSN, RN, CWS, Kim RN, BSN Signed: 01/02/2021 4:26:07 PM By: Angelina Pih Entered By: Elliot Gurney BSN, RN, CWS, Kim on 01/02/2021 16:20:44 Oak, [name] ([id number]) -------------------------------------------------------------------------------- Multi Wound Chart Details Patient Name: Vanaken, Denaya L. Date of Service: 01/02/2021 3:45 PM Medical Record Number: [id number] Patient Account Number: [id number] Date of Birth/Sex: [birth year] (70 y.o. F) Treating RN: Angelina Pih Primary Care Rishawn Walck: Olevia Perches Other Clinician: Referring Jahid Weida: Olevia Perches Treating Izabela Ow/Extender: Allen Derry Weeks in Treatment: 4 Vital Signs Height(in): 64 Pulse(bpm): 91 Weight(lbs): 172 Blood Pressure(mmHg): 137/76 Body Mass Index(BMI): 30 Temperature(F): 97.7 Respiratory Rate(breaths/min): 18 Photos: [N/A:N/A] Wound Location: Abdomen - midline N/A N/A Wounding Event: Gradually Appeared N/A N/A Primary Etiology: Fungal N/A N/A Comorbid History:  Hypertension N/A N/A Date Acquired: 09/01/2020 N/A N/A Weeks of Treatment: 4 N/A N/A Wound Status: Open N/A N/A Measurements L x W x D (cm) 1.2x0.9x0.1 N/A N/A Area (cm) : 0.848 N/A N/A Volume (cm) : 0.085 N/A N/A % Reduction in Area: 29.50% N/A N/A % Reduction in Volume: 64.60% N/A N/A Classification: Full Thickness Without Exposed N/A N/A Support Structures Exudate Amount: Medium N/A N/A Exudate Type: Serous N/A N/A Exudate Color: amber N/A  N/A Granulation Amount: Medium (34-66%) N/A N/A Granulation Quality: Red N/A N/A Necrotic Amount: Small (1-33%) N/A N/A Exposed Structures: Fat Layer (Subcutaneous Tissue): N/A N/A Yes Fascia: No Tendon: No Muscle: No Joint: No Bone: No Epithelialization: None N/A N/A Treatment Notes Wound #1 (Abdomen - midline) Cleanser Peri-Wound Care Topical Primary Dressing Silvercel Small 2x2 (in/in) Glazer, Chevella L. ([id number]) Discharge Instruction: Apply Silvercel Small 2x2 (in/in) as instructed Secondary Dressing Secured With Tegaderm Film 4x4 (in/in) Discharge Instruction: Apply to wound bed Compression Wrap Compression Stockings Add-Ons Electronic Signature(s) Signed: 01/02/2021 4:21:14 PM By: Elliot Gurney, BSN, RN, CWS, Kim RN, BSN Entered By: Elliot Gurney, BSN, RN, CWS, Kim on 01/02/2021 16:21:14 Comrie, [name] ([id number]) -------------------------------------------------------------------------------- Multi-Disciplinary Care Plan Details Patient Name: Stai, Aribelle L. Date of Service: 01/02/2021 3:45 PM Medical Record Number: [id number] Patient Account Number: [id number] Date of Birth/Sex: [birth year] (70 y.o. F) Treating RN: Angelina Pih Primary Care [employer]: Olevia Perches Other Clinician: Referring Astraea Gaughran: Olevia Perches Treating Doss Cybulski/Extender: Rowan Blase in Treatment: 4 Active Inactive Nutrition Nursing Diagnoses: Potential for alteratiion in Nutrition/Potential for imbalanced nutrition Goals: Patient/caregiver  agrees to and verbalizes understanding of need to use nutritional supplements and/or vitamins as prescribed Date Initiated: 12/01/2020 Target Resolution Date: 12/31/2020 Goal Status: Active Interventions: Assess patient nutrition upon admission and as needed per policy Notes: Wound/Skin Impairment Nursing Diagnoses: Knowledge deficit related to ulceration/compromised skin integrity Goals: Patient will have a decrease in wound volume by X% from date: (specify in notes) Date Initiated: 12/01/2020 Target Resolution Date: 12/31/2020 Goal Status: Active Patient/caregiver will verbalize understanding of skin care regimen Date Initiated: 12/01/2020 Target Resolution Date: 01/30/2021 Goal Status: Active Ulcer/skin breakdown will have a volume reduction of 30% by week 4 Date Initiated: 12/01/2020 Target Resolution Date: 03/02/2021 Goal Status: Active Ulcer/skin breakdown will have a volume reduction of 50% by week 8 Date Initiated: 12/01/2020 Target Resolution Date: 03/31/2021 Goal Status: Active Ulcer/skin breakdown will have a volume reduction of 80% by week 12 Date Initiated: 12/01/2020 Target Resolution Date: 04/28/2021 Goal Status: Active Ulcer/skin breakdown will heal within 14 weeks Date Initiated: 12/01/2020 Target Resolution Date: 05/29/2021 Goal Status: Active Interventions: Assess patient/caregiver ability to obtain necessary supplies Assess patient/caregiver ability to perform ulcer/skin care regimen upon admission and as needed Assess ulceration(s) every visit Notes: Electronic Signature(s) Signed: 01/02/2021 4:20:51 PM By: Elliot Gurney, BSN, RN, CWS, Kim RN, BSN Signed: 01/02/2021 4:26:07 PM By: Angelina Pih Entered By: Elliot Gurney BSN, RN, CWS, Kim on 01/02/2021 16:20:51 Wheelwright, [name] ([id number]) -------------------------------------------------------------------------------- Pain Assessment Details Patient Name: Kijowski, Lima L. Date of Service: 01/02/2021 3:45 PM Medical  Record Number: [id number] Patient Account Number: [id number] Date of Birth/Sex: 1950-05-12 (70 y.o. F) Treating RN: Angelina Pih Primary Care Ernestyne Caldwell: Olevia Perches Other Clinician: Referring Shrita Thien: Olevia Perches Treating Angeleah Labrake/Extender: Rowan Blase in Treatment: 4 Active Problems Location of Pain Severity and Description of Pain Patient Has Paino No Site Locations Rate the pain. Current Pain Level: 0 Pain Management and Medication Current Pain Management: Electronic Signature(s) Signed: 01/02/2021 4:20:33 PM By: Elliot Gurney, BSN, RN, CWS, Kim RN, BSN Signed: 01/02/2021 4:26:07 PM By: Angelina Pih Entered By: Elliot Gurney BSN, RN, CWS, Kim on 01/02/2021 16:20:32 Lohmann, [name] ([id number]) -------------------------------------------------------------------------------- Patient/Caregiver Education Details Patient Name: Shanafelt, Sanjuana L. Date of Service: 01/02/2021 3:45 PM Medical Record Number: [id number] Patient Account Number: [id number] Date of Birth/Gender: [birth year] (70 y.o. F) Treating RN: Angelina Pih Primary Care Physician: Olevia Perches Other Clinician: Huel Coventry Referring Physician: Olevia Perches Treating Physician/Extender: Rowan Blase in Treatment: 4 Education Assessment  Education Provided To: Patient and Caregiver Education Topics Provided Wound/Skin Impairment: Handouts: Caring for Your Ulcer Methods: Explain/Verbal Responses: State content correctly Electronic Signature(s) Signed: 01/02/2021 4:38:54 PM By: Elliot Gurney, BSN, RN, CWS, Kim RN, BSN Entered By: Elliot Gurney, BSN, RN, CWS, Kim on 01/02/2021 16:24:31 Darco, [name] ([id number]) -------------------------------------------------------------------------------- Wound Assessment Details Patient Name: Knieriem, Kassie L. Date of Service: 01/02/2021 3:45 PM Medical Record Number: [id number] Patient Account Number: [id number] Date of Birth/Sex: 10/28/1950 (70 y.o. F) Treating RN: Angelina Pih Primary  Care Daryle Boyington: Olevia Perches Other Clinician: Referring Dmarcus Decicco: Olevia Perches Treating Merrie Epler/Extender: Allen Derry Weeks in Treatment: 4 Wound Status Wound Number: 1 Primary Etiology: Fungal Wound Location: Abdomen - midline Wound Status: Open Wounding Event: Gradually Appeared Comorbid History: Hypertension Date Acquired: 09/01/2020 Weeks Of Treatment: 4 Clustered Wound: No Photos Wound Measurements Length: (cm) 1.2 Width: (cm) 0.9 Depth: (cm) 0.1 Area: (cm) 0.848 Volume: (cm) 0.085 % Reduction in Area: 29.5% % Reduction in Volume: 64.6% Epithelialization: None Tunneling: No Undermining: No Wound Description Classification: Full Thickness Without Exposed Support Structu Exudate Amount: Medium Exudate Type: Serous Exudate Color: amber res Foul Odor After Cleansing: No Slough/Fibrino Yes Wound Bed Granulation Amount: Medium (34-66%) Exposed Structure Granulation Quality: Red Fascia Exposed: No Necrotic Amount: Small (1-33%) Fat Layer (Subcutaneous Tissue) Exposed: Yes Necrotic Quality: Adherent Slough Tendon Exposed: No Muscle Exposed: No Joint Exposed: No Bone Exposed: No Treatment Notes Wound #1 (Abdomen - midline) Cleanser Peri-Wound Care Topical Primary Dressing Lites, [name] ([id number]) Silvercel Small 2x2 (in/in) Discharge Instruction: Apply Silvercel Small 2x2 (in/in) as instructed Secondary Dressing Secured With Tegaderm Film 4x4 (in/in) Discharge Instruction: Apply to wound bed Compression Wrap Compression Stockings Add-Ons Electronic Signature(s) Signed: 01/02/2021 4:26:07 PM By: Angelina Pih Entered By: Angelina Pih on 01/02/2021 15:54:50 Tanguma, [name] ([id number]) -------------------------------------------------------------------------------- Vitals Details Patient Name: Perkins, Deslyn L. Date of Service: 01/02/2021 3:45 PM Medical Record Number: [id number] Patient Account Number: [id number] Date of Birth/Sex: 01/22/1951  (70 y.o. F) Treating RN: Angelina Pih Primary Care Aceyn Kathol: Olevia Perches Other Clinician: Referring Felma Pfefferle: Olevia Perches Treating Jacie Tristan/Extender: Allen Derry Weeks in Treatment: 4 Vital Signs Time Taken: 15:50 Temperature (F): 97.7 Height (in): 64 Pulse (bpm): 91 Weight (lbs): 172 Respiratory Rate (breaths/min): 18 Body Mass Index (BMI): 29.5 Blood Pressure (mmHg): 137/76 Reference Range: 80 - 120 mg / dl Electronic Signature(s) Signed: 01/02/2021 4:20:26 PM By: Elliot Gurney, BSN, RN, CWS, Kim RN, BSN Entered By: Elliot Gurney, BSN, RN, CWS, Kim on 01/02/2021 16:20:26

## 2021-01-02 NOTE — Progress Notes (Addendum)
CALISE, DUNCKEL ([id number]) Visit Report for 01/02/2021 Chief Complaint Document Details Patient Name: [name], [name] Date of Service: 01/02/2021 3:45 PM Medical Record Number: [id number] Patient Account Number: [id number] Date of Birth/Sex: 1951-01-28 (70 y.o. F) Treating RN: Huel Coventry Primary Care Provider: Olevia Perches Other Clinician: Referring Provider: Olevia Perches Treating Provider/Extender: Allen Derry Weeks in Treatment: 4 Information Obtained from: Patient Chief Complaint Midline abdominal ulcer Electronic Signature(s) Signed: 01/02/2021 3:52:45 PM By: Lenda Kelp PA-C Entered By: Lenda Kelp on 01/02/2021 15:52:45 [name], [name] ([id number]) -------------------------------------------------------------------------------- HPI Details Patient Name: [name], [name] Date of Service: 01/02/2021 3:45 PM Medical Record Number: [id number] Patient Account Number: [id number] Date of Birth/Sex: 02-10-1950 (70 y.o. F) Treating RN: Huel Coventry Primary Care Provider: Olevia Perches Other Clinician: Referring Provider: Olevia Perches Treating Provider/Extender: Rowan Blase in Treatment: 4 History of Present Illness HPI Description: 12/01/2020 upon evaluation today patient presents for initial inspection here in the clinic concerning a wound over the midline lower abdominal region which is underneath her pannus. Subsequently she has been using Lotrisone and nystatin powder in general over the area but then subsequently had a region here in particular where she had a larger area of skin breakdown I think this is simply due to the skin contact to the opposite side skin and moisture trapping underneath. It does have a very distinctive ringworm type appearance with a central clearing and erythematous border around and then clear outside. She has a history of Alzheimer's and hypertension but otherwise no major medical problems. 12/12/2020 upon evaluation today patient appears  to be doing a little bit more poorly in regard to her wound. There is some evidence of bruising where the tape from the dressing seems to be sticking to her skin causing some trouble here. I think we may want to switch things up from a dressing standpoint to some degree here. 12/19/2020 upon evaluation today patient's wound is actually showing signs of significant improvement. I am actually extremely pleased with where we stand this week. The patient is likewise very happy with where we are. There is no signs of active infection which is also great news. No fevers, chills, nausea, vomiting, or diarrhea. 01/02/2021 upon evaluation today patient appears to be doing well with regard to her wound. This is actually measuring significantly smaller which is great news and overall I think we are getting very close to resolution. I do not see any signs of active infection at this time. Electronic Signature(s) Signed: 01/02/2021 4:39:51 PM By: Lenda Kelp PA-C Entered By: Lenda Kelp on 01/02/2021 16:39:51 [name], [name] ([id number]) -------------------------------------------------------------------------------- Physical Exam Details Patient Name: [name], [name] Date of Service: 01/02/2021 3:45 PM Medical Record Number: [id number] Patient Account Number: [id number] Date of Birth/Sex: [birth year] (70 y.o. F) Treating RN: Huel Coventry Primary Care Provider: Olevia Perches Other Clinician: Referring Provider: Olevia Perches Treating Provider/Extender: Allen Derry Weeks in Treatment: 4 Constitutional Well-nourished and well-hydrated in no acute distress. Respiratory normal breathing without difficulty. Psychiatric this patient is able to make decisions and demonstrates good insight into disease process. Alert and Oriented x 3. pleasant and cooperative. Notes Patient's wound bed again appear to be much more dry with a lot of new tissue growth which was great news. Again this is over a scar tissue  area where there was previously surgical incisions due to C-section which she had x3. Again this means is can to be very slow to heal but nonetheless is making progress. Electronic Signature(s) Signed:  01/02/2021 4:40:18 PM By: Lenda Kelp PA-C Entered By: Lenda Kelp on 01/02/2021 16:40:18 [name], [name] ([id number]) -------------------------------------------------------------------------------- Physician Orders Details Patient Name: [name], [name] Date of Service: 01/02/2021 3:45 PM Medical Record Number: [id number] Patient Account Number: [id number] Date of Birth/Sex: [birth year] (70 y.o. F) Treating RN: Angelina Pih Primary Care Provider: Olevia Perches Other Clinician: Huel Coventry Referring Provider: Olevia Perches Treating Provider/Extender: Rowan Blase in Treatment: 4 Verbal / Phone Orders: No Diagnosis Coding ICD-10 Coding Code Description B35.4 Tinea corporis L98.492 Non-pressure chronic ulcer of skin of other sites with fat layer exposed G30.8 Other Alzheimer's disease I10 Essential (primary) hypertension Follow-up Appointments o Return Appointment in 2 weeks. Bathing/ Shower/ Hygiene o May shower; gently cleanse wound with antibacterial soap, rinse and pat dry prior to dressing wounds Wound Treatment Wound #1 - Abdomen - midline Primary Dressing: Silvercel Small 2x2 (in/in) 1 x Per Day/30 Days Discharge Instructions: Apply Silvercel Small 2x2 (in/in) as instructed Secured With: Tegaderm Film 4x4 (in/in) 1 x Per Day/30 Days Discharge Instructions: Apply to wound bed Electronic Signature(s) Signed: 01/02/2021 4:23:48 PM By: Elliot Gurney, BSN, RN, CWS, Kim RN, BSN Signed: 01/02/2021 5:38:58 PM By: Lenda Kelp PA-C Entered By: Elliot Gurney, BSN, RN, CWS, Kim on 01/02/2021 16:23:48 [name], [name] ([id number]) -------------------------------------------------------------------------------- Problem List Details Patient Name: [name], [name] Date of Service:  01/02/2021 3:45 PM Medical Record Number: [id number] Patient Account Number: [id number] Date of Birth/Sex: 02/09/1950 (70 y.o. F) Treating RN: Huel Coventry Primary Care Provider: Olevia Perches Other Clinician: Referring Provider: Olevia Perches Treating Provider/Extender: Allen Derry Weeks in Treatment: 4 Active Problems ICD-10 Encounter Code Description Active Date MDM Diagnosis B35.4 Tinea corporis 12/01/2020 No Yes L98.492 Non-pressure chronic ulcer of skin of other sites with fat layer exposed 12/01/2020 No Yes G30.8 Other Alzheimer's disease 12/01/2020 No Yes I10 Essential (primary) hypertension 12/01/2020 No Yes Inactive Problems Resolved Problems Electronic Signature(s) Signed: 01/02/2021 4:24:46 PM By: Elliot Gurney, BSN, RN, CWS, Kim RN, BSN Signed: 01/02/2021 5:38:58 PM By: Lenda Kelp PA-C Previous Signature: 01/02/2021 3:52:37 PM Version By: Lenda Kelp PA-C Entered By: Elliot Gurney, BSN, RN, CWS, Kim on 01/02/2021 16:24:46 [place], [name] ([id number]) -------------------------------------------------------------------------------- Progress Note Details Patient Name: [name], [name] Date of Service: 01/02/2021 3:45 PM Medical Record Number: [id number] Patient Account Number: [id number] Date of Birth/Sex: 04-18-1950 (70 y.o. F) Treating RN: Huel Coventry Primary Care Provider: Olevia Perches Other Clinician: Referring Provider: Olevia Perches Treating Provider/Extender: Rowan Blase in Treatment: 4 Subjective Chief Complaint Information obtained from Patient Midline abdominal ulcer History of Present Illness (HPI) 12/01/2020 upon evaluation today patient presents for initial inspection here in the clinic concerning a wound over the midline lower abdominal region which is underneath her pannus. Subsequently she has been using Lotrisone and nystatin powder in general over the area but then subsequently had a region here in particular where she had a larger area of skin breakdown  I think this is simply due to the skin contact to the opposite side skin and moisture trapping underneath. It does have a very distinctive ringworm type appearance with a central clearing and erythematous border around and then clear outside. She has a history of Alzheimer's and hypertension but otherwise no major medical problems. 12/12/2020 upon evaluation today patient appears to be doing a little bit more poorly in regard to her wound. There is some evidence of bruising where the tape from the dressing seems to be sticking to her skin causing some trouble here. I think we may  want to switch things up from a dressing standpoint to some degree here. 12/19/2020 upon evaluation today patient's wound is actually showing signs of significant improvement. I am actually extremely pleased with where we stand this week. The patient is likewise very happy with where we are. There is no signs of active infection which is also great news. No fevers, chills, nausea, vomiting, or diarrhea. 01/02/2021 upon evaluation today patient appears to be doing well with regard to her wound. This is actually measuring significantly smaller which is great news and overall I think we are getting very close to resolution. I do not see any signs of active infection at this time. Objective Constitutional Well-nourished and well-hydrated in no acute distress. Vitals Time Taken: 3:50 PM, Height: 64 in, Weight: 172 lbs, BMI: 29.5, Temperature: 97.7 F, Pulse: 91 bpm, Respiratory Rate: 18 breaths/min, Blood Pressure: 137/76 mmHg. Respiratory normal breathing without difficulty. Psychiatric this patient is able to make decisions and demonstrates good insight into disease process. Alert and Oriented x 3. pleasant and cooperative. General Notes: Patient's wound bed again appear to be much more dry with a lot of new tissue growth which was great news. Again this is over a scar tissue area where there was previously surgical  incisions due to C-section which she had x3. Again this means is can to be very slow to heal but nonetheless is making progress. Integumentary (Hair, Skin) Wound #1 status is Open. Original cause of wound was Gradually Appeared. The date acquired was: 09/01/2020. The wound has been in treatment 4 weeks. The wound is located on the Abdomen - midline. The wound measures 1.2cm length x 0.9cm width x 0.1cm depth; 0.848cm^2 area and 0.085cm^3 volume. There is Fat Layer (Subcutaneous Tissue) exposed. There is no tunneling or undermining noted. There is a medium amount of serous drainage noted. There is medium (34-66%) red granulation within the wound bed. There is a small (1-33%) amount of necrotic tissue within the wound bed including Adherent Slough. Assessment [name], [name] VERRY ([id number]) Active Problems ICD-10 Tinea corporis Non-pressure chronic ulcer of skin of other sites with fat layer exposed Other Alzheimer's disease Essential (primary) hypertension Plan Follow-up Appointments: Return Appointment in 2 weeks. Bathing/ Shower/ Hygiene: May shower; gently cleanse wound with antibacterial soap, rinse and pat dry prior to dressing wounds WOUND #1: - Abdomen - midline Wound Laterality: Primary Dressing: Silvercel Small 2x2 (in/in) 1 x Per Day/30 Days Discharge Instructions: Apply Silvercel Small 2x2 (in/in) as instructed Secured With: Tegaderm Film 4x4 (in/in) 1 x Per Day/30 Days Discharge Instructions: Apply to wound bed 1. Would recommend currently that we going continue with the wound care measures as before and the patient is in agreement with plan. This includes the use of the silver alginate dressing which I think is doing a good job using Tegaderm over top of this. 2. I am also can recommend that the patient could use some of the Interdry wicking material in order to help with the other crease regions. They actually have some of this at home and that they can give a trial. We will  see patient back for reevaluation in 1 week here in the clinic. If anything worsens or changes patient will contact our office for additional recommendations. Electronic Signature(s) Signed: 01/02/2021 4:40:51 PM By: Lenda Kelp PA-C Entered By: Lenda Kelp on 01/02/2021 16:40:51 Kras, [name] ([id number]) -------------------------------------------------------------------------------- SuperBill Details Patient Name: Robling, Shaundra L. Date of Service: 01/02/2021 Medical Record Number: [id number] Patient Account Number: [id number] Date  of Birth/Sex: 05-Nov-1950 (70 y.o. F) Treating RN: Huel Coventry Primary Care Provider: Olevia Perches Other Clinician: Referring Provider: Olevia Perches Treating Provider/Extender: Allen Derry Weeks in Treatment: 4 Diagnosis Coding ICD-10 Codes Code Description B35.4 Tinea corporis L98.492 Non-pressure chronic ulcer of skin of other sites with fat layer exposed G30.8 Other Alzheimer's disease I10 Essential (primary) hypertension Facility Procedures CPT4 Code: [id number] Description: [phone] - WOUND CARE VISIT-LEV 2 EST PT Modifier: Quantity: 1 Physician Procedures CPT4 Code: [id number] Description: 99213 - WC PHYS LEVEL 3 - EST PT Modifier: Quantity: 1 CPT4 Code: Description: ICD-10 Diagnosis Description B35.4 Tinea corporis L98.492 Non-pressure chronic ulcer of skin of other sites with fat layer expos G30.8 Other Alzheimer's disease I10 Essential (primary) hypertension Modifier: ed Quantity: Electronic Signature(s) Signed: 01/02/2021 4:41:13 PM By: Lenda Kelp PA-C Previous Signature: 01/02/2021 4:24:12 PM Version By: Elliot Gurney, BSN, RN, CWS, Kim RN, BSN Entered By: Lenda Kelp on 01/02/2021 16:41:13

## 2021-01-14 ENCOUNTER — Other Ambulatory Visit: Payer: Self-pay | Admitting: Family Medicine

## 2021-01-15 ENCOUNTER — Telehealth: Payer: Self-pay | Admitting: *Deleted

## 2021-01-15 NOTE — Telephone Encounter (Signed)
Called [facility] to clarify when last rx was written for the Vistaril 25 mg.   Last one was 05/22/2020 so they need a new rx.  I sent to [facility] because no refills were remaining.   Needs new Rx.

## 2021-01-15 NOTE — Telephone Encounter (Signed)
Requested Prescriptions  Pending Prescriptions Disp Refills   hydrOXYzine (ATARAX) 25 MG tablet [Pharmacy Med Name: HYDROXYZINE HCL 25 MG TABLET] 90 tablet 0    Sig: Take 1 tablet (25 mg total) by mouth 3 (three) times daily as needed.     Ear, Nose, and Throat:  Antihistamines Passed - 01/14/2021 11:57 AM      Passed - Valid encounter within last 12 months    Recent Outpatient Visits          1 month ago Essential (primary) hypertension   [facility] [place], [name], DO   2 months ago [facility]   [employer] [facility], Jake Church, NP   3 months ago [facility]   [employer], Lauren A, NP   4 months ago Alzheimer's dementia without behavioral disturbance, unspecified timing of dementia onset (HCC)   [facility] Johnson, [name], DO   7 months ago Routine general medical examination at a health care facility   [facility] [place], Oralia Rud, DO      Future Appointments            In 1 month Johnson, Oralia Rud, DO [facility], [facility]   In 3 months  [facility], [facility]

## 2021-01-16 ENCOUNTER — Encounter: Payer: Medicare PPO | Admitting: Physician Assistant

## 2021-01-16 ENCOUNTER — Other Ambulatory Visit: Payer: Self-pay

## 2021-01-16 DIAGNOSIS — L98492 Non-pressure chronic ulcer of skin of other sites with fat layer exposed: Secondary | ICD-10-CM | POA: Diagnosis not present

## 2021-01-16 DIAGNOSIS — G308 Other Alzheimer's disease: Secondary | ICD-10-CM | POA: Diagnosis not present

## 2021-01-16 DIAGNOSIS — B354 Tinea corporis: Secondary | ICD-10-CM | POA: Diagnosis not present

## 2021-01-16 DIAGNOSIS — I10 Essential (primary) hypertension: Secondary | ICD-10-CM | POA: Diagnosis not present

## 2021-01-16 NOTE — Progress Notes (Addendum)
ARDELLA, CHHIM ([id number]) Visit Report for 01/16/2021 Chief Complaint Document Details Patient Name: [name] [name], [name] Date of Service: 01/16/2021 3:15 PM Medical Record Number: [id number] Patient Account Number: [id number] Date of Birth/Sex: [birth year] (70 y.o. F) Treating RN: Hansel Feinstein Primary Care Provider: Olevia Perches Other Clinician: Referring Provider: Olevia Perches Treating Provider/Extender: Rowan Blase in Treatment: 6 Information Obtained from: Patient Chief Complaint Midline abdominal ulcer Electronic Signature(s) Signed: 01/16/2021 4:06:22 PM By: Lenda Kelp PA-C Entered By: Lenda Kelp on 01/16/2021 16:06:22 Lambe, [name] [name] ([id number]) -------------------------------------------------------------------------------- HPI Details Patient Name: [name] [name], [name] Date of Service: 01/16/2021 3:15 PM Medical Record Number: [id number] Patient Account Number: [id number] Date of Birth/Sex: [birth year] (70 y.o. F) Treating RN: Hansel Feinstein Primary Care Provider: Olevia Perches Other Clinician: Referring Provider: Olevia Perches Treating Provider/Extender: Rowan Blase in Treatment: 6 History of Present Illness HPI Description: 12/01/2020 upon evaluation today patient presents for initial inspection here in the clinic concerning a wound over the midline lower abdominal region which is underneath her pannus. Subsequently she has been using Lotrisone and nystatin powder in general over the area but then subsequently had a region here in particular where she had a larger area of skin breakdown I think this is simply due to the skin contact to the opposite side skin and moisture trapping underneath. It does have a very distinctive ringworm type appearance with a central clearing and erythematous border around and then clear outside. She has a history of Alzheimer's and hypertension but otherwise no major medical problems. 12/12/2020 upon evaluation today patient  appears to be doing a little bit more poorly in regard to her wound. There is some evidence of bruising where the tape from the dressing seems to be sticking to her skin causing some trouble here. I think we may want to switch things up from a dressing standpoint to some degree here. 12/19/2020 upon evaluation today patient's wound is actually showing signs of significant improvement. I am actually extremely pleased with where we stand this week. The patient is likewise very happy with where we are. There is no signs of active infection which is also great news. No fevers, chills, nausea, vomiting, or diarrhea. 01/02/2021 upon evaluation today patient appears to be doing well with regard to her wound. This is actually measuring significantly smaller which is great news and overall I think we are getting very close to resolution. I do not see any signs of active infection at this time. 01/16/2021 upon evaluation today patient appears to be doing well currently in regard to her wound. In fact this appears to be very close to complete resolution. I do not see any signs of active infection locally nor systemically at this point. Overall I am pleased in that regard. Still there is a lot of scarring right in the center part with this is still open and I think that scar tissue is due to multiple C-sections and her husband states that is always the area that was the hardest to heal whenever she had a C-section 1 time it took as long as 6 months. Electronic Signature(s) Signed: 01/16/2021 5:04:57 PM By: Lenda Kelp PA-C Entered By: Lenda Kelp on 01/16/2021 17:04:56 [name] [name], [name] [name] ([id number]) -------------------------------------------------------------------------------- Physical Exam Details Patient Name: Shibley, Rosezetta L. Date of Service: 01/16/2021 3:15 PM Medical Record Number: [id number] Patient Account Number: [id number] Date of Birth/Sex: [birth year] (70 y.o. F) Treating RN: Hansel Feinstein Primary Care Provider: Olevia Perches Other Clinician: Referring Provider: Olevia Perches  Treating Provider/Extender: Allen Derry Weeks in Treatment: 6 Constitutional Well-nourished and well-hydrated in no acute distress. Respiratory normal breathing without difficulty. Psychiatric this patient is able to make decisions and demonstrates good insight into disease process. Alert and Oriented x 3. pleasant and cooperative. Notes Patient's wound bed showed signs of good granulation epithelization at this point. Not see any signs of infection which is great news.No sharp debridement was necessary. Electronic Signature(s) Signed: 01/16/2021 5:05:34 PM By: Lenda Kelp PA-C Entered By: Lenda Kelp on 01/16/2021 17:05:34 [name] [name], [name] [name] ([id number]) -------------------------------------------------------------------------------- Physician Orders Details Patient Name: Reddin, Silvia L. Date of Service: 01/16/2021 3:15 PM Medical Record Number: [id number] Patient Account Number: [id number] Date of Birth/Sex: 08/31/1950 (70 y.o. F) Treating RN: Yevonne Pax Primary Care Provider: Olevia Perches Other Clinician: Referring Provider: Olevia Perches Treating Provider/Extender: Rowan Blase in Treatment: 6 Verbal / Phone Orders: No Diagnosis Coding ICD-10 Coding Code Description B35.4 Tinea corporis L98.492 Non-pressure chronic ulcer of skin of other sites with fat layer exposed G30.8 Other Alzheimer's disease I10 Essential (primary) hypertension Follow-up Appointments o Return Appointment in 3 weeks. Bathing/ Shower/ Hygiene o May shower; gently cleanse wound with antibacterial soap, rinse and pat dry prior to dressing wounds Wound Treatment Wound #1 - Abdomen - midline Primary Dressing: Silvercel Small 2x2 (in/in) 1 x Per Day/30 Days Discharge Instructions: Apply Silvercel Small 2x2 (in/in) as instructed Secured With: Tegaderm Film 4x4 (in/in) 1 x Per Day/30  Days Discharge Instructions: Apply to wound bed Electronic Signature(s) Signed: 01/16/2021 4:49:51 PM By: Yevonne Pax RN Signed: 01/16/2021 5:41:04 PM By: Lenda Kelp PA-C Entered By: Yevonne Pax on 01/16/2021 16:14:52 [name] [name], [name] [name] ([id number]) -------------------------------------------------------------------------------- Problem List Details Patient Name: [name] [name], [name] Date of Service: 01/16/2021 3:15 PM Medical Record Number: [id number] Patient Account Number: [id number] Date of Birth/Sex: [birth year] (70 y.o. F) Treating RN: Hansel Feinstein Primary Care Provider: Olevia Perches Other Clinician: Referring Provider: Olevia Perches Treating Provider/Extender: Allen Derry Weeks in Treatment: 6 Active Problems ICD-10 Encounter Code Description Active Date MDM Diagnosis B35.4 Tinea corporis 12/01/2020 No Yes L98.492 Non-pressure chronic ulcer of skin of other sites with fat layer exposed 12/01/2020 No Yes G30.8 Other Alzheimer's disease 12/01/2020 No Yes I10 Essential (primary) hypertension 12/01/2020 No Yes Inactive Problems Resolved Problems Electronic Signature(s) Signed: 01/16/2021 4:06:15 PM By: Lenda Kelp PA-C Entered By: Lenda Kelp on 01/16/2021 16:06:14 [name] [name], [name] [name] ([id number]) -------------------------------------------------------------------------------- Progress Note Details Patient Name: [name] [name], [name] Date of Service: 01/16/2021 3:15 PM Medical Record Number: [id number] Patient Account Number: [id number] Date of Birth/Sex: 1950/03/13 (70 y.o. F) Treating RN: Hansel Feinstein Primary Care Provider: Olevia Perches Other Clinician: Referring Provider: Olevia Perches Treating Provider/Extender: Rowan Blase in Treatment: 6 Subjective Chief Complaint Information obtained from Patient Midline abdominal ulcer History of Present Illness (HPI) 12/01/2020 upon evaluation today patient presents for initial inspection here in the clinic concerning  a wound over the midline lower abdominal region which is underneath her pannus. Subsequently she has been using Lotrisone and nystatin powder in general over the area but then subsequently had a region here in particular where she had a larger area of skin breakdown I think this is simply due to the skin contact to the opposite side skin and moisture trapping underneath. It does have a very distinctive ringworm type appearance with a central clearing and erythematous border around and then clear outside. She has a history of Alzheimer's and hypertension but otherwise no major medical problems. 12/12/2020 upon evaluation today patient appears  to be doing a little bit more poorly in regard to her wound. There is some evidence of bruising where the tape from the dressing seems to be sticking to her skin causing some trouble here. I think we may want to switch things up from a dressing standpoint to some degree here. 12/19/2020 upon evaluation today patient's wound is actually showing signs of significant improvement. I am actually extremely pleased with where we stand this week. The patient is likewise very happy with where we are. There is no signs of active infection which is also great news. No fevers, chills, nausea, vomiting, or diarrhea. 01/02/2021 upon evaluation today patient appears to be doing well with regard to her wound. This is actually measuring significantly smaller which is great news and overall I think we are getting very close to resolution. I do not see any signs of active infection at this time. 01/16/2021 upon evaluation today patient appears to be doing well currently in regard to her wound. In fact this appears to be very close to complete resolution. I do not see any signs of active infection locally nor systemically at this point. Overall I am pleased in that regard. Still there is a lot of scarring right in the center part with this is still open and I think that scar tissue is  due to multiple C-sections and her husband states that is always the area that was the hardest to heal whenever she had a C-section 1 time it took as long as 6 months. Objective Constitutional Well-nourished and well-hydrated in no acute distress. Vitals Time Taken: 3:40 PM, Height: 64 in, Weight: 172 lbs, BMI: 29.5, Temperature: 98.2 F, Pulse: 86 bpm, Respiratory Rate: 18 breaths/min, Blood Pressure: 138/84 mmHg. Respiratory normal breathing without difficulty. Psychiatric this patient is able to make decisions and demonstrates good insight into disease process. Alert and Oriented x 3. pleasant and cooperative. General Notes: Patient's wound bed showed signs of good granulation epithelization at this point. Not see any signs of infection which is great news.No sharp debridement was necessary. Integumentary (Hair, Skin) Wound #1 status is Open. Original cause of wound was Gradually Appeared. The date acquired was: 09/01/2020. The wound has been in treatment 6 weeks. The wound is located on the Abdomen - midline. The wound measures 0.2cm length x 0.3cm width x 0.1cm depth; 0.047cm^2 area and 0.005cm^3 volume. There is Fat Layer (Subcutaneous Tissue) exposed. There is no tunneling or undermining noted. There is a medium amount of serous drainage noted. There is medium (34-66%) red granulation within the wound bed. There is no necrotic tissue within the wound bed. [name] [name], [name] [name] ([id number]) Assessment Active Problems ICD-10 Tinea corporis Non-pressure chronic ulcer of skin of other sites with fat layer exposed Other Alzheimer's disease Essential (primary) hypertension Plan Follow-up Appointments: Return Appointment in 3 weeks. Bathing/ Shower/ Hygiene: May shower; gently cleanse wound with antibacterial soap, rinse and pat dry prior to dressing wounds WOUND #1: - Abdomen - midline Wound Laterality: Primary Dressing: Silvercel Small 2x2 (in/in) 1 x Per Day/30 Days Discharge  Instructions: Apply Silvercel Small 2x2 (in/in) as instructed Secured With: Tegaderm Film 4x4 (in/in) 1 x Per Day/30 Days Discharge Instructions: Apply to wound bed 1. Would recommend currently that we go ahead and continue with the silver alginate dressing which I think is doing awesome job. 2. I am also can recommend that we have the patient continue with the Tegaderm to secure in place. 3. I am also can recommend that we have the  patient continue with the regular follow-ups we will get a do a 1 week follow-up but she had something going on next week that would not work out. For that reason we can actually initially get a do a 2-week follow-up but then that also really was not good for them based on scheduling so in the end we settled on 3 weeks hopefully by that time will be completely healed. We will see patient back for reevaluation in 1 week here in the clinic. If anything worsens or changes patient will contact our office for additional recommendations. Electronic Signature(s) Signed: 01/16/2021 5:06:32 PM By: Lenda Kelp PA-C Entered By: Lenda Kelp on 01/16/2021 17:06:32 Hansman, [name] [name] ([id number]) -------------------------------------------------------------------------------- SuperBill Details Patient Name: [name] [name], [name] Date of Service: 01/16/2021 Medical Record Number: [id number] Patient Account Number: [id number] Date of Birth/Sex: 1950-04-27 (70 y.o. F) Treating RN: Yevonne Pax Primary Care Provider: Olevia Perches Other Clinician: Referring Provider: Olevia Perches Treating Provider/Extender: Rowan Blase in Treatment: 6 Diagnosis Coding ICD-10 Codes Code Description B35.4 Tinea corporis L98.492 Non-pressure chronic ulcer of skin of other sites with fat layer exposed G30.8 Other Alzheimer's disease I10 Essential (primary) hypertension Facility Procedures CPT4 Code: [id number] Description: 99213 - WOUND CARE VISIT-LEV 3 EST PT Modifier: Quantity:  1 Physician Procedures CPT4 Code: [id number] Description: 99213 - WC PHYS LEVEL 3 - EST PT Modifier: Quantity: 1 CPT4 Code: Description: ICD-10 Diagnosis Description B35.4 Tinea corporis L98.492 Non-pressure chronic ulcer of skin of other sites with fat layer expos G30.8 Other Alzheimer's disease I10 Essential (primary) hypertension Modifier: ed Quantity: Electronic Signature(s) Signed: 01/16/2021 5:06:52 PM By: Lenda Kelp PA-C Previous Signature: 01/16/2021 4:49:51 PM Version By: Yevonne Pax RN Entered By: Lenda Kelp on 01/16/2021 17:06:52

## 2021-01-16 NOTE — Progress Notes (Signed)
ARLENE, BRICKEL ([id number]) Visit Report for 01/16/2021 Arrival Information Details Patient Name: [name] [name], [name] [name]. Date of Service: 01/16/2021 3:15 PM Medical Record Number: [id number] Patient Account Number: [id number] Date of Birth/Sex: [birth year] (70 y.o. F) Treating RN: Yevonne Pax Primary Care [place]: Olevia Perches Other Clinician: Referring Ambika Zettlemoyer: Olevia Perches Treating Feiga Nadel/Extender: Rowan Blase in Treatment: 6 Visit Information History Since Last Visit All ordered tests and consults were completed: No Patient Arrived: Ambulatory Added or deleted any medications: No Arrival Time: 15:36 Any new allergies or adverse reactions: No Accompanied By: husband Had a fall or experienced change in No Transfer Assistance: None activities of daily living that may affect Patient Identification Verified: Yes risk of falls: Secondary Verification Process Completed: Yes Signs or symptoms of abuse/neglect since last visito No Patient Requires Transmission-Based Precautions: No Hospitalized since last visit: No Patient Has Alerts: No Implantable device outside of the clinic excluding No cellular tissue based products placed in the center since last visit: Has Dressing in Place as Prescribed: Yes Pain Present Now: No Electronic Signature(s) Signed: 01/16/2021 4:49:51 PM By: Yevonne Pax RN Entered By: Yevonne Pax on 01/16/2021 15:43:38 Martello, [name] [name] ([id number]) -------------------------------------------------------------------------------- Clinic Level of Care Assessment Details Patient Name: [name] [name], [name] Date of Service: 01/16/2021 3:15 PM Medical Record Number: [id number] Patient Account Number: [id number] Date of Birth/Sex: 29-Jul-1950 (70 y.o. F) Treating RN: Yevonne Pax Primary Care Belinda Bringhurst: Olevia Perches Other Clinician: Referring Jermani Pund: Olevia Perches Treating Dorethea Strubel/Extender: Rowan Blase in Treatment: 6 Clinic Level of Care Assessment  Items TOOL 4 Quantity Score X - Use when only an EandM is performed on FOLLOW-UP visit 1 0 ASSESSMENTS - Nursing Assessment / Reassessment X - Reassessment of Co-morbidities (includes updates in patient status) 1 10 X- 1 5 Reassessment of Adherence to Treatment Plan ASSESSMENTS - Wound and Skin Assessment / Reassessment X - Simple Wound Assessment / Reassessment - one wound 1 5 []  - 0 Complex Wound Assessment / Reassessment - multiple wounds []  - 0 Dermatologic / Skin Assessment (not related to wound area) ASSESSMENTS - Focused Assessment []  - Circumferential Edema Measurements - multi extremities 0 []  - 0 Nutritional Assessment / Counseling / Intervention []  - 0 Lower Extremity Assessment (monofilament, tuning fork, pulses) []  - 0 Peripheral Arterial Disease Assessment (using hand held doppler) ASSESSMENTS - Ostomy and/or Continence Assessment and Care []  - Incontinence Assessment and Management 0 []  - 0 Ostomy Care Assessment and Management (repouching, etc.) PROCESS - Coordination of Care X - Simple Patient / Family Education for ongoing care 1 15 []  - 0 Complex (extensive) Patient / Family Education for ongoing care X- 1 10 Staff obtains , Records, Test Results / Process Orders []  - 0 Staff telephones HHA, Nursing Homes / Clarify orders / etc []  - 0 Routine Transfer to another Facility (non-emergent condition) []  - 0 Routine Hospital Admission (non-emergent condition) []  - 0 New Admissions / / Ordering NPWT, Apligraf, etc. []  - 0 Emergency Hospital Admission (emergent condition) X- 1 10 Simple Discharge Coordination []  - 0 Complex (extensive) Discharge Coordination PROCESS - Special Needs []  - Pediatric / Minor Patient Management 0 []  - 0 Isolation Patient Management []  - 0 Hearing / Language / Visual special needs []  - 0 Assessment of Community assistance (transportation, D/C planning, etc.) []  - 0 Additional assistance /  Altered mentation []  - 0 Support Surface(s) Assessment (bed, cushion, seat, etc.) INTERVENTIONS - Wound Cleansing / Measurement [name] [name], [name] ( ) X- 1 5 Simple Wound Cleansing -  one wound []  - 0 Complex Wound Cleansing - multiple wounds X- 1 5 Wound Imaging (photographs - any number of wounds) []  - 0 Wound Tracing (instead of photographs) X- 1 5 Simple Wound Measurement - one wound []  - 0 Complex Wound Measurement - multiple wounds INTERVENTIONS - Wound Dressings X - Small Wound Dressing one or multiple wounds 1 10 []  - 0 Medium Wound Dressing one or multiple wounds []  - 0 Large Wound Dressing one or multiple wounds X- 1 5 Application of Medications - topical []  - 0 Application of Medications - injection INTERVENTIONS - Miscellaneous []  - External ear exam 0 []  - 0 Specimen Collection (cultures, biopsies, blood, body fluids, etc.) []  - 0 Specimen(s) / Culture(s) sent or taken to Lab for analysis []  - 0 Patient Transfer (multiple staff / / Similar devices) []  - 0 Simple Staple / Suture removal (25 or less) []  - 0 Complex Staple / Suture removal (26 or more) []  - 0 Hypo / Hyperglycemic Management (close monitor of Blood Glucose) []  - 0 Ankle / Brachial Index (ABI) - do not check if billed separately X- 1 5 Vital Signs Has the patient been seen at the hospital within the last three years: Yes Total Score: 90 Level Of Care: New/Established - Level 3 Electronic Signature(s) Signed: 01/16/2021 4:49:51 PM By: RN Entered By: on 01/16/2021 16:15:18 [name] [name], ( ) -------------------------------------------------------------------------------- Encounter Discharge Information Details Patient Name: [name] [name], [name] Date of Service: 01/16/2021 3:15 PM Medical Record Number: Patient Account Number: Date of Birth/Sex: [birth year] (70 y.o. F) Treating RN: Primary Care Lief Palmatier:  Other Clinician: Referring Abubakr Wieman: Treating Lawanna Cecere/Extender: 01/18/2021 in Treatment: 6 Encounter Discharge Information Items Discharge Condition: Stable Ambulatory Status: Ambulatory Discharge Destination: Home Transportation: Private Auto Accompanied By: husband Schedule Follow-up Appointment: Yes Clinical Summary of Care: Patient Declined Electronic Signature(s) Signed: 01/16/2021 4:49:51 PM By: Yevonne Pax RN Entered By: 01/18/2021 on 01/16/2021 16:16:17 Ly, [id number] (01/18/2021) -------------------------------------------------------------------------------- Lower Extremity Assessment Details Patient Name: [name] [name], [name] Date of Service: 01/16/2021 3:15 PM Medical Record Number: [id number] Patient Account Number: 06/21/1950 Date of Birth/Sex: 1950-05-07 (70 y.o. F) Treating RN: Olevia Perches Primary Care Kaymon Denomme: Olevia Perches Other Clinician: Referring Tyarra Nolton: Rowan Blase Treating Dietrich Ke/Extender: 01/18/2021 Weeks in Treatment: 6 Electronic Signature(s) Signed: 01/16/2021 4:49:51 PM By: Yevonne Pax RN Entered By: 01/18/2021 on 01/16/2021 15:45:52 Sova, [id number] (01/18/2021) -------------------------------------------------------------------------------- Multi Wound Chart Details Patient Name: [name] [name], [name] Date of Service: 01/16/2021 3:15 PM Medical Record Number: [id number] Patient Account Number: 06/21/1950 Date of Birth/Sex: 02-24-1950 (70 y.o. F) Treating RN: Olevia Perches Primary Care Chiquitta Matty: Olevia Perches Other Clinician: Referring Brynli Ollis: Allen Derry Treating Justa Hatchell/Extender: 01/18/2021 in Treatment: 6 Vital Signs Height(in): 64 Pulse(bpm): 86 Weight(lbs): 172 Blood Pressure(mmHg): 138/84 Body Mass Index(BMI): 30 Temperature(F): 98.2 Respiratory Rate(breaths/min): 18 Photos: [N/A:N/A] Wound Location: Abdomen - midline N/A N/A Wounding Event: Gradually Appeared N/A  N/A Primary Etiology: Fungal N/A N/A Comorbid History: Hypertension N/A N/A Date Acquired: 09/01/2020 N/A N/A Weeks of Treatment: 6 N/A N/A Wound Status: Open N/A N/A Measurements L x W x D (cm) 0.2x0.3x0.1 N/A N/A Area (cm) : 0.047 N/A N/A Volume (cm) : 0.005 N/A N/A % Reduction in Area: 96.10% N/A N/A % Reduction in Volume: 97.90% N/A N/A Classification: Full Thickness Without Exposed N/A N/A Support Structures Exudate Amount: Medium N/A N/A Exudate Type: Serous N/A N/A Exudate Color: amber N/A N/A  Granulation Amount: Medium (34-66%) N/A N/A Granulation Quality: Red N/A N/A Necrotic Amount: None Present (0%) N/A N/A Exposed Structures: Fat Layer (Subcutaneous Tissue): N/A N/A Yes Fascia: No Tendon: No Muscle: No Joint: No Bone: No Epithelialization: None N/A N/A Treatment Notes Electronic Signature(s) Signed: 01/16/2021 4:49:51 PM By: Yevonne Pax RN Entered By: Yevonne Pax on 01/16/2021 16:14:18 [name] [name], [name] [name] ([id number]) -------------------------------------------------------------------------------- Multi-Disciplinary Care Plan Details Patient Name: [name] [name], [name] Date of Service: 01/16/2021 3:15 PM Medical Record Number: [id number] Patient Account Number: [id number] Date of Birth/Sex: 1950/11/26 (70 y.o. F) Treating RN: Yevonne Pax Primary Care Jaydrien Wassenaar: Olevia Perches Other Clinician: Referring Murrel Freet: Olevia Perches Treating Jamine Wingate/Extender: Rowan Blase in Treatment: 6 Active Inactive Nutrition Nursing Diagnoses: Potential for alteratiion in Nutrition/Potential for imbalanced nutrition Goals: Patient/caregiver agrees to and verbalizes understanding of need to use nutritional supplements and/or vitamins as prescribed Date Initiated: 12/01/2020 Target Resolution Date: 12/31/2020 Goal Status: Active Interventions: Assess patient nutrition upon admission and as needed per policy Notes: Wound/Skin Impairment Nursing Diagnoses: Knowledge  deficit related to ulceration/compromised skin integrity Goals: Patient will have a decrease in wound volume by X% from date: (specify in notes) Date Initiated: 12/01/2020 Target Resolution Date: 12/31/2020 Goal Status: Active Patient/caregiver will verbalize understanding of skin care regimen Date Initiated: 12/01/2020 Target Resolution Date: 01/30/2021 Goal Status: Active Ulcer/skin breakdown will have a volume reduction of 30% by week 4 Date Initiated: 12/01/2020 Target Resolution Date: 03/02/2021 Goal Status: Active Ulcer/skin breakdown will have a volume reduction of 50% by week 8 Date Initiated: 12/01/2020 Target Resolution Date: 03/31/2021 Goal Status: Active Ulcer/skin breakdown will have a volume reduction of 80% by week 12 Date Initiated: 12/01/2020 Target Resolution Date: 04/28/2021 Goal Status: Active Ulcer/skin breakdown will heal within 14 weeks Date Initiated: 12/01/2020 Target Resolution Date: 05/29/2021 Goal Status: Active Interventions: Assess patient/caregiver ability to obtain necessary supplies Assess patient/caregiver ability to perform ulcer/skin care regimen upon admission and as needed Assess ulceration(s) every visit Notes: Electronic Signature(s) Signed: 01/16/2021 4:49:51 PM By: Yevonne Pax RN Entered By: Yevonne Pax on 01/16/2021 16:09:38 [name] [name], [name] [name] ([id number]) -------------------------------------------------------------------------------- Pain Assessment Details Patient Name: [name] [name], [name] Date of Service: 01/16/2021 3:15 PM Medical Record Number: [id number] Patient Account Number: [id number] Date of Birth/Sex: May 30, 1950 (70 y.o. F) Treating RN: Yevonne Pax Primary Care Kairon Shock: Olevia Perches Other Clinician: Referring Benetta Maclaren: Olevia Perches Treating Jobin Montelongo/Extender: Rowan Blase in Treatment: 6 Active Problems Location of Pain Severity and Description of Pain Patient Has Paino No Site Locations Pain Management and  Medication Current Pain Management: Electronic Signature(s) Signed: 01/16/2021 4:49:51 PM By: Yevonne Pax RN Entered By: Yevonne Pax on 01/16/2021 15:44:05 Janusz, [name] [name] ([id number]) -------------------------------------------------------------------------------- Patient/Caregiver Education Details Patient Name: [name] [name], [name] Date of Service: 01/16/2021 3:15 PM Medical Record Number: [id number] Patient Account Number: [id number] Date of Birth/Gender: [birth year] (70 y.o. F) Treating RN: Yevonne Pax Primary Care Physician: Olevia Perches Other Clinician: Referring Physician: Olevia Perches Treating Physician/Extender: Rowan Blase in Treatment: 6 Education Assessment Education Provided To: Patient Education Topics Provided Wound/Skin Impairment: Methods: Explain/Verbal Responses: State content correctly Electronic Signature(s) Signed: 01/16/2021 4:49:51 PM By: Yevonne Pax RN Entered By: Yevonne Pax on 01/16/2021 16:15:37 Klose, [name] [name] ([id number]) -------------------------------------------------------------------------------- Wound Assessment Details Patient Name: Sontag, Laronica L. Date of Service: 01/16/2021 3:15 PM Medical Record Number: [id number] Patient Account Number: [id number] Date of Birth/Sex: 1951-01-30 (70 y.o. F) Treating RN: Yevonne Pax Primary Care Haylen Bellotti: Olevia Perches Other Clinician: Referring Jehieli Brassell: Olevia Perches Treating Ellory Khurana/Extender: Allen Derry Weeks in Treatment: 6 Wound Status  Wound Number: 1 Primary Etiology: Fungal Wound Location: Abdomen - midline Wound Status: Open Wounding Event: Gradually Appeared Comorbid History: Hypertension Date Acquired: 09/01/2020 Weeks Of Treatment: 6 Clustered Wound: No Photos Wound Measurements Length: (cm) 0.2 Width: (cm) 0.3 Depth: (cm) 0.1 Area: (cm) 0.047 Volume: (cm) 0.005 % Reduction in Area: 96.1% % Reduction in Volume: 97.9% Epithelialization: None Tunneling:  No Undermining: No Wound Description Classification: Full Thickness Without Exposed Support Structu Exudate Amount: Medium Exudate Type: Serous Exudate Color: amber res Foul Odor After Cleansing: No Slough/Fibrino No Wound Bed Granulation Amount: Medium (34-66%) Exposed Structure Granulation Quality: Red Fascia Exposed: No Necrotic Amount: None Present (0%) Fat Layer (Subcutaneous Tissue) Exposed: Yes Tendon Exposed: No Muscle Exposed: No Joint Exposed: No Bone Exposed: No Treatment Notes Wound #1 (Abdomen - midline) Cleanser Peri-Wound Care Topical Primary Dressing Gehrig, [name] [name] ([id number]) Silvercel Small 2x2 (in/in) Discharge Instruction: Apply Silvercel Small 2x2 (in/in) as instructed Secondary Dressing Secured With Tegaderm Film 4x4 (in/in) Discharge Instruction: Apply to wound bed Compression Wrap Compression Stockings Add-Ons Electronic Signature(s) Signed: 01/16/2021 4:49:51 PM By: Yevonne Pax RN Entered By: Yevonne Pax on 01/16/2021 15:45:15 [place], [name] [name] ([id number]) -------------------------------------------------------------------------------- Vitals Details Patient Name: Stanke, Jamiya L. Date of Service: 01/16/2021 3:15 PM Medical Record Number: [id number] Patient Account Number: [id number] Date of Birth/Sex: [birth year] (70 y.o. F) Treating RN: Yevonne Pax Primary Care Pailynn Vahey: Olevia Perches Other Clinician: Referring Zian Mohamed: Olevia Perches Treating Ramonte Mena/Extender: Rowan Blase in Treatment: 6 Vital Signs Time Taken: 15:40 Temperature (F): 98.2 Height (in): 64 Pulse (bpm): 86 Weight (lbs): 172 Respiratory Rate (breaths/min): 18 Body Mass Index (BMI): 29.5 Blood Pressure (mmHg): 138/84 Reference Range: 80 - 120 mg / dl Electronic Signature(s) Signed: 01/16/2021 4:49:51 PM By: Yevonne Pax RN Entered By: Yevonne Pax on 01/16/2021 15:43:57

## 2021-02-06 ENCOUNTER — Other Ambulatory Visit: Payer: Self-pay

## 2021-02-06 ENCOUNTER — Encounter: Payer: Medicare PPO | Attending: Physician Assistant | Admitting: Physician Assistant

## 2021-02-06 DIAGNOSIS — G308 Other Alzheimer's disease: Secondary | ICD-10-CM | POA: Diagnosis not present

## 2021-02-06 DIAGNOSIS — F0280 Dementia in other diseases classified elsewhere without behavioral disturbance: Secondary | ICD-10-CM | POA: Insufficient documentation

## 2021-02-06 DIAGNOSIS — I10 Essential (primary) hypertension: Secondary | ICD-10-CM | POA: Insufficient documentation

## 2021-02-06 DIAGNOSIS — L98492 Non-pressure chronic ulcer of skin of other sites with fat layer exposed: Secondary | ICD-10-CM | POA: Diagnosis not present

## 2021-02-06 DIAGNOSIS — Z872 Personal history of diseases of the skin and subcutaneous tissue: Secondary | ICD-10-CM | POA: Insufficient documentation

## 2021-02-06 DIAGNOSIS — Z09 Encounter for follow-up examination after completed treatment for conditions other than malignant neoplasm: Secondary | ICD-10-CM | POA: Diagnosis not present

## 2021-02-06 DIAGNOSIS — B354 Tinea corporis: Secondary | ICD-10-CM | POA: Diagnosis not present

## 2021-02-06 NOTE — Progress Notes (Signed)
ANNETH, BRINKMAN ([id number]) Visit Report for 02/06/2021 Arrival Information Details Patient Name: [name] [name], [name] [name]. Date of Service: 02/06/2021 3:15 PM Medical Record Number: [id number] Patient Account Number: [id number] Date of Birth/Sex: 05-30-1950 (71 y.o. F) Treating RN: Cornell Barman Primary Care Rober Skeels: Park Liter Other Clinician: Referring Jaloni Davoli: Park Liter Treating Edvardo Honse/Extender: Skipper Cliche in Treatment: 9 Visit Information History Since Last Visit Added or deleted any medications: No Patient Arrived: Ambulatory Has Dressing in Place as Prescribed: Yes Arrival Time: 15:16 Pain Present Now: No Accompanied By: husband Transfer Assistance: None Patient Identification Verified: Yes Secondary Verification Process Completed: Yes Patient Requires Transmission-Based Precautions: No Patient Has Alerts: No Electronic Signature(s) Signed: 02/06/2021 5:16:08 PM By: Gretta Cool, BSN, RN, CWS, Kim RN, BSN Entered By: Gretta Cool, BSN, RN, CWS, Kim on 02/06/2021 15:17:01 Emond, Laurian Brim ([id number]) -------------------------------------------------------------------------------- Clinic Level of Care Assessment Details Patient Name: [name] [name], [name] Date of Service: 02/06/2021 3:15 PM Medical Record Number: [id number] Patient Account Number: [id number] Date of Birth/Sex: 10/27/1950 (71 y.o. F) Treating RN: Cornell Barman Primary Care Kikuye Korenek: Park Liter Other Clinician: Referring Ozil Stettler: Park Liter Treating Crickett Abbett/Extender: Skipper Cliche in Treatment: 9 Clinic Level of Care Assessment Items TOOL 4 Quantity Score []  - Use when only an EandM is performed on FOLLOW-UP visit 0 ASSESSMENTS - Nursing Assessment / Reassessment X - Reassessment of Co-morbidities (includes updates in patient status) 1 10 X- 1 5 Reassessment of Adherence to Treatment Plan ASSESSMENTS - Wound and Skin Assessment / Reassessment X - Simple Wound Assessment / Reassessment - one wound 1 5 []  -  0 Complex Wound Assessment / Reassessment - multiple wounds []  - 0 Dermatologic / Skin Assessment (not related to wound area) ASSESSMENTS - Focused Assessment []  - Circumferential Edema Measurements - multi extremities 0 []  - 0 Nutritional Assessment / Counseling / Intervention []  - 0 Lower Extremity Assessment (monofilament, tuning fork, pulses) []  - 0 Peripheral Arterial Disease Assessment (using hand held doppler) ASSESSMENTS - Ostomy and/or Continence Assessment and Care []  - Incontinence Assessment and Management 0 []  - 0 Ostomy Care Assessment and Management (repouching, etc.) PROCESS - Coordination of Care X - Simple Patient / Family Education for ongoing care 1 15 []  - 0 Complex (extensive) Patient / Family Education for ongoing care []  - 0 Staff obtains Programmer, systems, Records, Test Results / Process Orders []  - 0 Staff telephones HHA, Nursing Homes / Clarify orders / etc []  - 0 Routine Transfer to another Facility (non-emergent condition) []  - 0 Routine Hospital Admission (non-emergent condition) []  - 0 New Admissions / Biomedical engineer / Ordering NPWT, Apligraf, etc. []  - 0 Emergency Hospital Admission (emergent condition) X- 1 10 Simple Discharge Coordination []  - 0 Complex (extensive) Discharge Coordination PROCESS - Special Needs []  - Pediatric / Minor Patient Management 0 []  - 0 Isolation Patient Management []  - 0 Hearing / Language / Visual special needs []  - 0 Assessment of Community assistance (transportation, D/C planning, etc.) []  - 0 Additional assistance / Altered mentation []  - 0 Support Surface(s) Assessment (bed, cushion, seat, etc.) INTERVENTIONS - Wound Cleansing / Measurement Reeser, Alekhya L. ([id number]) []  - 0 Simple Wound Cleansing - one wound []  - 0 Complex Wound Cleansing - multiple wounds X- 1 5 Wound Imaging (photographs - any number of wounds) []  - 0 Wound Tracing (instead of photographs) []  - 0 Simple Wound Measurement -  one wound []  - 0 Complex Wound Measurement - multiple wounds INTERVENTIONS - Wound Dressings []  - Small Wound Dressing one or multiple  wounds 0 []  - 0 Medium Wound Dressing one or multiple wounds []  - 0 Large Wound Dressing one or multiple wounds []  - 0 Application of Medications - topical []  - 0 Application of Medications - injection INTERVENTIONS - Miscellaneous []  - External ear exam 0 []  - 0 Specimen Collection (cultures, biopsies, blood, body fluids, etc.) []  - 0 Specimen(s) / Culture(s) sent or taken to Lab for analysis []  - 0 Patient Transfer (multiple staff / Civil Service fast streamer / Similar devices) []  - 0 Simple Staple / Suture removal (25 or less) []  - 0 Complex Staple / Suture removal (26 or more) []  - 0 Hypo / Hyperglycemic Management (close monitor of Blood Glucose) []  - 0 Ankle / Brachial Index (ABI) - do not check if billed separately X- 1 5 Vital Signs Has the patient been seen at the hospital within the last three years: Yes Total Score: 55 Level Of Care: New/Established - Level 2 Electronic Signature(s) Signed: 02/06/2021 5:16:08 PM By: Gretta Cool, BSN, RN, CWS, Kim RN, BSN Entered By: Gretta Cool, BSN, RN, CWS, Kim on 02/06/2021 17:14:57 Carpenito, Laurian Brim ([id number]) -------------------------------------------------------------------------------- Encounter Discharge Information Details Patient Name: [name] [name], [name] Date of Service: 02/06/2021 3:15 PM Medical Record Number: [id number] Patient Account Number: [id number] Date of Birth/Sex: [birth year] (71 y.o. F) Treating RN: Cornell Barman Primary Care Lautaro Koral: Park Liter Other Clinician: Referring Jimmylee Ratterree: Park Liter Treating Alannie Amodio/Extender: Skipper Cliche in Treatment: 9 Encounter Discharge Information Items Discharge Condition: Stable Ambulatory Status: Ambulatory Discharge Destination: Home Transportation: Private Auto Accompanied By: husband Schedule Follow-up Appointment: No Clinical Summary of  Care: Electronic Signature(s) Signed: 02/06/2021 5:15:55 PM By: Gretta Cool, BSN, RN, CWS, Kim RN, BSN Entered By: Gretta Cool, BSN, RN, CWS, Kim on 02/06/2021 17:15:55 Heffron, Laurian Brim ([id number]) -------------------------------------------------------------------------------- Lower Extremity Assessment Details Patient Name: Wilmer, [name] Date of Service: 02/06/2021 3:15 PM Medical Record Number: [id number] Patient Account Number: [id number] Date of Birth/Sex: Jun 23, 1950 (71 y.o. F) Treating RN: Cornell Barman Primary Care Cimone Fahey: Park Liter Other Clinician: Referring Presli Fanguy: Park Liter Treating Mylen Mangan/Extender: Skipper Cliche in Treatment: 9 Electronic Signature(s) Signed: 02/06/2021 5:16:08 PM By: Gretta Cool, BSN, RN, CWS, Kim RN, BSN Entered By: Gretta Cool, BSN, RN, CWS, Kim on 02/06/2021 15:23:08 Hulett, Laurian Brim ([id number]) -------------------------------------------------------------------------------- Multi Wound Chart Details Patient Name: [name] [name], [name] Date of Service: 02/06/2021 3:15 PM Medical Record Number: [id number] Patient Account Number: [id number] Date of Birth/Sex: 18-Nov-1950 (71 y.o. F) Treating RN: Cornell Barman Primary Care Blanchard Willhite: Park Liter Other Clinician: Referring Decorian Schuenemann: Park Liter Treating Haleema Vanderheyden/Extender: Skipper Cliche in Treatment: 9 Vital Signs Height(in): 64 Pulse(bpm): 80 Weight(lbs): 172 Blood Pressure(mmHg): 146/85 Body Mass Index(BMI): 30 Temperature(F): 97.7 Respiratory Rate(breaths/min): 16 Photos: [N/A:N/A] Wound Location: Abdomen - midline N/A N/A Wounding Event: Gradually Appeared N/A N/A Primary Etiology: Fungal N/A N/A Date Acquired: 09/01/2020 N/A N/A Weeks of Treatment: 9 N/A N/A Wound Status: Healed - Epithelialized N/A N/A Measurements L x W x D (cm) 0x0x0 N/A N/A Area (cm) : 0 N/A N/A Volume (cm) : 0 N/A N/A % Reduction in Area: 100.00% N/A N/A % Reduction in Volume: 100.00% N/A N/A Classification: Full Thickness  Without Exposed N/A N/A Support Structures Exudate Amount: Medium N/A N/A Exudate Type: Serous N/A N/A Exudate Color: amber N/A N/A Treatment Notes Electronic Signature(s) Signed: 02/06/2021 5:14:08 PM By: Gretta Cool, BSN, RN, CWS, Kim RN, BSN Entered By: Gretta Cool, BSN, RN, CWS, Kim on 02/06/2021 17:14:07 Lingafelter, Laurian Brim ([id number]) -------------------------------------------------------------------------------- [place] Plan Details Patient Name: [name] [name], [name] Date of Service: 02/06/2021 3:15  PM Medical Record Number: [id number] Patient Account Number: [id number] Date of Birth/Sex: Jul 22, 1950 (71 y.o. F) Treating RN: Cornell Barman Primary Care Artie Mcintyre: Park Liter Other Clinician: Referring Chapin Arduini: Park Liter Treating Kyleeann Cremeans/Extender: Skipper Cliche in Treatment: 9 Active Inactive Electronic Signature(s) Signed: 02/06/2021 5:13:59 PM By: Gretta Cool, BSN, RN, CWS, Kim RN, BSN Previous Signature: 02/06/2021 3:38:17 PM Version By: Gretta Cool, BSN, RN, CWS, Kim RN, BSN Entered By: Gretta Cool, BSN, RN, CWS, Kim on 02/06/2021 17:13:59 Horan, Laurian Brim ([id number]) -------------------------------------------------------------------------------- Pain Assessment Details Patient Name: [name] [name], [name] Date of Service: 02/06/2021 3:15 PM Medical Record Number: [id number] Patient Account Number: [id number] Date of Birth/Sex: 02-01-1951 (71 y.o. F) Treating RN: Cornell Barman Primary Care Asmaa Tirpak: Park Liter Other Clinician: Referring Aundray Cartlidge: Park Liter Treating Cowen Pesqueira/Extender: Skipper Cliche in Treatment: 9 Active Problems Location of Pain Severity and Description of Pain Patient Has Paino No Site Locations Pain Management and Medication Current Pain Management: Electronic Signature(s) Signed: 02/06/2021 5:16:08 PM By: Gretta Cool, BSN, RN, CWS, Kim RN, BSN Entered By: Gretta Cool, BSN, RN, CWS, Kim on 02/06/2021 15:17:30 Witting, Laurian Brim  ([id number]) -------------------------------------------------------------------------------- Patient/Caregiver Education Details Patient Name: [name] [name], [name] Date of Service: 02/06/2021 3:15 PM Medical Record Number: [id number] Patient Account Number: [id number] Date of Birth/Gender: Oct 16, 1950 (71 y.o. F) Treating RN: Cornell Barman Primary Care Physician: Park Liter Other Clinician: Referring Physician: Park Liter Treating Physician/Extender: Skipper Cliche in Treatment: 9 Education Assessment Education Provided To: Patient Education Topics Provided Basic Hygiene: Methods: Explain/Verbal Responses: State content correctly [employer]) Signed: 02/06/2021 5:16:08 PM By: Gretta Cool, BSN, RN, CWS, Kim RN, BSN Entered By: Gretta Cool, BSN, RN, CWS, Kim on 02/06/2021 17:15:22 Guterrez, Laurian Brim ([id number]) -------------------------------------------------------------------------------- Wound Assessment Details Patient Name: [name] [name], [name] Date of Service: 02/06/2021 3:15 PM Medical Record Number: [id number] Patient Account Number: [id number] Date of Birth/Sex: [birth year] (71 y.o. F) Treating RN: Cornell Barman Primary Care Johsua Shevlin: Park Liter Other Clinician: Referring Falon Flinchum: Park Liter Treating Kaisy Severino/Extender: Jeri Cos Weeks in Treatment: 9 Wound Status Wound Number: 1 Primary Etiology: Fungal Wound Location: Abdomen - midline Wound Status: Healed - Epithelialized Wounding Event: Gradually Appeared Date Acquired: 09/01/2020 Weeks Of Treatment: 9 Clustered Wound: No Photos Photo Uploaded By: Gretta Cool, BSN, RN, CWS, Kim on 02/06/2021 15:41:00 Wound Measurements Length: (cm) 0 Width: (cm) 0 Depth: (cm) 0 Area: (cm) Volume: (cm) % Reduction in Area: 100% % Reduction in Volume: 100% 0 0 Wound Description Classification: Full Thickness Without Exposed Support Structu Exudate Amount: Medium Exudate Type: Serous Exudate Color: amber res Electronic  Signature(s) Signed: 02/06/2021 5:16:08 PM By: Gretta Cool, BSN, RN, CWS, Kim RN, BSN Entered By: Gretta Cool, BSN, RN, CWS, Kim on 02/06/2021 15:22:52 Rendall, Laurian Brim ([id number]) -------------------------------------------------------------------------------- Vitals Details Patient Name: [name] [name], [name] Date of Service: 02/06/2021 3:15 PM Medical Record Number: [id number] Patient Account Number: [id number] Date of Birth/Sex: 08/31/1950 (71 y.o. F) Treating RN: Cornell Barman Primary Care Sullivan Jacuinde: Park Liter Other Clinician: Referring Abbegale Stehle: Park Liter Treating Gerre Ranum/Extender: Skipper Cliche in Treatment: 9 Vital Signs Time Taken: 15:17 Temperature (F): 97.7 Height (in): 64 Pulse (bpm): 80 Weight (lbs): 172 Respiratory Rate (breaths/min): 16 Body Mass Index (BMI): 29.5 Blood Pressure (mmHg): 146/85 Reference Range: 80 - 120 mg / dl Electronic Signature(s) Signed: 02/06/2021 5:16:08 PM By: Gretta Cool, BSN, RN, CWS, Kim RN, BSN Entered By: Gretta Cool, BSN, RN, CWS, Kim on 02/06/2021 15:17:24

## 2021-02-06 NOTE — Progress Notes (Addendum)
ALBANA, SAPERSTEIN ([id number]) Visit Report for 02/06/2021 Chief Complaint Document Details Patient Name: [name] [name], [name] Date of Service: 02/06/2021 3:15 PM Medical Record Number: [id number] Patient Account Number: [id number] Date of Birth/Sex: 1950-10-08 (71 y.o. F) Treating RN: Huel Coventry Primary Care Provider: Olevia Perches Other Clinician: Referring Provider: Olevia Perches Treating Provider/Extender: Allen Derry Weeks in Treatment: 9 Information Obtained from: Patient Chief Complaint Midline abdominal ulcer Electronic Signature(s) Signed: 02/06/2021 3:21:04 PM By: Lenda Kelp PA-C Entered By: Lenda Kelp on 02/06/2021 15:21:04 [name] [name] ([id number]) -------------------------------------------------------------------------------- HPI Details Patient Name: [name] [name], [name] Date of Service: 02/06/2021 3:15 PM Medical Record Number: [id number] Patient Account Number: [id number] Date of Birth/Sex: 12-21-1950 (71 y.o. F) Treating RN: Huel Coventry Primary Care Provider: Olevia Perches Other Clinician: Referring Provider: Olevia Perches Treating Provider/Extender: Rowan Blase in Treatment: 9 History of Present Illness HPI Description: 12/01/2020 upon evaluation today patient presents for initial inspection here in the clinic concerning a wound over the midline lower abdominal region which is underneath her pannus. Subsequently she has been using Lotrisone and nystatin powder in general over the area but then subsequently had a region here in particular where she had a larger area of skin breakdown I think this is simply due to the skin contact to the opposite side skin and moisture trapping underneath. It does have a very distinctive ringworm type appearance with a central clearing and erythematous border around and then clear outside. She has a history of Alzheimer's and hypertension but otherwise no major medical problems. 12/12/2020 upon evaluation today patient appears to  be doing a little bit more poorly in regard to her wound. There is some evidence of bruising where the tape from the dressing seems to be sticking to her skin causing some trouble here. I think we may want to switch things up from a dressing standpoint to some degree here. 12/19/2020 upon evaluation today patient's wound is actually showing signs of significant improvement. I am actually extremely pleased with where we stand this week. The patient is likewise very happy with where we are. There is no signs of active infection which is also great news. No fevers, chills, nausea, vomiting, or diarrhea. 01/02/2021 upon evaluation today patient appears to be doing well with regard to her wound. This is actually measuring significantly smaller which is great news and overall I think we are getting very close to resolution. I do not see any signs of active infection at this time. 01/16/2021 upon evaluation today patient appears to be doing well currently in regard to her wound. In fact this appears to be very close to complete resolution. I do not see any signs of active infection locally nor systemically at this point. Overall I am pleased in that regard. Still there is a lot of scarring right in the center part with this is still open and I think that scar tissue is due to multiple C-sections and her husband states that is always the area that was the hardest to heal whenever she had a C-section 1 time it took as long as 6 months. 02/06/2021 upon evaluation today patient appears to be doing well with regard to her wound. This in fact appears to be completely healed which is great news. Fortunately I see no signs of active infection at this time. Electronic Signature(s) Signed: 02/06/2021 3:25:10 PM By: Lenda Kelp PA-C Entered By: Lenda Kelp on 02/06/2021 15:25:10 [name] [name], [name] [name] ([id number]) -------------------------------------------------------------------------------- Physical Exam  Details Patient Name: [name],  [name] Date of Service: 02/06/2021 3:15 PM Medical Record Number: [id number] Patient Account Number: [id number] Date of Birth/Sex: 1950/07/05 (71 y.o. F) Treating RN: Huel Coventry Primary Care Provider: Olevia Perches Other Clinician: Referring Provider: Olevia Perches Treating Provider/Extender: Allen Derry Weeks in Treatment: 9 Constitutional Well-nourished and well-hydrated in no acute distress. Respiratory normal breathing without difficulty. Psychiatric this patient is able to make decisions and demonstrates good insight into disease process. Alert and Oriented x 3. pleasant and cooperative. Notes Patient's wound showed complete epithelization which is great news and overall I am extremely pleased with what I see today. There is no signs of active infection at this time. Electronic Signature(s) Signed: 02/06/2021 3:25:28 PM By: Lenda Kelp PA-C Entered By: Lenda Kelp on 02/06/2021 15:25:27 [name] [name], [name] [name] ([id number]) -------------------------------------------------------------------------------- Physician Orders Details Patient Name: [name], [name] Date of Service: 02/06/2021 3:15 PM Medical Record Number: [id number] Patient Account Number: [id number] Date of Birth/Sex: 09/02/1950 (71 y.o. F) Treating RN: Huel Coventry Primary Care Provider: Olevia Perches Other Clinician: Referring Provider: Olevia Perches Treating Provider/Extender: Rowan Blase in Treatment: 9 Verbal / Phone Orders: No Diagnosis Coding ICD-10 Coding Code Description B35.4 Tinea corporis L98.492 Non-pressure chronic ulcer of skin of other sites with fat layer exposed G30.8 Other Alzheimer's disease I10 Essential (primary) hypertension Discharge From [facility] o Discharge from Wound Care Center Treatment Complete Electronic Signature(s) Signed: 02/06/2021 5:14:35 PM By: Elliot Gurney, BSN, RN, CWS, Kim RN, BSN Signed: 02/06/2021 5:21:10 PM By: Lenda Kelp  PA-C Entered By: Elliot Gurney, BSN, RN, CWS, Kim on 02/06/2021 17:14:34 [name] [name], [name] [name] ([id number]) -------------------------------------------------------------------------------- Problem List Details Patient Name: Lacross, Oni L. Date of Service: 02/06/2021 3:15 PM Medical Record Number: [id number] Patient Account Number: [id number] Date of Birth/Sex: [birth year] (71 y.o. F) Treating RN: Huel Coventry Primary Care Provider: Olevia Perches Other Clinician: Referring Provider: Olevia Perches Treating Provider/Extender: Allen Derry Weeks in Treatment: 9 Active Problems ICD-10 Encounter Code Description Active Date MDM Diagnosis B35.4 Tinea corporis 12/01/2020 No Yes L98.492 Non-pressure chronic ulcer of skin of other sites with fat layer exposed 12/01/2020 No Yes G30.8 Other Alzheimer's disease 12/01/2020 No Yes I10 Essential (primary) hypertension 12/01/2020 No Yes Inactive Problems Resolved Problems Electronic Signature(s) Signed: 02/06/2021 3:20:59 PM By: Lenda Kelp PA-C Entered By: Lenda Kelp on 02/06/2021 15:20:59 [name] [name], [name] [name] ([id number]) -------------------------------------------------------------------------------- Progress Note Details Patient Name: [name] [name], [name] Date of Service: 02/06/2021 3:15 PM Medical Record Number: [id number] Patient Account Number: [id number] Date of Birth/Sex: [birth year] (71 y.o. F) Treating RN: Huel Coventry Primary Care Provider: Olevia Perches Other Clinician: Referring Provider: Olevia Perches Treating Provider/Extender: Rowan Blase in Treatment: 9 Subjective Chief Complaint Information obtained from Patient Midline abdominal ulcer History of Present Illness (HPI) 12/01/2020 upon evaluation today patient presents for initial inspection here in the clinic concerning a wound over the midline lower abdominal region which is underneath her pannus. Subsequently she has been using Lotrisone and nystatin powder in general over the area but  then subsequently had a region here in particular where she had a larger area of skin breakdown I think this is simply due to the skin contact to the opposite side skin and moisture trapping underneath. It does have a very distinctive ringworm type appearance with a central clearing and erythematous border around and then clear outside. She has a history of Alzheimer's and hypertension but otherwise no major medical problems. 12/12/2020 upon evaluation today patient appears to be doing a little bit more poorly in regard to her  wound. There is some evidence of bruising where the tape from the dressing seems to be sticking to her skin causing some trouble here. I think we may want to switch things up from a dressing standpoint to some degree here. 12/19/2020 upon evaluation today patient's wound is actually showing signs of significant improvement. I am actually extremely pleased with where we stand this week. The patient is likewise very happy with where we are. There is no signs of active infection which is also great news. No fevers, chills, nausea, vomiting, or diarrhea. 01/02/2021 upon evaluation today patient appears to be doing well with regard to her wound. This is actually measuring significantly smaller which is great news and overall I think we are getting very close to resolution. I do not see any signs of active infection at this time. 01/16/2021 upon evaluation today patient appears to be doing well currently in regard to her wound. In fact this appears to be very close to complete resolution. I do not see any signs of active infection locally nor systemically at this point. Overall I am pleased in that regard. Still there is a lot of scarring right in the center part with this is still open and I think that scar tissue is due to multiple C-sections and her husband states that is always the area that was the hardest to heal whenever she had a C-section 1 time it took as long as 6  months. 02/06/2021 upon evaluation today patient appears to be doing well with regard to her wound. This in fact appears to be completely healed which is great news. Fortunately I see no signs of active infection at this time. Objective Constitutional Well-nourished and well-hydrated in no acute distress. Vitals Time Taken: 3:17 PM, Height: 64 in, Weight: 172 lbs, BMI: 29.5, Temperature: 97.7 F, Pulse: 80 bpm, Respiratory Rate: 16 breaths/min, Blood Pressure: 146/85 mmHg. Respiratory normal breathing without difficulty. Psychiatric this patient is able to make decisions and demonstrates good insight into disease process. Alert and Oriented x 3. pleasant and cooperative. General Notes: Patient's wound showed complete epithelization which is great news and overall I am extremely pleased with what I see today. There is no signs of active infection at this time. Integumentary (Hair, Skin) Wound #1 status is Healed - Epithelialized. Original cause of wound was Gradually Appeared. The date acquired was: 09/01/2020. The wound has been in treatment 9 weeks. The wound is located on the Abdomen - midline. The wound measures 0cm length x 0cm width x 0cm depth; 0cm^2 area and 0cm^3 volume. There is a medium amount of serous drainage noted. [name] [name], [name] PangVICKIE L. ([id number]) Assessment Active Problems ICD-10 Tinea corporis Non-pressure chronic ulcer of skin of other sites with fat layer exposed Other Alzheimer's disease Essential (primary) hypertension Plan 1. I would recommend that we go ahead and continue with the wound care measures as before and the patient is in agreement with plan. This includes the use of the baby powder just to keep things nice and dry. He is also using the Interdry which seems to do excellent for her. We will see her back for follow-up visit as needed. Electronic Signature(s) Signed: 02/06/2021 3:25:47 PM By: Lenda KelpStone III, Franklin Clapsaddle PA-C Entered By: Lenda KelpStone III, Arvie Bartholomew on 02/06/2021  15:25:46 [name] [name], [name] PangVICKIE L. ([id number]) -------------------------------------------------------------------------------- SuperBill Details Patient Name: [name] [name], [name] Date of Service: 02/06/2021 Medical Record Number: [id number] Patient Account Number: [id number] Date of Birth/Sex: Jul 27, 1950 7(70 y.o. F) Treating RN: Huel CoventryWoody, Kim Primary Care Provider: Olevia PerchesJohnson, Megan Other  Clinician: Referring Provider: Olevia PerchesJohnson, Megan Treating Provider/Extender: Rowan BlaseStone, Kechia Yahnke Weeks in Treatment: 9 Diagnosis Coding ICD-10 Codes Code Description B35.4 Tinea corporis L98.492 Non-pressure chronic ulcer of skin of other sites with fat layer exposed G30.8 Other Alzheimer's disease I10 Essential (primary) hypertension Facility Procedures CPT4 Code: [id number] Description: [phone] - WOUND CARE VISIT-LEV 2 EST PT Modifier: Quantity: 1 Physician Procedures CPT4 Code: [id number] Description: 99213 - WC PHYS LEVEL 3 - EST PT Modifier: Quantity: 1 CPT4 Code: Description: ICD-10 Diagnosis Description B35.4 Tinea corporis L98.492 Non-pressure chronic ulcer of skin of other sites with fat layer expos G30.8 Other Alzheimer's disease I10 Essential (primary) hypertension Modifier: ed Quantity: Electronic Signature(s) Signed: 02/06/2021 5:15:08 PM By: Elliot GurneyWoody, BSN, RN, CWS, Kim RN, BSN Signed: 02/06/2021 5:21:10 PM By: Lenda KelpStone III, Brittan Butterbaugh PA-C Previous Signature: 02/06/2021 3:26:12 PM Version By: Lenda KelpStone III, Brittlyn Cloe PA-C Entered By: Elliot GurneyWoody, BSN, RN, CWS, Kim on 02/06/2021 17:15:07

## 2021-02-11 ENCOUNTER — Other Ambulatory Visit: Payer: Self-pay | Admitting: Family Medicine

## 2021-02-11 NOTE — Telephone Encounter (Signed)
Requested Prescriptions  Pending Prescriptions Disp Refills   hydrOXYzine (ATARAX) 25 MG tablet [Pharmacy Med Name: HYDROXYZINE HCL 25 MG TABLET] 90 tablet 0    Sig: Take 1 tablet (25 mg total) by mouth 3 (three) times daily as needed.     Ear, Nose, and Throat:  Antihistamines Passed - 02/11/2021 12:48 PM      Passed - Valid encounter within last 12 months    Recent Outpatient Visits          2 months ago Essential (primary) hypertension   [facility] [place], [name], DO   3 months ago [place], Scheryl Darter, NP   4 months ago [place], Lauren A, NP   5 months ago Alzheimer's dementia without behavioral disturbance, unspecified timing of dementia onset ([place])   [place], [name], DO   8 months ago Routine general medical examination at a health care facility   [place], Barb Merino, DO      Future Appointments            In 2 weeks Wynetta Emery, Barb Merino, DO [place], [place]   In 2 months  [employer], [place]

## 2021-02-12 ENCOUNTER — Other Ambulatory Visit: Payer: Self-pay | Admitting: Family Medicine

## 2021-02-12 NOTE — Telephone Encounter (Signed)
Requested Prescriptions  Pending Prescriptions Disp Refills   venlafaxine XR (EFFEXOR-XR) 150 MG 24 hr capsule [Pharmacy Med Name: VENLAFAXINE HCL ER 150 MG CAP] 90 capsule 0    Sig: Take 1 capsule (150 mg total) by mouth at bedtime.     Psychiatry: Antidepressants - SNRI - desvenlafaxine & venlafaxine Failed - 02/12/2021  1:21 PM      Failed - LDL in normal range and within 360 days    LDL Chol Calc (NIH)  Date Value Ref Range Status  11/21/2020 120 (H) 0 - 99 mg/dL Final         Failed - Triglycerides in normal range and within 360 days    Triglycerides  Date Value Ref Range Status  11/21/2020 186 (H) 0 - 149 mg/dL Final   Triglycerides Piccolo,Waived  Date Value Ref Range Status  01/07/2016 190 (H) <150 mg/dL Final    Comment:                            Normal                   <150                         Borderline High     150 - 199                         High                200 - 499                         Very High                >499          Passed - Total Cholesterol in normal range and within 360 days    Cholesterol, Total  Date Value Ref Range Status  11/21/2020 195 100 - 199 mg/dL Final   Cholesterol Piccolo, Waived  Date Value Ref Range Status  01/07/2016 203 (H) <200 mg/dL Final    Comment:                            Desirable                <200                         Borderline High      200- 239                         High                     >239          Passed - Completed PHQ-2 or PHQ-9 in the last 360 days      Passed - Last BP in normal range    BP Readings from Last 1 Encounters:  11/21/20 114/78         Passed - Valid encounter within last 6 months    Recent Outpatient Visits          2 months ago Essential (primary) hypertension   [facility] [employer], [name], DO   3 months ago  [facility]   [facility] [place], Lauren A, NP   4 months ago [facility]   [employer], Lauren A, NP   5  months ago Alzheimer's dementia without behavioral disturbance, unspecified timing of dementia onset (HCC)   [facility] [place], [name], DO   8 months ago Routine general medical examination at a health care facility   [facility] [place], [place], DO      Future Appointments            In 2 weeks Laural Benes, [name], DO [facility]   In 2 months  [employer], [facility]            naproxen (NAPROSYN) 500 MG tablet [Pharmacy Med Name: NAPROXEN 500 MG TABLET] 180 tablet 0    Sig: Take 1 tablet (500 mg total) by mouth 2 (two) times daily with a meal.     Analgesics:  NSAIDS Failed - 02/12/2021  1:21 PM      Failed - Cr in normal range and within 360 days    Creatinine, Ser  Date Value Ref Range Status  11/21/2020 1.02 (H) 0.57 - 1.00 mg/dL Final         Passed - HGB in normal range and within 360 days    Hemoglobin  Date Value Ref Range Status  11/21/2020 12.7 11.1 - 15.9 g/dL Final         Passed - Patient is not pregnant      Passed - Valid encounter within last 12 months    Recent Outpatient Visits          2 months ago Essential (primary) hypertension   [facility] [place], [name], DO   3 months ago [facility]   [employer], Lauren A, NP   4 months ago [facility]   [employer], Lauren A, NP   5 months ago Alzheimer's dementia without behavioral disturbance, unspecified timing of dementia onset (HCC)   [facility] [place], [name], DO   8 months ago Routine general medical examination at a health care facility   [facility] [place], [place], DO      Future Appointments            In 2 weeks Laural Benes, Oralia Rud, DO [facility]   In 2 months  [employer], [facility]

## 2021-02-23 ENCOUNTER — Ambulatory Visit: Payer: Medicare PPO | Admitting: Family Medicine

## 2021-02-27 ENCOUNTER — Other Ambulatory Visit: Payer: Self-pay

## 2021-02-27 ENCOUNTER — Ambulatory Visit: Payer: Medicare PPO | Admitting: Family Medicine

## 2021-02-27 DIAGNOSIS — Z538 Procedure and treatment not carried out for other reasons: Secondary | ICD-10-CM

## 2021-02-27 NOTE — Progress Notes (Signed)
Patient did not need appointment today. Will follow up in April as needed.

## 2021-05-04 ENCOUNTER — Ambulatory Visit (INDEPENDENT_AMBULATORY_CARE_PROVIDER_SITE_OTHER): Payer: Medicare PPO | Admitting: *Deleted

## 2021-05-04 DIAGNOSIS — Z780 Asymptomatic menopausal state: Secondary | ICD-10-CM | POA: Diagnosis not present

## 2021-05-04 DIAGNOSIS — Z0000 Encounter for general adult medical examination without abnormal findings: Secondary | ICD-10-CM | POA: Diagnosis not present

## 2021-05-04 NOTE — Progress Notes (Signed)
? ?Subjective:  ? [name] [name] is a 71 y.o. female who presents for Medicare Annual (Subsequent) preventive examination. ? ?I connected with husband [name] [name]   ([name] [name] )on 05/04/21 by a telephone enabled telemedicine application and verified that I am speaking with the correct person using two identifiers. ?  ?I discussed the limitations of evaluation and management by telemedicine. The patient expressed understanding and agreed to proceed. ? ?Patient location: home ? ?Provider location: Tele-health not in office ? ? ? ?Review of Systems    ? ?Cardiac Risk Factors include: advanced age (>60men, >19 women);hypertension (patient does go to adult daycare 5 days a week) ? ?   ?Objective:  ?  ?Today's Vitals  ? ?There is no height or weight on file to calculate BMI. ? ? ?  05/04/2021  ? 12:04 PM 05/02/2020  ?  2:33 PM 04/04/2019  ?  9:42 AM 01/07/2016  ?  8:43 AM 01/06/2015  ?  8:37 AM 07/23/2014  ?  2:46 PM 06/25/2014  ?  1:09 PM  ?Advanced Directives  ?Does Patient Have a Medical Advance Directive? Yes Yes Yes Yes No No No  ?Type of Estate agent of [employer] Power of [place];Living will Living will;Healthcare Power of [employer] Power of [place];Living will     ?Does patient want to make changes to medical advance directive?     Yes - information given    ?Copy of Healthcare Power of Attorney in Chart? No - copy requested No - copy requested No - copy requested No - copy requested No - copy requested    ?Would patient like information on creating a medical advance directive?     Yes - Transport planner given Yes - Transport planner given No - patient declined information  ? ? ?Current Medications (verified) ?Outpatient Encounter Medications as of 05/04/2021  ?Medication Sig  ? ARIPiprazole (ABILIFY) 5 MG tablet Take 1 tablet (5 mg total) by mouth daily.  ? buPROPion (WELLBUTRIN XL) 300 MG 24 hr tablet Take 1 tablet (300 mg total) by mouth daily.  ?  clotrimazole-betamethasone (LOTRISONE) cream Apply 1 application topically 2 (two) times daily.  ? donepezil (ARICEPT) 5 MG tablet Take 5 mg by mouth at bedtime.  ? hydrochlorothiazide (HYDRODIURIL) 25 MG tablet Take 1 tablet (25 mg total) by mouth daily.  ? hydrOXYzine (ATARAX) 25 MG tablet Take 1 tablet (25 mg total) by mouth 3 (three) times daily as needed.  ? naproxen (NAPROSYN) 500 MG tablet Take 1 tablet (500 mg total) by mouth 2 (two) times daily with a meal.  ? nystatin (MYCOSTATIN/NYSTOP) powder Apply 1 application topically 3 (three) times daily.  ? venlafaxine XR (EFFEXOR-XR) 150 MG 24 hr capsule Take 1 capsule (150 mg total) by mouth at bedtime.  ? Vitamin D, Ergocalciferol, (DRISDOL) 1.25 MG (50000 UNIT) CAPS capsule Take 1 capsule (50,000 Units total) by mouth every 7 (seven) days.  ? mupirocin cream (BACTROBAN) 2 % Apply 1 application topically 2 (two) times daily. (Patient not taking: Reported on 05/04/2021)  ? [DISCONTINUED] ARIPiprazole (ABILIFY) 5 MG tablet Take 1 tablet (5 mg total) by mouth daily.  ? [DISCONTINUED] buPROPion (WELLBUTRIN XL) 300 MG 24 hr tablet Take 1 tablet (300 mg total) by mouth daily.  ? [DISCONTINUED] clotrimazole-betamethasone (LOTRISONE) cream Apply 1 application topically 2 (two) times daily.  ? [DISCONTINUED] fluconazole (DIFLUCAN) 150 MG tablet Take 1 tablet (150 mg total) by mouth daily.  ? [DISCONTINUED] hydrochlorothiazide (HYDRODIURIL) 25 MG tablet Take 1  tablet (25 mg total) by mouth daily.  ? [DISCONTINUED] hydrOXYzine (ATARAX/VISTARIL) 25 MG tablet Take 1 tablet (25 mg total) by mouth 3 (three) times daily as needed.  ? [DISCONTINUED] naproxen (NAPROSYN) 500 MG tablet Take 1 tablet (500 mg total) by mouth 2 (two) times daily with a meal.  ? [DISCONTINUED] nystatin (MYCOSTATIN/NYSTOP) powder Apply 1 application topically 3 (three) times daily.  ? [DISCONTINUED] venlafaxine XR (EFFEXOR-XR) 150 MG 24 hr capsule Take 1 capsule (150 mg total) by mouth at bedtime.  ?  [DISCONTINUED] Vitamin D, Ergocalciferol, (DRISDOL) 1.25 MG (50000 UNIT) CAPS capsule Take 1 capsule (50,000 Units total) by mouth every 7 (seven) days.  ? ?No facility-administered encounter medications on file as of 05/04/2021.  ? ? ?Allergies (verified) ?Codeine and Tussionex pennkinetic er [hydrocod poli-chlorphe poli er]  ? ?History: ?Past Medical History:  ?Diagnosis Date  ? Hypertension   ? Migraines   ? ?Past Surgical History:  ?Procedure Laterality Date  ? CARPAL TUNNEL RELEASE    ? CESAREAN SECTION    ? CHOLECYSTECTOMY    ? HAMMER TOE SURGERY Bilateral   ? TOTAL ABDOMINAL HYSTERECTOMY W/ BILATERAL SALPINGOOPHORECTOMY    ? ?Family History  ?Problem Relation Age of Onset  ? Heart disease Mother   ? Heart attack Father   ? Hypertension Father   ? Cancer Paternal Aunt   ? Breast cancer Paternal Aunt   ? ?Social History  ? ?Socioeconomic History  ? Marital status: Married  ?  Spouse name: Not on file  ? Number of children: Not on file  ? Years of education: Not on file  ? Highest education level: Not on file  ?Occupational History  ? Not on file  ?Tobacco Use  ? Smoking status: Never  ? Smokeless tobacco: Never  ?Vaping Use  ? Vaping Use: Never used  ?Substance and Sexual Activity  ? Alcohol use: No  ?  Alcohol/week: 0.0 standard drinks  ? Drug use: No  ? Sexual activity: Not Currently  ?Other Topics Concern  ? Not on file  ?Social History Narrative  ? No exercise  ? ?Social Determinants of Health  ? ?Financial Resource Strain: Low Risk   ? Difficulty of Paying Living Expenses: Not hard at all  ?Food Insecurity: No Food Insecurity  ? Worried About Programme researcher, broadcasting/film/videounning Out of Food in the Last Year: Never true  ? Ran Out of Food in the Last Year: Never true  ?Transportation Needs: No Transportation Needs  ? Lack of Transportation (Medical): No  ? Lack of Transportation (Non-Medical): No  ?Physical Activity: Inactive  ? Days of Exercise per Week: 0 days  ? Minutes of Exercise per Session: 0 min  ?Stress: Not on file  ?Social  Connections: Socially Isolated  ? Frequency of Communication with Friends and Family: Never  ? Frequency of Social Gatherings with Friends and Family: Once a week  ? Attends Religious Services: Never  ? Active Member of Clubs or Organizations: No  ? Attends BankerClub or Organization Meetings: Never  ? Marital Status: Married  ? ? ?Tobacco Counseling ?Counseling given: Not Answered ? ? ?Clinical Intake: ? ?Pre-visit preparation completed: Yes ? ?Pain : No/denies pain ? ?  ? ?Nutritional Risks: None ?Diabetes: No ? ?How often do you need to have someone help you when you read instructions, pamphlets, or other written materials from your doctor or pharmacy?: 5 - Always ? ?Diabetic?no ? ?Interpreter Needed?: No ? ?Information entered by :: Remi HaggardJulie Esterlene Atiyeh LPN ? ? ?Activities of Daily Living ? ?  05/04/2021  ? 12:17 PM 05/23/2020  ?  8:59 AM  ?In your present state of health, do you have any difficulty performing the following activities:  ?Hearing? 1 0  ?Vision? 0 0  ?Difficulty concentrating or making decisions? 1 1  ?Walking or climbing stairs? 0 0  ?Dressing or bathing? 1 0  ?Doing errands, shopping? 1 1  ?Preparing Food and eating ? Y   ?Using the Toilet? N   ?In the past six months, have you accidently leaked urine? Y   ?Do you have problems with loss of bowel control? N   ?Managing your Medications? Y   ?Managing your Finances? Y   ?Housekeeping or managing your Housekeeping? Y   ? ? ?Patient Care Team: ?Dorcas Carrow, DO as PCP - General (Family Medicine) ?Morene Crocker, MD as Referring Physician (Neurology) ? ?Indicate any recent Medical Services you may have received from other than [facility] providers in the past year (date may be approximate). ? ?   ?Assessment:  ? This is a routine wellness examination for [place]. ? ?Hearing/Vision screen ?Hearing Screening - Comments:: Has hearing aids ?Has not worn them in a year ?Vision Screening - Comments:: Not up to date.   [place] [facility] ? ?Dietary issues and exercise activities  discussed: ?Current Exercise Habits: The patient does not participate in regular exercise at present, Exercise limited by: neurologic condition(s) ? ? Goals Addressed   ?None ?  ? ?Depression Screen ? ?  05/04/2021  ? 12:06 PM 7/21/

## 2021-05-21 ENCOUNTER — Other Ambulatory Visit: Payer: Self-pay | Admitting: Family Medicine

## 2021-05-22 ENCOUNTER — Other Ambulatory Visit: Payer: Self-pay | Admitting: Family Medicine

## 2021-05-22 NOTE — Telephone Encounter (Signed)
Requested Prescriptions  ?Pending Prescriptions Disp Refills  ?? venlafaxine XR (EFFEXOR-XR) 150 MG 24 hr capsule [Pharmacy Med Name: VENLAFAXINE HCL ER 150 MG CAP] 90 capsule 0  ?  Sig: Take 1 capsule (150 mg total) by mouth at bedtime.  ?  ? Psychiatry: Antidepressants - SNRI - desvenlafaxine & venlafaxine Failed - 05/22/2021  1:00 PM  ?  ?  Failed - Cr in normal range and within 360 days  ?  Creatinine, Ser  ?Date Value Ref Range Status  ?11/21/2020 1.02 (H) 0.57 - 1.00 mg/dL Final  ?   ?  ?  Failed - Lipid Panel in normal range within the last 12 months  ?  Cholesterol, Total  ?Date Value Ref Range Status  ?11/21/2020 195 100 - 199 mg/dL Final  ? ?Cholesterol Piccolo, [place]  ?Date Value Ref Range Status  ?01/07/2016 203 (H) <200 mg/dL Final  ?  Comment:  ?                          Desirable                <200 ?                        Borderline High      200- 239 ?                        High                     >239 ?  ? ?LDL Chol Calc (NIH)  ?Date Value Ref Range Status  ?11/21/2020 120 (H) 0 - 99 mg/dL Final  ? ?HDL  ?Date Value Ref Range Status  ?11/21/2020 42 >39 mg/dL Final  ? ?Triglycerides  ?Date Value Ref Range Status  ?11/21/2020 186 (H) 0 - 149 mg/dL Final  ? ?Triglycerides Piccolo,Waived  ?Date Value Ref Range Status  ?01/07/2016 190 (H) <150 mg/dL Final  ?  Comment:  ?                          Normal                   <150 ?                        Borderline High     150 - 199 ?                        High                200 - 499 ?                        Very High                >499 ?  ? ?  ?  ?  Passed - Completed PHQ-2 or PHQ-9 in the last 360 days  ?  ?  Passed - Last BP in normal range  ?  BP Readings from Last 1 Encounters:  ?11/21/20 114/78  ?   ?  ?  Passed - Valid encounter within last 6 months  ?  Recent Outpatient Visits   ?      ? 2 months ago Appointment  canceled by hospital  ? [facility], [name], DO  ? 6 months ago Essential (primary) hypertension  ? [place], [name], DO  ? 6 months ago Intertrigo  ? [place], Lauren A, NP  ? 7 months ago Intertrigo  ? [place], NP  ? 9 months ago Alzheimer's dementia without behavioral disturbance, unspecified timing of dementia onset ([place])  ? [place], [place] P, DO  ?  ?  ? ?  ?  ?  ?? naproxen (NAPROSYN) 500 MG tablet [Pharmacy Med Name: NAPROXEN 500 MG TABLET] 180 tablet 0  ?  Sig: Take 1 tablet (500 mg total) by mouth 2 (two) times daily with a meal.  ?  ? Analgesics:  NSAIDS Failed - 05/22/2021  1:00 PM  ?  ?  Failed - Manual Review: Labs are only required if the patient has taken medication for more than 8 weeks.  ?  ?  Failed - Cr in normal range and within 360 days  ?  Creatinine, Ser  ?Date Value Ref Range Status  ?11/21/2020 1.02 (H) 0.57 - 1.00 mg/dL Final  ?   ?  ?  Passed - HGB in normal range and within 360 days  ?  Hemoglobin  ?Date Value Ref Range Status  ?11/21/2020 12.7 11.1 - 15.9 g/dL Final  ?   ?  ?  Passed - PLT in normal range and within 360 days  ?  Platelets  ?Date Value Ref Range Status  ?11/21/2020 224 150 - 450 x10E3/uL Final  ?   ?  ?  Passed - HCT in normal range and within 360 days  ?  Hematocrit  ?Date Value Ref Range Status  ?11/21/2020 39.6 34.0 - 46.6 % Final  ?   ?  ?  Passed - eGFR is 30 or above and within 360 days  ?  GFR calc Af Amer  ?Date Value Ref Range Status  ?11/23/2019 68 >59 mL/min/1.73 Final  ?  Comment:  ?  **In accordance with recommendations from the NKF-ASN Task force,** ?  [facility] is in the process of updating its eGFR calculation to the ?  2021 CKD-EPI creatinine equation that estimates kidney function ?  without a race variable. ?  ? ?GFR calc non Af Amer  ?Date Value Ref Range Status  ?11/23/2019 59 (L) >59 mL/min/1.73 Final  ? ?eGFR  ?Date Value Ref Range Status  ?11/21/2020 59 (L) >59 mL/min/1.73 Final  ?   ?  ?  Passed - Patient is not pregnant  ?  ?  Passed - Valid  encounter within last 12 months  ?  Recent Outpatient Visits   ?      ? 2 months ago Appointment canceled by hospital  ? [place], DO  ? 6 months ago Essential (primary) hypertension  ? [place], [name], DO  ? 6 months ago Intertrigo  ? [place], Lauren A, NP  ? 7 months ago Intertrigo  ? [place], NP  ? 9 months ago Alzheimer's dementia without behavioral disturbance, unspecified timing of dementia onset ([place])  ? [place], [place] P, DO  ?  ?  ? ?  ?  ?  ? ? ?

## 2021-05-22 NOTE — Telephone Encounter (Signed)
Requested Prescriptions  ?Pending Prescriptions Disp Refills  ?? hydrOXYzine (ATARAX) 25 MG tablet [Pharmacy Med Name: HYDROXYZINE HCL 25 MG TABLET] 270 tablet 0  ?  Sig: Take 1 tablet (25 mg total) by mouth 3 (three) times daily as needed.  ?  ? Ear, Nose, and Throat:  Antihistamines 2 Failed - 05/21/2021 12:42 PM  ?  ?  Failed - Cr in normal range and within 360 days  ?  Creatinine, Ser  ?Date Value Ref Range Status  ?11/21/2020 1.02 (H) 0.57 - 1.00 mg/dL Final  ?   ?  ?  Passed - Valid encounter within last 12 months  ?  Recent Outpatient Visits   ?      ? 2 months ago Appointment canceled by hospital  ? [facility], [name], DO  ? 6 months ago Essential (primary) hypertension  ? [facility], [name], DO  ? 6 months ago [facility]tr[facility]  ? [facility] [place], Lauren A, NP  ? 7 months ago Intertrigo  ? [facility] [place], Lauren A, NP  ? 9 months ago Alzheimer's dementia without behavioral disturbance, unspecified timing of dementia onset (HCC)  ? [facility], [place] P, DO  ?  ?  ? ?  ?  ?  ? ? ?

## 2021-05-27 ENCOUNTER — Telehealth: Payer: Self-pay | Admitting: Family Medicine

## 2021-05-27 NOTE — Telephone Encounter (Signed)
Patients husband  came into the office and stated that his wife is in an adult daycare and they will not allow her to come home until the provider states that she can. He would like Provider to call and advise. [phone] ?

## 2021-05-29 ENCOUNTER — Telehealth: Payer: Self-pay | Admitting: Family Medicine

## 2021-05-29 NOTE — Telephone Encounter (Signed)
Can we please call and find out what's going on? They shouldn't be able to keep her... ?

## 2021-05-29 NOTE — Telephone Encounter (Signed)
Copied from CRM [id number]. Topic: General - Other ?>> May 29, 2021 11:31 AM Gaetana Michaelis A wrote: ?Reason for CRM: The patient's husband would like to speak directly with Dr Laural Benes when possible  ? ?The patient's husband has concerns that the patient's medications may be ineffective ? ?Please contact further when possible ?

## 2021-06-01 ENCOUNTER — Ambulatory Visit: Payer: Medicare PPO | Admitting: Family Medicine

## 2021-06-01 ENCOUNTER — Encounter: Payer: Self-pay | Admitting: Family Medicine

## 2021-06-01 VITALS — BP 130/70 | HR 83 | Temp 97.8°F | Ht 61.5 in | Wt 185.8 lb

## 2021-06-01 DIAGNOSIS — G309 Alzheimer's disease, unspecified: Secondary | ICD-10-CM | POA: Diagnosis not present

## 2021-06-01 DIAGNOSIS — E782 Mixed hyperlipidemia: Secondary | ICD-10-CM

## 2021-06-01 DIAGNOSIS — R7301 Impaired fasting glucose: Secondary | ICD-10-CM

## 2021-06-01 DIAGNOSIS — R231 Pallor: Secondary | ICD-10-CM

## 2021-06-01 DIAGNOSIS — N3001 Acute cystitis with hematuria: Secondary | ICD-10-CM

## 2021-06-01 DIAGNOSIS — F02B0 Dementia in other diseases classified elsewhere, moderate, without behavioral disturbance, psychotic disturbance, mood disturbance, and anxiety: Secondary | ICD-10-CM

## 2021-06-01 DIAGNOSIS — F324 Major depressive disorder, single episode, in partial remission: Secondary | ICD-10-CM

## 2021-06-01 DIAGNOSIS — E559 Vitamin D deficiency, unspecified: Secondary | ICD-10-CM

## 2021-06-01 DIAGNOSIS — I10 Essential (primary) hypertension: Secondary | ICD-10-CM | POA: Diagnosis not present

## 2021-06-01 DIAGNOSIS — R8290 Unspecified abnormal findings in urine: Secondary | ICD-10-CM | POA: Diagnosis not present

## 2021-06-01 LAB — MICROSCOPIC EXAMINATION

## 2021-06-01 LAB — URINALYSIS, ROUTINE W REFLEX MICROSCOPIC
Bilirubin, UA: NEGATIVE
Glucose, UA: NEGATIVE
Ketones, UA: NEGATIVE
Nitrite, UA: NEGATIVE
Protein,UA: NEGATIVE
Specific Gravity, UA: 1.015 (ref 1.005–1.030)
Urobilinogen, Ur: 0.2 mg/dL (ref 0.2–1.0)
pH, UA: 6 (ref 5.0–7.5)

## 2021-06-01 LAB — BAYER DCA HB A1C WAIVED: HB A1C (BAYER DCA - WAIVED): 5 % (ref 4.8–5.6)

## 2021-06-01 MED ORDER — VENLAFAXINE HCL ER 150 MG PO CP24: 150.0000 mg | ORAL_CAPSULE | Freq: Every day | ORAL | 1 refills | Status: DC

## 2021-06-01 MED ORDER — HYDROCHLOROTHIAZIDE 25 MG PO TABS: 25.0000 mg | ORAL_TABLET | Freq: Every day | ORAL | 1 refills | Status: DC

## 2021-06-01 MED ORDER — ARIPIPRAZOLE 5 MG PO TABS: 5.0000 mg | ORAL_TABLET | Freq: Every day | ORAL | 1 refills | Status: DC

## 2021-06-01 MED ORDER — NITROFURANTOIN MONOHYD MACRO 100 MG PO CAPS: 100.0000 mg | ORAL_CAPSULE | Freq: Two times a day (BID) | ORAL | 0 refills | Status: DC

## 2021-06-01 MED ORDER — BUPROPION HCL ER (XL) 300 MG PO TB24: 300.0000 mg | ORAL_TABLET | Freq: Every day | ORAL | 1 refills | Status: DC

## 2021-06-01 MED ORDER — NAPROXEN 500 MG PO TABS: 500.0000 mg | ORAL_TABLET | Freq: Two times a day (BID) | ORAL | 1 refills | Status: DC

## 2021-06-01 MED ORDER — HYDROXYZINE HCL 25 MG PO TABS: 25.0000 mg | ORAL_TABLET | Freq: Three times a day (TID) | ORAL | 1 refills | Status: DC | PRN

## 2021-06-01 MED ORDER — TRAZODONE HCL 50 MG PO TABS: 50.0000 mg | ORAL_TABLET | Freq: Every day | ORAL | 1 refills | Status: AC
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
## 2021-06-01 NOTE — Assessment & Plan Note (Signed)
Rechecking labs today. Await results. Treat as needed.  °

## 2021-06-01 NOTE — Assessment & Plan Note (Signed)
Possibly worsening. ?due to UTI. Will increase her trazodone and continue current medicine. If aggression not improving with treatment of UTI consider increasing abilify. ?

## 2021-06-01 NOTE — Assessment & Plan Note (Signed)
Refills given. If aggression does not improve with treatment of UTI consider increasing abilify. ?

## 2021-06-01 NOTE — Assessment & Plan Note (Signed)
Under good control on current regimen. Continue current regimen. Continue to monitor. Call with any concerns. Refills given. Labs drawn today.   

## 2021-06-02 ENCOUNTER — Encounter: Payer: Self-pay | Admitting: Family Medicine

## 2021-06-02 ENCOUNTER — Other Ambulatory Visit: Payer: Self-pay | Admitting: Family Medicine

## 2021-06-02 DIAGNOSIS — F0150 Vascular dementia without behavioral disturbance: Secondary | ICD-10-CM | POA: Diagnosis not present

## 2021-06-02 DIAGNOSIS — F329 Major depressive disorder, single episode, unspecified: Secondary | ICD-10-CM | POA: Diagnosis not present

## 2021-06-02 DIAGNOSIS — E559 Vitamin D deficiency, unspecified: Secondary | ICD-10-CM | POA: Diagnosis not present

## 2021-06-02 DIAGNOSIS — E538 Deficiency of other specified B group vitamins: Secondary | ICD-10-CM | POA: Diagnosis not present

## 2021-06-02 DIAGNOSIS — F0280 Dementia in other diseases classified elsewhere without behavioral disturbance: Secondary | ICD-10-CM | POA: Diagnosis not present

## 2021-06-02 DIAGNOSIS — G309 Alzheimer's disease, unspecified: Secondary | ICD-10-CM | POA: Diagnosis not present

## 2021-06-02 DIAGNOSIS — F03918 Unspecified dementia, unspecified severity, with other behavioral disturbance: Secondary | ICD-10-CM | POA: Diagnosis not present

## 2021-06-02 LAB — CBC WITH DIFFERENTIAL/PLATELET
Basophils Absolute: 0.1 10*3/uL (ref 0.0–0.2)
Basos: 1 %
EOS (ABSOLUTE): 0.2 10*3/uL (ref 0.0–0.4)
Eos: 3 %
Hematocrit: 40 % (ref 34.0–46.6)
Hemoglobin: 13 g/dL (ref 11.1–15.9)
Immature Grans (Abs): 0 10*3/uL (ref 0.0–0.1)
Immature Granulocytes: 0 %
Lymphocytes Absolute: 1.5 10*3/uL (ref 0.7–3.1)
Lymphs: 21 %
MCH: 27.3 pg (ref 26.6–33.0)
MCHC: 32.5 g/dL (ref 31.5–35.7)
MCV: 84 fL (ref 79–97)
Monocytes Absolute: 0.7 10*3/uL (ref 0.1–0.9)
Monocytes: 9 %
Neutrophils Absolute: 4.7 10*3/uL (ref 1.4–7.0)
Neutrophils: 66 %
Platelets: 241 10*3/uL (ref 150–450)
RBC: 4.77 x10E6/uL (ref 3.77–5.28)
RDW: 12.4 % (ref 11.7–15.4)
WBC: 7.1 10*3/uL (ref 3.4–10.8)

## 2021-06-02 LAB — COMPREHENSIVE METABOLIC PANEL
ALT: 13 IU/L (ref 0–32)
AST: 13 IU/L (ref 0–40)
Albumin/Globulin Ratio: 2 (ref 1.2–2.2)
Albumin: 4.2 g/dL (ref 3.7–4.7)
Alkaline Phosphatase: 82 IU/L (ref 44–121)
BUN/Creatinine Ratio: 23 (ref 12–28)
BUN: 19 mg/dL (ref 8–27)
Bilirubin Total: 0.2 mg/dL (ref 0.0–1.2)
CO2: 26 mmol/L (ref 20–29)
Calcium: 8.9 mg/dL (ref 8.7–10.3)
Chloride: 104 mmol/L (ref 96–106)
Creatinine, Ser: 0.84 mg/dL (ref 0.57–1.00)
Globulin, Total: 2.1 g/dL (ref 1.5–4.5)
Glucose: 80 mg/dL (ref 70–99)
Potassium: 4.2 mmol/L (ref 3.5–5.2)
Sodium: 141 mmol/L (ref 134–144)
Total Protein: 6.3 g/dL (ref 6.0–8.5)
eGFR: 74 mL/min/{1.73_m2} (ref >59)

## 2021-06-02 LAB — LIPID PANEL W/O CHOL/HDL RATIO
Cholesterol, Total: 199 mg/dL (ref 100–199)
HDL: 47 mg/dL (ref >39)
LDL Chol Calc (NIH): 123 mg/dL — ABNORMAL HIGH (ref 0–99)
Triglycerides: 163 mg/dL — ABNORMAL HIGH (ref 0–149)
VLDL Cholesterol Cal: 29 mg/dL (ref 5–40)

## 2021-06-02 LAB — VITAMIN D 25 HYDROXY (VIT D DEFICIENCY, FRACTURES): Vit D, 25-Hydroxy: 21.5 ng/mL — ABNORMAL LOW (ref 30.0–100.0)

## 2021-06-02 MED ORDER — VITAMIN D (ERGOCALCIFEROL) 1.25 MG (50000 UNIT) PO CAPS: 50000.0000 [IU] | ORAL_CAPSULE | ORAL | 1 refills | Status: DC

## 2021-06-03 LAB — URINE CULTURE

## 2021-06-15 ENCOUNTER — Ambulatory Visit: Payer: Medicare PPO | Admitting: Family Medicine

## 2021-06-17 ENCOUNTER — Ambulatory Visit: Payer: Medicare PPO | Admitting: Family Medicine

## 2021-06-17 ENCOUNTER — Telehealth: Payer: Self-pay | Admitting: Family Medicine

## 2021-06-17 ENCOUNTER — Encounter: Payer: Self-pay | Admitting: Family Medicine

## 2021-06-17 VITALS — BP 127/82 | HR 80 | Temp 98.0°F | Wt 178.0 lb

## 2021-06-17 DIAGNOSIS — G309 Alzheimer's disease, unspecified: Secondary | ICD-10-CM

## 2021-06-17 DIAGNOSIS — J301 Allergic rhinitis due to pollen: Secondary | ICD-10-CM | POA: Diagnosis not present

## 2021-06-17 DIAGNOSIS — F02B0 Dementia in other diseases classified elsewhere, moderate, without behavioral disturbance, psychotic disturbance, mood disturbance, and anxiety: Secondary | ICD-10-CM

## 2021-06-17 NOTE — Progress Notes (Signed)
? ?BP 127/82   Pulse 80   Temp 98 ?F (36.7 ?C)   Wt 178 lb (80.7 kg)   SpO2 100%   BMI 33.09 kg/m?   ? ?Subjective:  ? ? Patient ID: [name] [name], female    DOB: 07/25/1950, 71 y.o.   MRN: [id number] ? ?HPI: ?[name] [name] is a 71 y.o. female ? ?Chief Complaint  ?Patient presents with  ? Depression  ? URI  ?  Patient husband states she is coughing and sneezing.   ? ?UTI has resolved. Feeling much better. Significantly less agitated. Seems less depressed.  ? ?UPPER RESPIRATORY TRACT INFECTION ?Duration: 3-4 days ?Worst symptom: coughing and sneezing ?Fever: no ?Cough: yes ?Shortness of breath: no ?Wheezing: no ?Chest pain: no ?Chest tightness: no ?Chest congestion: no ?Nasal congestion: yes ?Runny nose: yes ?Post nasal drip: yes ?Sneezing: yes ?Sore throat: no ?Swollen glands: no ?Sinus pressure: no ?Headache: no ?Face pain: no ?Toothache: no ?Ear pain: no  ?Ear pressure: no  ?Eyes red/itching:no ?Eye drainage/crusting: no  ?Vomiting: no ?Rash: no ?Fatigue: yes ?Sick contacts: yes ?Strep contacts: no  ?Context: stable ?Recurrent sinusitis: no ?Relief with OTC cold/cough medications: no  ?Treatments attempted: coracitin  ? ?Relevant past medical, surgical, family and social history reviewed and updated as indicated. Interim medical history since our last visit reviewed. ?Allergies and medications reviewed and updated. ? ?Review of Systems  ?Constitutional: Negative.   ?HENT:  Positive for congestion, rhinorrhea and sneezing. Negative for dental problem, drooling, ear discharge, ear pain, facial swelling, hearing loss, mouth sores, nosebleeds, postnasal drip, sinus pressure, sinus pain, sore throat, tinnitus, trouble swallowing and voice change.   ?Respiratory: Negative.    ?Cardiovascular: Negative.   ?Gastrointestinal: Negative.   ?Musculoskeletal: Negative.   ?Neurological: Negative.   ?Psychiatric/Behavioral: Negative.    ? ?Per HPI unless specifically indicated above ? ?   ?Objective:  ?  ?BP 127/82    Pulse 80   Temp 98 ?F (36.7 ?C)   Wt 178 lb (80.7 kg)   SpO2 100%   BMI 33.09 kg/m?   ?Wt Readings from Last 3 Encounters:  ?06/17/21 178 lb (80.7 kg)  ?06/01/21 185 lb 12.8 oz (84.3 kg)  ?11/21/20 172 lb (78 kg)  ?  ?Physical Exam ?Vitals and nursing note reviewed.  ?Constitutional:   ?   General: She is not in acute distress. ?   Appearance: Normal appearance. She is not ill-appearing, toxic-appearing or diaphoretic.  ?HENT:  ?   Head: Normocephalic and atraumatic.  ?   Right Ear: External ear normal.  ?   Left Ear: External ear normal.  ?   Nose: Nose normal.  ?   Mouth/Throat:  ?   Mouth: Mucous membranes are moist.  ?   Pharynx: Oropharynx is clear.  ?Eyes:  ?   General: No scleral icterus.    ?   Right eye: No discharge.     ?   Left eye: No discharge.  ?   Extraocular Movements: Extraocular movements intact.  ?   Conjunctiva/sclera: Conjunctivae normal.  ?   Pupils: Pupils are equal, round, and reactive to light.  ?Cardiovascular:  ?   Rate and Rhythm: Normal rate and regular rhythm.  ?   Pulses: Normal pulses.  ?   Heart sounds: Normal heart sounds. No murmur heard. ?  No friction rub. No gallop.  ?Pulmonary:  ?   Effort: Pulmonary effort is normal. No respiratory distress.  ?   Breath sounds: Normal breath sounds.  No stridor. No wheezing, rhonchi or rales.  ?Chest:  ?   Chest wall: No tenderness.  ?Musculoskeletal:     ?   General: Normal range of motion.  ?   Cervical back: Normal range of motion and neck supple.  ?Skin: ?   General: Skin is warm and dry.  ?   Capillary Refill: Capillary refill takes less than 2 seconds.  ?   Coloration: Skin is not jaundiced or pale.  ?   Findings: No bruising, erythema, lesion or rash.  ?Neurological:  ?   General: No focal deficit present.  ?   Mental Status: She is alert and oriented to person, place, and time. Mental status is at baseline.  ?Psychiatric:     ?   Mood and Affect: Mood normal.     ?   Behavior: Behavior normal.     ?   Thought Content: Thought  content normal.     ?   Judgment: Judgment normal.  ? ? ?Results for orders placed or performed in visit on 06/01/21  ?Urine Culture  ? Specimen: Urine  ? UR  ?Result Value Ref Range  ? Urine Culture, Routine Final report   ? Organism ID, Bacteria Comment   ?Microscopic Examination  ? Urine  ?Result Value Ref Range  ? WBC, UA 0-5 0 - 5 /hpf  ? RBC 0-2 0 - 2 /hpf  ? Epithelial Cells (non renal) 0-10 0 - 10 /hpf  ? Bacteria, UA Moderate (A) None seen/Few  ?Urinalysis, Routine w reflex microscopic  ?Result Value Ref Range  ? Specific Gravity, UA 1.015 1.005 - 1.030  ? pH, UA 6.0 5.0 - 7.5  ? Color, UA Yellow Yellow  ? Appearance Ur Cloudy (A) Clear  ? Leukocytes,UA Trace (A) Negative  ? Protein,UA Negative Negative/Trace  ? Glucose, UA Negative Negative  ? Ketones, UA Negative Negative  ? RBC, UA Trace (A) Negative  ? Bilirubin, UA Negative Negative  ? Urobilinogen, Ur 0.2 0.2 - 1.0 mg/dL  ? Nitrite, UA Negative Negative  ? Microscopic Examination See below:   ?Comprehensive metabolic panel  ?Result Value Ref Range  ? Glucose 80 70 - 99 mg/dL  ? BUN 19 8 - 27 mg/dL  ? Creatinine, Ser 0.84 0.57 - 1.00 mg/dL  ? eGFR 74 >59 mL/min/1.73  ? BUN/Creatinine Ratio 23 12 - 28  ? Sodium 141 134 - 144 mmol/L  ? Potassium 4.2 3.5 - 5.2 mmol/L  ? Chloride 104 96 - 106 mmol/L  ? CO2 26 20 - 29 mmol/L  ? Calcium 8.9 8.7 - 10.3 mg/dL  ? Total Protein 6.3 6.0 - 8.5 g/dL  ? Albumin 4.2 3.7 - 4.7 g/dL  ? Globulin, Total 2.1 1.5 - 4.5 g/dL  ? Albumin/Globulin Ratio 2.0 1.2 - 2.2  ? Bilirubin Total 0.2 0.0 - 1.2 mg/dL  ? Alkaline Phosphatase 82 44 - 121 IU/L  ? AST 13 0 - 40 IU/L  ? ALT 13 0 - 32 IU/L  ?CBC with Differential/Platelet  ?Result Value Ref Range  ? WBC 7.1 3.4 - 10.8 x10E3/uL  ? RBC 4.77 3.77 - 5.28 x10E6/uL  ? Hemoglobin 13.0 11.1 - 15.9 g/dL  ? Hematocrit 40.0 34.0 - 46.6 %  ? MCV 84 79 - 97 fL  ? MCH 27.3 26.6 - 33.0 pg  ? MCHC 32.5 31.5 - 35.7 g/dL  ? RDW 12.4 11.7 - 15.4 %  ? Platelets 241 150 - 450 x10E3/uL  ?  Neutrophils 66  Not Estab. %  ? Lymphs 21 Not Estab. %  ? Monocytes 9 Not Estab. %  ? Eos 3 Not Estab. %  ? Basos 1 Not Estab. %  ? Neutrophils Absolute 4.7 1.4 - 7.0 x10E3/uL  ? Lymphocytes Absolute 1.5 0.7 - 3.1 x10E3/uL  ? Monocytes Absolute 0.7 0.1 - 0.9 x10E3/uL  ? EOS (ABSOLUTE) 0.2 0.0 - 0.4 x10E3/uL  ? Basophils Absolute 0.1 0.0 - 0.2 x10E3/uL  ? Immature Granulocytes 0 Not Estab. %  ? Immature Grans (Abs) 0.0 0.0 - 0.1 x10E3/uL  ?VITAMIN D 25 Hydroxy (Vit-D Deficiency, Fractures)  ?Result Value Ref Range  ? Vit D, 25-Hydroxy 21.5 (L) 30.0 - 100.0 ng/mL  ?Bayer DCA Hb A1c Waived  ?Result Value Ref Range  ? HB A1C (BAYER DCA - WAIVED) 5.0 4.8 - 5.6 %  ?Lipid Panel w/o Chol/HDL Ratio  ?Result Value Ref Range  ? Cholesterol, Total 199 100 - 199 mg/dL  ? Triglycerides 163 (H) 0 - 149 mg/dL  ? HDL 47 >39 mg/dL  ? VLDL Cholesterol Cal 29 5 - 40 mg/dL  ? LDL Chol Calc (NIH) 123 (H) 0 - 99 mg/dL  ? ?   ?Assessment & Plan:  ? ?Problem List Items Addressed This Visit   ? ?  ? Respiratory  ? Allergic rhinitis  ?  Will treat with zyrtec and corecidin. Call with any concerns or if not getting better.  ? ?  ?  ?  ? Nervous and Auditory  ? Dementia ([place]) - Primary  ?  Doing much better with her agitation with treatment of her UTI. Will hold on additional medicine. Continue to monitor. Call with any concerns. Dementia is worsening, making it harder for her husband to care for her. Will get hospice involved for help with personal care.  ? ?  ?  ? Relevant Orders  ? Ambulatory referral to Hospice  ?  ? ?Follow up plan: ?Return in about 3 months (around 09/17/2021). ? ? ? ? ? ?

## 2021-06-17 NOTE — Telephone Encounter (Signed)
Copied from [place] [id number]. Topic: General - Other ?>> Jun 17, 2021  1:58 PM Bayard Beaver wrote: ?Reason for [id number] from [place] called in asking if Dr Wynetta Emery is ok with being attending for pt for hospice. Please call back ?

## 2021-06-17 NOTE — Assessment & Plan Note (Addendum)
Doing much better with her agitation with treatment of her UTI. Will hold on additional medicine. Continue to monitor. Call with any concerns. Dementia is worsening, making it harder for her husband to care for her. Will get hospice involved for help with personal care.  ?

## 2021-06-17 NOTE — Assessment & Plan Note (Signed)
Will treat with zyrtec and corecidin. Call with any concerns or if not getting better.  ?

## 2021-06-18 NOTE — Telephone Encounter (Signed)
yes

## 2021-06-18 NOTE — Telephone Encounter (Signed)
Called and notified Cordelia Pen of Dr. Henriette Combs response.

## 2021-06-25 ENCOUNTER — Other Ambulatory Visit: Payer: Self-pay | Admitting: Family Medicine

## 2021-06-29 NOTE — Telephone Encounter (Signed)
Rx 06/01/21 #90 1RF- RF on file Requested Prescriptions  Pending Prescriptions Disp Refills  . buPROPion (WELLBUTRIN XL) 300 MG 24 hr tablet [Pharmacy Med Name: BUPROPION HCL XL 300 MG TABLET] 90 tablet 0    Sig: Take 1 tablet (300 mg total) by mouth daily.     Psychiatry: Antidepressants - bupropion Passed - 06/25/2021 12:26 PM      Passed - Cr in normal range and within 360 days    Creatinine, Ser  Date Value Ref Range Status  06/01/2021 0.84 0.57 - 1.00 mg/dL Final         Passed - AST in normal range and within 360 days    AST  Date Value Ref Range Status  06/01/2021 13 0 - 40 IU/L Final         Passed - ALT in normal range and within 360 days    ALT  Date Value Ref Range Status  06/01/2021 13 0 - 32 IU/L Final         Passed - Completed PHQ-2 or PHQ-9 in the last 360 days      Passed - Last BP in normal range    BP Readings from Last 1 Encounters:  06/17/21 127/82         Passed - Valid encounter within last 6 months    Recent Outpatient Visits          1 week ago 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, psychotic disturbance, mood disturbance, or anxiety, unspecified timing of dementia onset (HCC)   [facility] [place], [name], DO   4 weeks ago Acute cystitis with hematuria   [facility] [place], [name], DO   4 months ago Appointment canceled by hospital   [facility], [name], DO   7 months ago Essential (primary) hypertension   [facility] [place], [place], DO   8 months ago Intertrigo   [facility], Jake Church, NP      Future Appointments            In 2 months Johnson, Oralia Rud, DO [facility], [facility]

## 2021-07-02 ENCOUNTER — Telehealth: Payer: Self-pay | Admitting: Family Medicine

## 2021-07-02 MED ORDER — LORAZEPAM 0.5 MG PO TABS: 0.2500 mg | ORAL_TABLET | Freq: Three times a day (TID) | ORAL | 2 refills | Status: DC | PRN

## 2021-07-02 NOTE — Telephone Encounter (Signed)
I'm not comfortable with that much medicine for her. I've sent in a lower dose.

## 2021-07-02 NOTE — Telephone Encounter (Signed)
Returned call to [facility] and notified of medication being sent.

## 2021-07-02 NOTE — Telephone Encounter (Signed)
Melonie from [facility] called and stated that the pt was having some anxiety and she stated the pt Needs Larazapam 0.5MG  every 4 hrs as needed for anxiety / please advise and send RX to [facility]

## 2021-07-16 ENCOUNTER — Telehealth: Payer: Self-pay | Admitting: Family Medicine

## 2021-07-16 NOTE — Telephone Encounter (Signed)
Patients husband dropped off [facility] paperwork to be completed by provider. Paperwork was placed In the providers folder for completion. Please call [name] [name] at [phone] when it can be picked up.

## 2021-07-27 NOTE — Telephone Encounter (Signed)
Patient husband is aware paperwork is ready to pick up.

## 2021-09-16 ENCOUNTER — Ambulatory Visit: Payer: Medicare PPO | Admitting: Family Medicine

## 2021-09-23 ENCOUNTER — Ambulatory Visit: Payer: Medicare PPO | Admitting: Family Medicine

## 2021-09-23 ENCOUNTER — Encounter: Payer: Self-pay | Admitting: Family Medicine

## 2021-09-23 VITALS — BP 112/75 | HR 85 | Temp 97.4°F | Wt 168.4 lb

## 2021-09-23 DIAGNOSIS — G309 Alzheimer's disease, unspecified: Secondary | ICD-10-CM | POA: Diagnosis not present

## 2021-09-23 DIAGNOSIS — L98491 Non-pressure chronic ulcer of skin of other sites limited to breakdown of skin: Secondary | ICD-10-CM | POA: Diagnosis not present

## 2021-09-23 DIAGNOSIS — R634 Abnormal weight loss: Secondary | ICD-10-CM | POA: Diagnosis not present

## 2021-09-23 DIAGNOSIS — N3000 Acute cystitis without hematuria: Secondary | ICD-10-CM | POA: Diagnosis not present

## 2021-09-23 DIAGNOSIS — F02B0 Dementia in other diseases classified elsewhere, moderate, without behavioral disturbance, psychotic disturbance, mood disturbance, and anxiety: Secondary | ICD-10-CM

## 2021-09-23 DIAGNOSIS — R300 Dysuria: Secondary | ICD-10-CM

## 2021-09-23 LAB — URINALYSIS, ROUTINE W REFLEX MICROSCOPIC
Bilirubin, UA: NEGATIVE
Glucose, UA: NEGATIVE
Ketones, UA: NEGATIVE
Nitrite, UA: NEGATIVE
Specific Gravity, UA: 1.02 (ref 1.005–1.030)
Urobilinogen, Ur: 0.2 mg/dL (ref 0.2–1.0)
pH, UA: 6 (ref 5.0–7.5)

## 2021-09-23 LAB — MICROSCOPIC EXAMINATION

## 2021-09-23 MED ORDER — AMOXICILLIN-POT CLAVULANATE 875-125 MG PO TABS: 1.0000 | ORAL_TABLET | Freq: Two times a day (BID) | ORAL | 0 refills | Status: DC

## 2021-09-23 MED ORDER — MUPIROCIN CALCIUM 2 % EX CREA: 1.0000 | TOPICAL_CREAM | Freq: Two times a day (BID) | CUTANEOUS | 0 refills | Status: DC

## 2021-09-23 MED ORDER — NYSTATIN 100000 UNIT/GM EX POWD: 1.0000 | Freq: Three times a day (TID) | CUTANEOUS | 0 refills | Status: DC

## 2021-09-23 MED ORDER — ARIPIPRAZOLE 5 MG PO TABS: 5.0000 mg | ORAL_TABLET | Freq: Every day | ORAL | 1 refills | Status: DC

## 2021-09-23 NOTE — Assessment & Plan Note (Signed)
Husband is dressing it and it's improving. Continue treatment.

## 2021-09-23 NOTE — Assessment & Plan Note (Addendum)
Husband is considering placement. We will fill out paperwork as soon as he makes a decision. More agitated at night. We will increase her abilify to 5-10mg  as needed. Call with any concerns.

## 2021-09-23 NOTE — Progress Notes (Signed)
BP 112/75   Pulse 85   Temp (!) 97.4 F (36.3 C)   Wt 168 lb 6.4 oz (76.4 kg)   SpO2 96%   BMI 31.31 kg/m    Subjective:    Patient ID: [name] [name], female    DOB: 08/09/1950, 71 y.o.   MRN: [id number]  HPI: [name] [name] is a 71 y.o. female  Chief Complaint  Patient presents with   Dementia   Urine odor    Patient husband states the patients urine smells real strong, patient has been scratching herself and states it was hot and burning when she uses the bathroom.    Sore    Patient husband states she has a sore on the right side of her fold. Husband been using cream and covering it with a  bandage. Has noticed it looks better since last week.    Irritability    Patient husband states the patient has been more agitated mainly in the evenings.    Low appetite    Patient husband states she does not have much of an appetite anymore, has lost 10 pounds since she was last here   URINARY SYMPTOMS Duration: a few days Dysuria: burning Urinary frequency: yes Urgency: yes Small volume voids: yes Symptom severity:  moderate Urinary incontinence: yes Foul odor: yes Hematuria: no Abdominal pain: no Back pain: yes Suprapubic pain/pressure: no Flank pain: no Fever:  no Vomiting: no Relief with cranberry juice: no Relief with pyridium: no Status: worse Previous urinary tract infection: yes Recurrent urinary tract infection: no History of sexually transmitted disease: no Vaginal discharge: no Treatments attempted: increasing fluids    Relevant past medical, surgical, family and social history reviewed and updated as indicated. Interim medical history since our last visit reviewed. Allergies and medications reviewed and updated.  Review of Systems  Constitutional: Negative.   Respiratory: Negative.    Cardiovascular: Negative.   Gastrointestinal: Negative.   Genitourinary:  Positive for dysuria, frequency and urgency. Negative for decreased urine volume, difficulty  urinating, dyspareunia, enuresis, flank pain, genital sores, hematuria, menstrual problem, pelvic pain, vaginal bleeding, vaginal discharge and vaginal pain.  Musculoskeletal: Negative.   Skin: Negative.   Neurological: Negative.   Psychiatric/Behavioral:  Positive for agitation. Negative for behavioral problems, confusion, decreased concentration, dysphoric mood, hallucinations, self-injury, sleep disturbance and suicidal ideas. The patient is nervous/anxious. The patient is not hyperactive.     Per HPI unless specifically indicated above     Objective:    BP 112/75   Pulse 85   Temp (!) 97.4 F (36.3 C)   Wt 168 lb 6.4 oz (76.4 kg)   SpO2 96%   BMI 31.31 kg/m   Wt Readings from Last 3 Encounters:  09/23/21 168 lb 6.4 oz (76.4 kg)  06/17/21 178 lb (80.7 kg)  06/01/21 185 lb 12.8 oz (84.3 kg)    Physical Exam Vitals and nursing note reviewed.  Constitutional:      General: She is not in acute distress.    Appearance: Normal appearance. She is obese. She is not ill-appearing, toxic-appearing or diaphoretic.  HENT:     Head: Normocephalic and atraumatic.     Right Ear: External ear normal.     Left Ear: External ear normal.     Nose: Nose normal.     Mouth/Throat:     Mouth: Mucous membranes are moist.     Pharynx: Oropharynx is clear.  Eyes:     General: No scleral icterus.  Right eye: No discharge.        Left eye: No discharge.     Extraocular Movements: Extraocular movements intact.     Conjunctiva/sclera: Conjunctivae normal.     Pupils: Pupils are equal, round, and reactive to light.  Cardiovascular:     Rate and Rhythm: Normal rate and regular rhythm.     Pulses: Normal pulses.     Heart sounds: Normal heart sounds. No murmur heard.    No friction rub. No gallop.  Pulmonary:     Effort: Pulmonary effort is normal. No respiratory distress.     Breath sounds: Normal breath sounds. No stridor. No wheezing, rhonchi or rales.  Chest:     Chest wall: No  tenderness.  Musculoskeletal:        General: Normal range of motion.     Cervical back: Normal range of motion and neck supple.  Skin:    General: Skin is warm and dry.     Capillary Refill: Capillary refill takes less than 2 seconds.     Coloration: Skin is pale. Skin is not jaundiced.     Findings: No bruising, erythema, lesion or rash.  Neurological:     General: No focal deficit present.     Mental Status: She is alert and oriented to person, place, and time. Mental status is at baseline.  Psychiatric:        Mood and Affect: Mood normal.        Behavior: Behavior normal.        Thought Content: Thought content normal.        Judgment: Judgment normal.     Results for orders placed or performed in visit on 06/01/21  Urine Culture   Specimen: Urine   UR  Result Value Ref Range   Urine Culture, Routine Final report    Organism ID, Bacteria Comment   Microscopic Examination   Urine  Result Value Ref Range   WBC, UA 0-5 0 - 5 /hpf   RBC, Urine 0-2 0 - 2 /hpf   Epithelial Cells (non renal) 0-10 0 - 10 /hpf   Bacteria, UA Moderate (A) None seen/Few  Urinalysis, Routine w reflex microscopic  Result Value Ref Range   Specific Gravity, UA 1.015 1.005 - 1.030   pH, UA 6.0 5.0 - 7.5   Color, UA Yellow Yellow   Appearance Ur Cloudy (A) Clear   Leukocytes,UA Trace (A) Negative   Protein,UA Negative Negative/Trace   Glucose, UA Negative Negative   Ketones, UA Negative Negative   RBC, UA Trace (A) Negative   Bilirubin, UA Negative Negative   Urobilinogen, Ur 0.2 0.2 - 1.0 mg/dL   Nitrite, UA Negative Negative   Microscopic Examination See below:   Comprehensive metabolic panel  Result Value Ref Range   Glucose 80 70 - 99 mg/dL   BUN 19 8 - 27 mg/dL   Creatinine, Ser 0.84 0.57 - 1.00 mg/dL   eGFR 74 >59 mL/min/1.73   BUN/Creatinine Ratio 23 12 - 28   Sodium 141 134 - 144 mmol/L   Potassium 4.2 3.5 - 5.2 mmol/L   Chloride 104 96 - 106 mmol/L   CO2 26 20 - 29 mmol/L    Calcium 8.9 8.7 - 10.3 mg/dL   Total Protein 6.3 6.0 - 8.5 g/dL   Albumin 4.2 3.7 - 4.7 g/dL   Globulin, Total 2.1 1.5 - 4.5 g/dL   Albumin/Globulin Ratio 2.0 1.2 - 2.2   Bilirubin Total 0.2 0.0 - 1.2  mg/dL   Alkaline Phosphatase 82 44 - 121 IU/L   AST 13 0 - 40 IU/L   ALT 13 0 - 32 IU/L  CBC with Differential/Platelet  Result Value Ref Range   WBC 7.1 3.4 - 10.8 x10E3/uL   RBC 4.77 3.77 - 5.28 x10E6/uL   Hemoglobin 13.0 11.1 - 15.9 g/dL   Hematocrit 40.0 34.0 - 46.6 %   MCV 84 79 - 97 fL   MCH 27.3 26.6 - 33.0 pg   MCHC 32.5 31.5 - 35.7 g/dL   RDW 12.4 11.7 - 15.4 %   Platelets 241 150 - 450 x10E3/uL   Neutrophils 66 Not Estab. %   Lymphs 21 Not Estab. %   Monocytes 9 Not Estab. %   Eos 3 Not Estab. %   Basos 1 Not Estab. %   Neutrophils Absolute 4.7 1.4 - 7.0 x10E3/uL   Lymphocytes Absolute 1.5 0.7 - 3.1 x10E3/uL   Monocytes Absolute 0.7 0.1 - 0.9 x10E3/uL   EOS (ABSOLUTE) 0.2 0.0 - 0.4 x10E3/uL   Basophils Absolute 0.1 0.0 - 0.2 x10E3/uL   Immature Granulocytes 0 Not Estab. %   Immature Grans (Abs) 0.0 0.0 - 0.1 x10E3/uL  VITAMIN D 25 Hydroxy (Vit-D Deficiency, Fractures)  Result Value Ref Range   Vit D, 25-Hydroxy 21.5 (L) 30.0 - 100.0 ng/mL  Bayer DCA Hb A1c Waived  Result Value Ref Range   HB A1C (BAYER DCA - WAIVED) 5.0 4.8 - 5.6 %  Lipid Panel w/o Chol/HDL Ratio  Result Value Ref Range   Cholesterol, Total 199 100 - 199 mg/dL   Triglycerides 163 (H) 0 - 149 mg/dL   HDL 47 >39 mg/dL   VLDL Cholesterol Cal 29 5 - 40 mg/dL   LDL Chol Calc (NIH) 123 (H) 0 - 99 mg/dL      Assessment & Plan:   Problem List Items Addressed This Visit       Nervous and Auditory   Dementia [facility])    Husband is considering placement. We will fill out paperwork as soon as he makes a decision. More agitated at night. We will increase her abilify to 5-95m as needed. Call with any concerns.       Relevant Medications   ARIPiprazole (ABILIFY) 5 MG tablet     Musculoskeletal and  Integument   S[place] ulcer, limited to breakdown of skin [place]    Husband is dressing it and it's improving. Continue treatment.      Other Visit Diagnoses     Acute cystitis without hematuria    -  Primary   Will treat with augmentin. Await culture. call with any concerns.    Relevant Orders   Urine Culture   Weight loss       Likely due to smaller meals. Will check labs. No significant concern at this time. Continue to monitor.   Relevant Orders   Comprehensive metabolic panel   TSH   VITAMIN D 25 Hydroxy (Vit-D Deficiency, Fractures)   CBC with Differential/Platelet   Dysuria       Relevant Orders   Urinalysis, Routine w reflex microscopic        Follow up plan: Return in about 3 months (around 12/24/2021).

## 2021-10-31 ENCOUNTER — Other Ambulatory Visit (INDEPENDENT_AMBULATORY_CARE_PROVIDER_SITE_OTHER): Payer: Medicare PPO | Admitting: Family Medicine

## 2021-10-31 ENCOUNTER — Other Ambulatory Visit: Payer: Self-pay | Admitting: Family Medicine

## 2021-10-31 DIAGNOSIS — M810 Age-related osteoporosis without current pathological fracture: Secondary | ICD-10-CM

## 2021-10-31 DIAGNOSIS — Z9189 Other specified personal risk factors, not elsewhere classified: Secondary | ICD-10-CM

## 2021-10-31 DIAGNOSIS — Z87828 Personal history of other (healed) physical injury and trauma: Secondary | ICD-10-CM

## 2021-10-31 DIAGNOSIS — F02B0 Dementia in other diseases classified elsewhere, moderate, without behavioral disturbance, psychotic disturbance, mood disturbance, and anxiety: Secondary | ICD-10-CM | POA: Diagnosis not present

## 2021-10-31 DIAGNOSIS — G309 Alzheimer's disease, unspecified: Secondary | ICD-10-CM

## 2021-10-31 DIAGNOSIS — E782 Mixed hyperlipidemia: Secondary | ICD-10-CM

## 2021-10-31 DIAGNOSIS — N3281 Overactive bladder: Secondary | ICD-10-CM

## 2021-10-31 DIAGNOSIS — N952 Postmenopausal atrophic vaginitis: Secondary | ICD-10-CM

## 2021-10-31 DIAGNOSIS — H9192 Unspecified hearing loss, left ear: Secondary | ICD-10-CM

## 2021-10-31 DIAGNOSIS — L304 Erythema intertrigo: Secondary | ICD-10-CM

## 2021-10-31 DIAGNOSIS — L98491 Non-pressure chronic ulcer of skin of other sites limited to breakdown of skin: Secondary | ICD-10-CM

## 2021-10-31 DIAGNOSIS — I10 Essential (primary) hypertension: Secondary | ICD-10-CM

## 2021-10-31 DIAGNOSIS — J301 Allergic rhinitis due to pollen: Secondary | ICD-10-CM

## 2021-10-31 DIAGNOSIS — R7301 Impaired fasting glucose: Secondary | ICD-10-CM

## 2021-10-31 DIAGNOSIS — F324 Major depressive disorder, single episode, in partial remission: Secondary | ICD-10-CM

## 2021-10-31 DIAGNOSIS — E559 Vitamin D deficiency, unspecified: Secondary | ICD-10-CM

## 2021-10-31 DIAGNOSIS — G4733 Obstructive sleep apnea (adult) (pediatric): Secondary | ICD-10-CM

## 2021-10-31 NOTE — Progress Notes (Signed)
Received home health orders orders from [facility]. Start of care 06/17/21.   Certification and orders from 09/16/21 through 12/14/21 are reviewed, signed and faxed back to hospice company.  Need of intermittent skilled services at home: Hospice care for monitioring and comfort for life expectancy of less than 6 months.  The hospice health care plan has been established by me and will be reviewed and updated as needed to maximize patient comfort.  I certify that all hospice services have been and will be furnished to the patient while under my care.  Face-to-face encounter in which the need for hospice services was established: 06/17/21  Patient is receiving home health services for the following diagnoses: Problem List Items Addressed This Visit       Cardiovascular and Mediastinum   Essential (primary) hypertension     Respiratory   Allergic rhinitis   OSA (obstructive sleep apnea)     Endocrine   Impaired fasting glucose     Nervous and Auditory   Hearing loss of left ear   Dementia ([place]) - Primary     Musculoskeletal and Integument   Intertrigo   Age-related osteoporosis without current pathological fracture   Skin ulcer, limited to breakdown of skin (HCC)     Genitourinary   Overactive bladder   Atrophic vaginitis     Other   Depression   Hyperlipidemia   History of heat stroke   Vitamin D deficiency   Other specified personal risk factors, not elsewhere classified     Park Liter, DO

## 2021-10-31 NOTE — Progress Notes (Signed)
This encounter was created in error - please disregard.

## 2021-11-18 ENCOUNTER — Other Ambulatory Visit: Payer: Medicare PPO

## 2021-11-18 ENCOUNTER — Telehealth: Payer: Self-pay | Admitting: Family Medicine

## 2021-11-18 DIAGNOSIS — Z111 Encounter for screening for respiratory tuberculosis: Secondary | ICD-10-CM

## 2021-11-18 NOTE — Telephone Encounter (Signed)
Caller checking on the status of FL2 form faxed to [phone] on 11/16/2021. Time is sensitive and will re fax form today, please note when received. Caller would like form faxed back as soon as possible.

## 2021-11-18 NOTE — Telephone Encounter (Signed)
PT's husband is here wanting to bring her in for quantiferon lab draw, they want to put her in an assisted living facility.  Can you put the order in?

## 2021-11-18 NOTE — Telephone Encounter (Signed)
Placed forms in provider folder.

## 2021-11-18 NOTE — Telephone Encounter (Signed)
Patient scheduled for this afternoon for Quantiferon gold TB test

## 2021-11-18 NOTE — Telephone Encounter (Signed)
Order is in.

## 2021-11-21 LAB — QUANTIFERON-TB GOLD PLUS
QuantiFERON Mitogen Value: >10 IU/mL
QuantiFERON Nil Value: 0.03 IU/mL
QuantiFERON TB1 Ag Value: 0.03 IU/mL
QuantiFERON TB2 Ag Value: 0.06 IU/mL
QuantiFERON-TB Gold Plus: NEGATIVE

## 2021-11-24 NOTE — Telephone Encounter (Signed)
[name] [name] called for status update, seeking follow up from paperwork she sent over on the 16th. The family is anxious to move forward however they need this paperwork first

## 2021-11-24 NOTE — Telephone Encounter (Signed)
Form has been completed and faxed.

## 2021-11-26 NOTE — Telephone Encounter (Signed)
Noted, leave for Dr. Johnson review. 

## 2021-11-26 NOTE — Telephone Encounter (Signed)
Amber from [place] [facility], states received order but it cant just say 1-2 tabs daily, it needs to say something like 1 tab daily or 2 tabs daily specifically.fx is [phone]

## 2021-11-26 NOTE — Telephone Encounter (Signed)
Placed in provider folder, please specify dosage for Aripiprazole 5MG 

## 2021-11-27 NOTE — Telephone Encounter (Signed)
Called and let Amber know that we have gotten her message and that we are waiting on Dr. Wynetta Emery to return to office as she was out yesterday and today. She states she will hold off on ordering the patient's meds until she hears back.

## 2021-11-27 NOTE — Telephone Encounter (Signed)
Amber calling again for the med list on FL2. Amber states she needs asap b/c pt is moving in Monday and she needs to order her meds today. Thanks

## 2021-11-27 NOTE — Telephone Encounter (Signed)
[name] [name] is calling again from [facility] calling to let Dr. Wynetta Emery know she needs an update on this today. Pt is moving in Monday. Needs today so that medications can be order. Cb- [phone]

## 2021-11-30 ENCOUNTER — Telehealth: Payer: Self-pay | Admitting: Family Medicine

## 2021-11-30 NOTE — Telephone Encounter (Signed)
[name] [name] calling from [employer] [facility] is calling to report that the pt will be moving to [place] the pt will remain on Hospice Care. CB- [phone]

## 2021-11-30 NOTE — Telephone Encounter (Signed)
Corrected medication list faxed, Amber confirmed fax received. Nothing further needed.

## 2022-04-09 ENCOUNTER — Telehealth: Payer: Self-pay | Admitting: Family Medicine

## 2022-04-09 NOTE — Telephone Encounter (Signed)
Contacted [name] [name] to schedule their annual wellness visit. Patient declined to schedule AWV at this time.Per spouse patient lives in facility at this time.   Sherol Dade; [facility] Guide [facility] Direct Dial: [phone]

## 2022-05-06 DIAGNOSIS — F331 Major depressive disorder, recurrent, moderate: Secondary | ICD-10-CM | POA: Diagnosis not present

## 2022-05-06 DIAGNOSIS — F411 Generalized anxiety disorder: Secondary | ICD-10-CM | POA: Diagnosis not present

## 2022-08-02 DEATH — deceased
